# Patient Record
Sex: Female | Born: 1960 | Race: White | Hispanic: No | Smoking: Former smoker
Health system: Southern US, Community
[De-identification: ages and names within clinical notes are randomized; demographics above are authoritative.]

## PROBLEM LIST (undated history)

## (undated) DIAGNOSIS — L309 Dermatitis, unspecified: Secondary | ICD-10-CM

## (undated) DIAGNOSIS — M8580 Other specified disorders of bone density and structure, unspecified site: Secondary | ICD-10-CM

## (undated) DIAGNOSIS — R739 Hyperglycemia, unspecified: Secondary | ICD-10-CM

## (undated) DIAGNOSIS — L4050 Arthropathic psoriasis, unspecified: Secondary | ICD-10-CM

## (undated) DIAGNOSIS — B019 Varicella without complication: Secondary | ICD-10-CM

## (undated) DIAGNOSIS — Z87891 Personal history of nicotine dependence: Secondary | ICD-10-CM

## (undated) DIAGNOSIS — M67472 Ganglion, left ankle and foot: Secondary | ICD-10-CM

## (undated) DIAGNOSIS — M069 Rheumatoid arthritis, unspecified: Secondary | ICD-10-CM

## (undated) DIAGNOSIS — Z8619 Personal history of other infectious and parasitic diseases: Secondary | ICD-10-CM

## (undated) DIAGNOSIS — J209 Acute bronchitis, unspecified: Secondary | ICD-10-CM

## (undated) DIAGNOSIS — M810 Age-related osteoporosis without current pathological fracture: Secondary | ICD-10-CM

## (undated) DIAGNOSIS — B029 Zoster without complications: Secondary | ICD-10-CM

## (undated) DIAGNOSIS — B269 Mumps without complication: Secondary | ICD-10-CM

## (undated) DIAGNOSIS — M1990 Unspecified osteoarthritis, unspecified site: Secondary | ICD-10-CM

## (undated) DIAGNOSIS — I10 Essential (primary) hypertension: Secondary | ICD-10-CM

## (undated) HISTORY — PX: WISDOM TOOTH EXTRACTION: SHX21

## (undated) HISTORY — DX: Age-related osteoporosis without current pathological fracture: M81.0

## (undated) HISTORY — DX: Arthropathic psoriasis, unspecified: L40.50

## (undated) HISTORY — DX: Acute bronchitis, unspecified: J20.9

## (undated) HISTORY — DX: Zoster without complications: B02.9

## (undated) HISTORY — DX: Varicella without complication: B01.9

## (undated) HISTORY — DX: Mumps without complication: B26.9

## (undated) HISTORY — DX: Personal history of other infectious and parasitic diseases: Z86.19

## (undated) HISTORY — DX: Ganglion, left ankle and foot: M67.472

## (undated) HISTORY — PX: OTHER SURGICAL HISTORY: SHX169

## (undated) HISTORY — DX: Rheumatoid arthritis, unspecified: M06.9

## (undated) HISTORY — DX: Personal history of nicotine dependence: Z87.891

## (undated) HISTORY — DX: Dermatitis, unspecified: L30.9

## (undated) HISTORY — DX: Hyperglycemia, unspecified: R73.9

## (undated) HISTORY — DX: Morbid (severe) obesity due to excess calories: E66.01

## (undated) HISTORY — DX: Other specified disorders of bone density and structure, unspecified site: M85.80

---

## 1998-05-22 LAB — HM MAMMOGRAPHY

## 2000-08-27 ENCOUNTER — Ambulatory Visit (HOSPITAL_COMMUNITY): Admission: RE | Admit: 2000-08-27 | Discharge: 2000-08-27 | Payer: Self-pay | Admitting: Internal Medicine

## 2000-08-27 ENCOUNTER — Encounter: Payer: Self-pay | Admitting: Internal Medicine

## 2000-10-24 ENCOUNTER — Other Ambulatory Visit: Admission: RE | Admit: 2000-10-24 | Discharge: 2000-10-24 | Payer: Self-pay | Admitting: Gynecology

## 2000-11-15 ENCOUNTER — Encounter: Admission: RE | Admit: 2000-11-15 | Discharge: 2000-11-15 | Payer: Self-pay | Admitting: Neurosurgery

## 2000-11-15 ENCOUNTER — Encounter: Payer: Self-pay | Admitting: Neurosurgery

## 2000-12-03 ENCOUNTER — Encounter: Payer: Self-pay | Admitting: Neurosurgery

## 2000-12-03 ENCOUNTER — Encounter: Admission: RE | Admit: 2000-12-03 | Discharge: 2000-12-03 | Payer: Self-pay | Admitting: Neurosurgery

## 2000-12-17 ENCOUNTER — Encounter: Admission: RE | Admit: 2000-12-17 | Discharge: 2000-12-17 | Payer: Self-pay | Admitting: Neurosurgery

## 2000-12-17 ENCOUNTER — Encounter: Payer: Self-pay | Admitting: Neurosurgery

## 2001-03-11 ENCOUNTER — Ambulatory Visit (HOSPITAL_COMMUNITY): Admission: RE | Admit: 2001-03-11 | Discharge: 2001-03-11 | Payer: Self-pay | Admitting: Internal Medicine

## 2001-03-11 ENCOUNTER — Encounter: Payer: Self-pay | Admitting: Internal Medicine

## 2001-05-22 HISTORY — PX: BACK SURGERY: SHX140

## 2001-12-18 ENCOUNTER — Other Ambulatory Visit: Admission: RE | Admit: 2001-12-18 | Discharge: 2001-12-18 | Payer: Self-pay | Admitting: Gynecology

## 2002-05-27 ENCOUNTER — Ambulatory Visit (HOSPITAL_COMMUNITY): Admission: RE | Admit: 2002-05-27 | Discharge: 2002-05-27 | Payer: Self-pay | Admitting: *Deleted

## 2002-05-27 ENCOUNTER — Encounter (INDEPENDENT_AMBULATORY_CARE_PROVIDER_SITE_OTHER): Payer: Self-pay | Admitting: *Deleted

## 2003-03-09 ENCOUNTER — Encounter: Admission: RE | Admit: 2003-03-09 | Discharge: 2003-03-09 | Payer: Self-pay | Admitting: Internal Medicine

## 2003-03-09 ENCOUNTER — Encounter: Payer: Self-pay | Admitting: Internal Medicine

## 2003-05-14 ENCOUNTER — Encounter: Admission: RE | Admit: 2003-05-14 | Discharge: 2003-05-14 | Payer: Self-pay | Admitting: Orthopedic Surgery

## 2003-05-27 ENCOUNTER — Encounter: Admission: RE | Admit: 2003-05-27 | Discharge: 2003-05-27 | Payer: Self-pay | Admitting: Orthopedic Surgery

## 2003-06-18 ENCOUNTER — Encounter: Admission: RE | Admit: 2003-06-18 | Discharge: 2003-06-18 | Payer: Self-pay | Admitting: Orthopedic Surgery

## 2005-05-22 LAB — HM PAP SMEAR

## 2006-02-16 ENCOUNTER — Ambulatory Visit: Payer: Self-pay | Admitting: Internal Medicine

## 2006-05-13 ENCOUNTER — Emergency Department (HOSPITAL_COMMUNITY): Admission: EM | Admit: 2006-05-13 | Discharge: 2006-05-13 | Payer: Self-pay | Admitting: Emergency Medicine

## 2006-05-14 ENCOUNTER — Observation Stay (HOSPITAL_COMMUNITY): Admission: RE | Admit: 2006-05-14 | Discharge: 2006-05-16 | Payer: Self-pay | Admitting: Neurosurgery

## 2006-06-12 ENCOUNTER — Encounter: Admission: RE | Admit: 2006-06-12 | Discharge: 2006-06-12 | Payer: Self-pay | Admitting: Neurosurgery

## 2006-10-17 ENCOUNTER — Ambulatory Visit: Payer: Self-pay | Admitting: Internal Medicine

## 2007-04-24 ENCOUNTER — Telehealth (INDEPENDENT_AMBULATORY_CARE_PROVIDER_SITE_OTHER): Payer: Self-pay | Admitting: *Deleted

## 2007-04-25 ENCOUNTER — Ambulatory Visit: Payer: Self-pay | Admitting: Internal Medicine

## 2007-04-25 ENCOUNTER — Encounter: Payer: Self-pay | Admitting: Internal Medicine

## 2007-04-25 DIAGNOSIS — M545 Low back pain, unspecified: Secondary | ICD-10-CM | POA: Insufficient documentation

## 2007-04-25 DIAGNOSIS — M069 Rheumatoid arthritis, unspecified: Secondary | ICD-10-CM

## 2007-04-25 DIAGNOSIS — M8560 Other cyst of bone, unspecified site: Secondary | ICD-10-CM | POA: Insufficient documentation

## 2007-06-23 ENCOUNTER — Encounter: Admission: RE | Admit: 2007-06-23 | Discharge: 2007-06-23 | Payer: Self-pay | Admitting: Neurosurgery

## 2007-07-12 ENCOUNTER — Telehealth: Payer: Self-pay | Admitting: Internal Medicine

## 2007-07-12 ENCOUNTER — Ambulatory Visit: Payer: Self-pay | Admitting: Internal Medicine

## 2007-07-12 DIAGNOSIS — M26609 Unspecified temporomandibular joint disorder, unspecified side: Secondary | ICD-10-CM | POA: Insufficient documentation

## 2007-11-21 ENCOUNTER — Telehealth: Payer: Self-pay | Admitting: Internal Medicine

## 2008-04-20 ENCOUNTER — Telehealth (INDEPENDENT_AMBULATORY_CARE_PROVIDER_SITE_OTHER): Payer: Self-pay | Admitting: *Deleted

## 2008-05-28 ENCOUNTER — Encounter: Payer: Self-pay | Admitting: Internal Medicine

## 2009-01-19 ENCOUNTER — Telehealth: Payer: Self-pay | Admitting: Internal Medicine

## 2009-06-25 ENCOUNTER — Encounter: Payer: Self-pay | Admitting: Internal Medicine

## 2009-07-08 ENCOUNTER — Encounter: Payer: Self-pay | Admitting: Internal Medicine

## 2009-08-25 ENCOUNTER — Encounter: Payer: Self-pay | Admitting: Internal Medicine

## 2009-08-31 ENCOUNTER — Ambulatory Visit: Payer: Self-pay | Admitting: Internal Medicine

## 2009-08-31 DIAGNOSIS — F418 Other specified anxiety disorders: Secondary | ICD-10-CM

## 2009-08-31 DIAGNOSIS — F4310 Post-traumatic stress disorder, unspecified: Secondary | ICD-10-CM | POA: Insufficient documentation

## 2009-08-31 DIAGNOSIS — H8109 Meniere's disease, unspecified ear: Secondary | ICD-10-CM | POA: Insufficient documentation

## 2009-08-31 HISTORY — DX: Morbid (severe) obesity due to excess calories: E66.01

## 2009-09-01 ENCOUNTER — Telehealth: Payer: Self-pay | Admitting: Internal Medicine

## 2009-10-08 ENCOUNTER — Ambulatory Visit: Payer: Self-pay | Admitting: Internal Medicine

## 2009-10-08 DIAGNOSIS — Z87891 Personal history of nicotine dependence: Secondary | ICD-10-CM | POA: Insufficient documentation

## 2009-10-08 HISTORY — DX: Personal history of nicotine dependence: Z87.891

## 2009-10-14 ENCOUNTER — Telehealth: Payer: Self-pay | Admitting: Internal Medicine

## 2009-12-16 ENCOUNTER — Telehealth: Payer: Self-pay | Admitting: Internal Medicine

## 2009-12-16 ENCOUNTER — Ambulatory Visit: Payer: Self-pay | Admitting: Internal Medicine

## 2010-06-19 LAB — CONVERTED CEMR LAB
Cholesterol: 179 mg/dL (ref 0–200)
HDL: 57.7 mg/dL (ref >39.00)
LDL Cholesterol: 90 mg/dL (ref 0–99)
TSH: 0.51 microintl units/mL (ref 0.35–5.50)
Total CHOL/HDL Ratio: 3
Triglycerides: 157 mg/dL — ABNORMAL HIGH (ref 0.0–149.0)
VLDL: 31.4 mg/dL (ref 0.0–40.0)

## 2010-06-21 NOTE — Letter (Signed)
Summary: [facility]  [facility]   Imported By: Sherian Rein 07/23/2009 07:45:57  _____________________________________________________________________  External Attachment:    Type:   Image     Comment:   External Document
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
## 2010-06-21 NOTE — Progress Notes (Signed)
  Phone Note Call from Patient Call back at Work Phone [phone] Call back at [phone]    Summary of Call: Pt c/o exhaustion since increase in BP medication.  Initial call taken by: Lamar Sprinkles, CMA,  Oct 14, 2009 5:38 PM  Follow-up for Phone Call        unusual for diltiazem to cause fatigue but possible. Stop med and see if symptoms improve.  Follow-up by: Jacques Navy MD,  Oct 14, 2009 6:43 PM  Additional Follow-up for Phone Call Additional follow up Details #1::        left vm on cell # Additional Follow-up by: Lamar Sprinkles, CMA,  Oct 15, 2009 8:42 AM

## 2010-06-21 NOTE — Assessment & Plan Note (Signed)
Summary: tick bite/swollen, red, hot/SD   Vital Signs:  Patient profile:   50 year old female Height:      69 inches Weight:      242 pounds BMI:     35.87 O2 Sat:      97 % on Room air Temp:     97.8 degrees F oral Pulse rate:   71 / minute BP sitting:   138 / 90  (left arm) Cuff size:   regular  Vitals Entered By: Bill Salinas CMA (December 16, 2009 4:34 PM)  O2 Flow:  Room air CC: pt here for evaluation of tick bite behind right knee/ ab   Primary Care Provider:  Wasyl Dornfeld  CC:  pt here for evaluation of tick bite behind right knee/ ab.  History of Present Illness: patient presents for evaluation of an area of erythema at the site of a tick bite. she reports finding a tick on her leg. By her report the tick had not fed. She feel she removed the entire tick. Several days latter there is an area of erythema at the bit site that is solid red without a cleared center. The area is not painful. She has had no fever, headache, myalgias.   Current Medications (verified): 1)  Hydrochlorothiazide 25 Mg Tabs (Hydrochlorothiazide) .... Take 1 Tablet By Mouth Once A Day 2)  Plaquenil 200 Mg  Tabs (Hydroxychloroquine Sulfate) .... Take 1 Tablet By Mouth Two Times A Day 3)  Vicodin 5-500 Mg  Tabs (Hydrocodone-Acetaminophen) .... As Needed 4)  Humira Inj .... Weekly 5)  Ventolin Hfa 108 (90 Base) Mcg/act Aers (Albuterol Sulfate) .... 2 Puffs Q 4 Hrs As Needed 6)  Sulfazine 500 Mg Tabs (Sulfasalazine) .... 2 Tab By Mouth Once Daily 7)  Cartia Xt 240 Mg Xr24h-Cap (Diltiazem Hcl Coated Beads) .Marland Kitchen.. 1 By Mouth Once Daily 8)  Citalopram Hydrobromide 20 Mg Tabs (Citalopram Hydrobromide) .Marland Kitchen.. 1 By Mouth Once Daily For Anxiety and Depression  Allergies (verified): No Known Drug Allergies PMH-FH-SH reviewed-no changes except otherwise noted  Review of Systems  The patient denies anorexia, fever, weight loss, vision loss, chest pain, dyspnea on exertion, abdominal pain, severe indigestion/heartburn,  muscle weakness, enlarged lymph nodes, and angioedema.    Physical Exam  General:  Heavyset white female in no distress Skin:  3cm diabeter area of erythema left leg at tick bite site. With hand lens the wound is examined and ther is no apparent retained foreign body.   Impression & Recommendations:  Problem # 1:  CELLULITIS AND ABSCESS OF LEG EXCEPT FOOT (ICD-682.6) local inflammation without other signs or symptoms to suggest tick born disease.  Plan - doxycycline 100mg  two times a days x 7 days.   Her updated medication list for this problem includes:    Doxycycline Hyclate 100 Mg Caps (Doxycycline hyclate) .Marland Kitchen... 1 by mouth two times a day  Complete Medication List: 1)  Hydrochlorothiazide 25 Mg Tabs (Hydrochlorothiazide) .... Take 1 tablet by mouth once a day 2)  Plaquenil 200 Mg Tabs (Hydroxychloroquine sulfate) .... Take 1 tablet by mouth two times a day 3)  Vicodin 5-500 Mg Tabs (Hydrocodone-acetaminophen) .... As needed 4)  Humira Inj  .... Weekly 5)  Ventolin Hfa 108 (90 Base) Mcg/act Aers (Albuterol sulfate) .... 2 puffs q 4 hrs as needed 6)  Sulfazine 500 Mg Tabs (Sulfasalazine) .... 2 tab by mouth once daily 7)  Cartia Xt 240 Mg Xr24h-cap (Diltiazem hcl coated beads) .Marland Kitchen.. 1 by mouth once daily 8)  Citalopram Hydrobromide 20 Mg Tabs (Citalopram hydrobromide) .Marland Kitchen.. 1 by mouth once daily for anxiety and depression 9)  Doxycycline Hyclate 100 Mg Caps (Doxycycline hyclate) .Marland Kitchen.. 1 by mouth two times a day Prescriptions: DOXYCYCLINE HYCLATE 100 MG CAPS (DOXYCYCLINE HYCLATE) 1 by mouth two times a day  #14 x 0   Entered and Authorized by:   Jacques Navy MD   Signed by:   Jacques Navy MD on 12/16/2009   Method used:   Electronically to        [facility]. [phone]* (retail)       [address]       [place], [place]  [address]       Ph: [phone]       Fax: [phone]   RxID:   [id number]

## 2010-06-21 NOTE — Progress Notes (Signed)
Summary: rx  Phone Note Call from Patient   Summary of Call: Patient left message on triage that prescriptions from office visit were not at her pharmacy. Left message on voicemail notifying prescriptions have been taken care of. Initial call taken by: Lucious Groves,  September 01, 2009 9:59 AM

## 2010-06-21 NOTE — Letter (Signed)
Summary: [facility]  [facility]   Imported By: Sherian Rein 07/23/2009 07:47:23  _____________________________________________________________________  External Attachment:    Type:   Image     Comment:   External Document

## 2010-06-21 NOTE — Progress Notes (Signed)
  Phone Note Call from Patient Call back at [phone]   Summary of Call: Pt found embedded tick approx 2 wks ago. Last 3 to 4 days she is c/o swelling & dark pigment to bite area. Pt scheduled Initial call taken by: Lamar Sprinkles, CMA,  December 16, 2009 4:08 PM

## 2010-09-08 ENCOUNTER — Other Ambulatory Visit: Payer: Self-pay | Admitting: Internal Medicine

## 2010-10-07 NOTE — Assessment & Plan Note (Signed)
[facility]                                 ON-CALL NOTE   NAME:[name], Jennifer Kelly                       MRN:          [id number]  DATE:05/12/2006                            DOB:          May 06, 1961    [phone].  Patient of Dr. Debby Bud.  Called at 3:10 p.m.  Jennifer Kelly has a  pinched nerve, saw Dr. Thurston Hole, apparently was prescribed hydrocodone and  oxycodone and also methocarbamol, none of that is working.  She is  supposedly going to see Dr. Newell Coral after the first of the year, but  she is in severe pain and does not know what to do, and asks for advice.   PLAN:  I told her that the emergency room is going to be her only option  at this time, that there was nothing I could do over the phone for sure,  and she understood that.  That had actually asked whether that was her  best option.  I did tell her yes, and she is going to have someone bring  her over there.     Karie Schwalbe, MD  Electronically Signed    RIL/MedQ  DD: 05/12/2006  DT: 05/13/2006  Job #: [id number]   cc:   Rosalyn Gess. Norins, MD

## 2010-10-07 NOTE — Op Note (Signed)
NAMECIGI, BEGA                ACCOUNT NO.:  [id number]   MEDICAL RECORD NO.:  [id number]          PATIENT TYPE:  OIB   LOCATION:  3172                         FACILITY:  [facility]   PHYSICIAN:  Hewitt Shorts, M.D.DATE OF BIRTH:  May 11, 1961   DATE OF PROCEDURE:  05/14/2006  DATE OF DISCHARGE:                               OPERATIVE REPORT   PREOPERATIVE DIAGNOSES:  Left L4-5 lumbar disk herniation, lumbar  stenosis, lumbar spondylosis and lumbar degenerative disk disease.   POSTOPERATIVE DIAGNOSES:  Left L4-5 lumbar disk herniation, lumbar  stenosis, lumbar spondylosis and lumbar degenerative disk disease.   PROCEDURES:  Left L4-5 lumbar laminotomy, microdiskectomy.   SURGEON:  Hewitt Shorts, M.D.   ASSISTANT:  Stefani Dama, M.D.   ANESTHESIA:  General endotracheal.   INDICATIONS:  The patient is a 50 year old woman, who presented with an  acute left lumbar radiculopathy, and was found by MRI scan to have a  large left L4-5 lumbar disk herniation with significant thecal sac and  nerve root compression.  There were underlying spondylosis and  degenerative disk disease resulting in significant lumbar stenosis, and  a decision was made to proceed with lumbar laminotomy and  microdiskectomy.   DESCRIPTION OF PROCEDURES:  The patient was brought to the operating  room and placed under general endotracheal anesthesia.  The patient was  turned to a prone position.  The lumbar region was prepped with Betadine  Soap and Solution and injected in a sterile fashion.  The midline was  infiltrated with local anesthetic with epinephrine and an x-ray taken  and the L4-5 level identified.  A midline incision was made over the L4-  5 level and dissection was carried down through the subcutaneous tissue  with bipolar cautery.  Electrocautery was used to maintain hemostasis.  Dissection was carried down to the lumbar fascia, which was incised on  the left side of the midline, and  the paraspinous muscles were dissected  from the spinous processes and laminae in a subperiosteal fashion.  A  self-retaining retractor was placed, an x-ray taken, and we identified  the L4-5 interlaminar space.  The microscope was draped and brought into  the field to provide additional magnification, illumination and  visualization, and the remainder of the decompression was performed  using microdissection and microsurgical technique.  A laminotomy was  performed using the X-Max drill and Kerrison punches.  We identified the  ligamentum flavum, which was carefully dissected and removed, and we  identified the thecal sac and exiting L5 nerve root.  Initially, we had  difficulty retracting the thecal sac and nerve root medially due to a  large ventral mass.  We did a medial facetectomy and were able to  dissect lateral to the thecal sac, and we were able to gently mobilize  the thecal sac and nerve root and encountered a free fragment of disk  herniation. This fragment was removed.  We then further dissected and  removed several other large fragments of disk herniation free within the  epidural space, compressing the thecal sac and nerve roots.  Once these  free fragments were removed, we were able to more easily retract the  thecal sac and nerve root medially.  We examined the epidural space  thoroughly, and then we coagulated the overlying epidural veins and  incised the annulus and entered into the disk space and proceeded with a  thorough diskectomy using a variety of pituitary rongeurs.  In the end,  all loose fragments of disk material were removed from both the disk  space and the epidural space, and good decompression of the thecal sac  and nerve root was achieved.  The wound was irrigated on numerous  occasions through the procedure with Bacitracin solution, and then, once  hemostasis was confirmed and the diskectomy completed, we instilled 2 cc  of fentanyl and 80 mg of  Depo-Medrol into the epidural space, and then  proceeded with closure.  The deep fascia was closed with interrupted,  undyed 1 Vicryl suture, and the subcutaneous and subcuticular layer  closed with interrupted, inverted 2-0 undyed Vicryl sutures, and the  skin edges were approximated with Dermabond.  The procedure was  tolerated well, and the estimated blood loss was 25 cc.  Sponge and  needle counts were correct.  Following the procedure, the patient was  turned back to the supine position, reversed from the anesthetic and  extubated, to be transferred to the recovery room for further care.      Hewitt Shorts, M.D.  Electronically Signed     RWN/MEDQ  D:  05/14/2006  T:  05/14/2006  Job:  [id number]   cc:   Stefani Dama, M.D.

## 2010-10-07 NOTE — H&P (Signed)
   NAME:  Jennifer Kelly, Jennifer Kelly                          ACCOUNT NO.:  [id number]   MEDICAL RECORD NO.:  [id number]                   PATIENT TYPE:  AMB   LOCATION:  SDC                                  [facility]ACILITY:  [facility]   PHYSICIAN:  Haralson B. Earlene Plater, M.D.               DATE OF BIRTH:  [birth year]   DATE OF ADMISSION:  DATE OF DISCHARGE:                                HISTORY & PHYSICAL   ADMISSION DIAGNOSIS:  Termination and sterilization.   INTENDED PROCEDURE:  Suction curettage and laparoscopic tubal ligation.   HISTORY OF PRESENT ILLNESS:  This is a 50 year old, white female, G2, P0,  A1, LMP April 05, 2002, status post unintentional pregnancy despite use  of barrier methods of contraception who presents for termination and also  desires permanent sterilization.   PAST MEDICAL HISTORY:  Depression.   PAST SURGICAL HISTORY:  1. TAB x1.  2. Shoulder surgery.   MEDICATIONS:  1. Celexa.  2. Occasional hydrochlorothiazide for swelling.   ALLERGIES:  No known drug allergies.   SOCIAL HISTORY:  Greater than one pack per day smoker.  Occasional alcohol.  No other drug use.   FAMILY HISTORY:  Diabetes.  Hypertension.   REVIEW OF SYMPTOMS:  Noncontributory.   PHYSICAL EXAMINATION:  VITAL SIGNS:  Blood pressure 120/90, pulse 88,  respirations 16.  GENERAL:  Alert and oriented in no acute distress.  SKIN:  Warm, dry with no lesions.  HEART:  Regular rate and rhythm.  LUNGS:  Clear to auscultation.  ABDOMEN:  No hernia.  PELVIC:  Normal external genitalia.  Vagina and cervix normal.  Uterus is  retroverted and consistent with age.  No adnexal masses or tenderness noted.    ASSESSMENT:  Desires elective termination of pregnancy with permanent  sterilization.  Irreversibility of the procedure was discussed.  Failure  rate was discussed.  Potential increased risk of ectopic pregnancy if  pregnancy occurs.  Operative risks discussed including infection, bleeding,  uterine perforation,  damage to bowel, bladder and surrounding organs.  All  questions were answered and the patient wished to proceed.                                               Gerri Spore B. Earlene Plater, M.D.    WBD/MEDQ  D:  05/26/2002  T:  05/26/2002  Job:  [id number]

## 2010-10-07 NOTE — Op Note (Signed)
NAME:  Jennifer Kelly, Jennifer Kelly                          ACCOUNT NO.:  [id number]   MEDICAL RECORD NO.:  [id number]                   PATIENT TYPE:  AMB   LOCATION:  SDC                                  [facility]ACILITY:  [facility]   PHYSICIAN:  Conyers B. Earlene Plater, M.D.               DATE OF BIRTH:  November 06, 1960   DATE OF PROCEDURE:  05/27/2002  DATE OF DISCHARGE:                                 OPERATIVE REPORT   PREOPERATIVE DIAGNOSES:  1. Undesired pregnancy.  2. Undesired fertility.   POSTOPERATIVE DIAGNOSES:  1. Undesired pregnancy.  2. Undesired fertility.   PROCEDURE:  Suction curettage and __________ laparoscopy with tubal  ligation, bipolar cautery.   SURGEON:  Chester Holstein. Earlene Plater, M.D.   ANESTHESIA:  General.   FINDINGS:  A 7-8 week sized mid position uterus on EUA.  Normal appearing  uterus, tubes, and ovary on laparoscopy.   ESTIMATED BLOOD LOSS:  100.   COMPLICATIONS:  None.   SPECIMENS:  Uterine contents.   INDICATIONS:  The patient desires termination of pregnancy at 7-8 weeks by  menstrual dates and also desires tubal sterilization.  Permanency, failure  rates, operative risks, ectopic risks all discussed prior to procedure.   PROCEDURE:  The patient taken to the operating room and general anesthesia  obtained.  She was placed in the ski position and prepped and draped in a  standard fashion.  The bladder was emptied with a red rubber catheter.  Examination under anesthesia showed the above findings.  Speculum was  inserted and the anterior lip of the cervix grasped with a tooth tenaculum.  A paracervical block was placed with 10 cubic centimeters 1% lidocaine  standard fashion.  The cervix was gently dilated to a number 23 without  difficulty.  A number 7 suction cannula was then inserted, suction obtained,  and moderate amount of products returned.  Two more passes until no tissue  returned.  The uterus was gently curetted and a gritty texture noted.   Gowns and gloves were  changed.  Attention turned to the abdomen.  A 10 mm  vertical infraumbilical skin fold incision was made with the knife.  The  dissection was carried sharply to the underlying fascia.  The fascia was  elevated with Kocher clamps and divided sharply.  Posterior sheath and  peritoneum were elevated with an Allis clamp and entered sharply with a  knife.  Pursestring suture of 0 Vicryl was placed around the fascial defect.  The Hasson cannula inserted and secured.   Pneumoperitoneum was obtained with CO2 gas.  Trendelenburg position  obtained.  Bowel immobilized superiorly with the bowel probe.  One adhesion  from the descending colon to the left pelvic side wall was noted.  The  uterus was elevated with a Hulka tenaculum.  The right tube identified,  followed to its fimbriated end.  It was subsequently cauterized with bipolar  cautery in triple burn  fashion for 3-4 cm segment starting approximately 2  cm distal to the uterine cornu.  Repeated on the opposite side in a similar  fashion.   Scope was removed.  Gas released.  Hasson cannula removed.  I inserted my  index finger through the fascial defect and snugged the previously placed  pursestring suture.  This obliterated the fascial defect and no intra-  abdominal contents herniated through prior to closure.  Skin was closed with  running 4-0 Vicryl.   Attention was turned to the vagina momentarily and the uterus gently  curetted one additional time and again a gritty texture noted.  Therefore,  the procedure was terminated.  The cervix was hemostatic.   The patient tolerated procedure well.  No complications.  First taken to  recovery awake, alert, in stable condition.                                               Gerri Spore B. Earlene Plater, M.D.    WBD/MEDQ  D:  05/27/2002  T:  05/27/2002  Job:  [id number]

## 2010-10-14 ENCOUNTER — Other Ambulatory Visit: Payer: Self-pay | Admitting: Internal Medicine

## 2010-10-28 ENCOUNTER — Other Ambulatory Visit: Payer: Self-pay | Admitting: Internal Medicine

## 2011-03-28 ENCOUNTER — Other Ambulatory Visit: Payer: Self-pay | Admitting: Internal Medicine

## 2011-08-08 ENCOUNTER — Other Ambulatory Visit: Payer: Self-pay | Admitting: *Deleted

## 2011-08-08 MED ORDER — ALBUTEROL SULFATE HFA 108 (90 BASE) MCG/ACT IN AERS: 2.0000 | INHALATION_SPRAY | RESPIRATORY_TRACT | Status: DC | PRN

## 2011-08-21 ENCOUNTER — Other Ambulatory Visit: Payer: Self-pay | Admitting: Sports Medicine

## 2011-08-21 DIAGNOSIS — M542 Cervicalgia: Secondary | ICD-10-CM

## 2011-08-28 ENCOUNTER — Ambulatory Visit
Admission: RE | Admit: 2011-08-28 | Discharge: 2011-08-28 | Disposition: A | Discharge status: Home / Self Care | Payer: 59 | Source: Ambulatory Visit | Attending: Sports Medicine | Admitting: Sports Medicine

## 2011-08-28 DIAGNOSIS — M542 Cervicalgia: Secondary | ICD-10-CM

## 2011-09-10 ENCOUNTER — Other Ambulatory Visit: Payer: Self-pay | Admitting: Internal Medicine

## 2011-10-13 ENCOUNTER — Other Ambulatory Visit: Payer: Self-pay | Admitting: Internal Medicine

## 2011-10-17 ENCOUNTER — Other Ambulatory Visit: Payer: Self-pay | Admitting: *Deleted

## 2011-10-17 MED ORDER — CITALOPRAM HYDROBROMIDE 20 MG PO TABS: 20.0000 mg | ORAL_TABLET | Freq: Every day | ORAL | Status: DC

## 2011-10-17 NOTE — Telephone Encounter (Signed)
Rx sent to [facility] . Patient needs office visit for further refills.

## 2011-10-18 ENCOUNTER — Other Ambulatory Visit: Payer: Self-pay | Admitting: Internal Medicine

## 2011-10-20 ENCOUNTER — Other Ambulatory Visit: Payer: Self-pay | Admitting: *Deleted

## 2011-10-20 MED ORDER — CITALOPRAM HYDROBROMIDE 20 MG PO TABS: 20.0000 mg | ORAL_TABLET | Freq: Every day | ORAL | Status: DC

## 2011-11-13 ENCOUNTER — Other Ambulatory Visit: Payer: Self-pay | Admitting: Internal Medicine

## 2011-12-12 ENCOUNTER — Other Ambulatory Visit: Payer: Self-pay | Admitting: Internal Medicine

## 2011-12-13 ENCOUNTER — Other Ambulatory Visit: Payer: Self-pay | Admitting: *Deleted

## 2011-12-13 MED ORDER — CITALOPRAM HYDROBROMIDE 20 MG PO TABS: 20.0000 mg | ORAL_TABLET | Freq: Every day | ORAL | Status: DC

## 2011-12-13 MED ORDER — DILTIAZEM HCL ER COATED BEADS 240 MG PO CP24: 240.0000 mg | ORAL_CAPSULE | Freq: Every day | ORAL | Status: DC

## 2011-12-13 NOTE — Telephone Encounter (Signed)
REFILLS CARDIAZAM AND CELEXA SENT TO [facility]. PATIENT NEED APPT. FOR FURTHER REFILLLS

## 2012-01-12 ENCOUNTER — Other Ambulatory Visit: Payer: Self-pay | Admitting: Internal Medicine

## 2012-01-13 ENCOUNTER — Other Ambulatory Visit: Payer: Self-pay | Admitting: Internal Medicine

## 2012-01-15 NOTE — Telephone Encounter (Signed)
Patient aware of medication sent to [facility] and her appt. On Sept 4th 3 pm

## 2012-01-15 NOTE — Telephone Encounter (Signed)
PT HAS AN APPT ON SEPT 4.  CAN SHE HAVE A REFILL TO LAST UNTIL THEM.  IT IS FOR BP MEDS AND CITALOPRAM.

## 2012-01-24 ENCOUNTER — Ambulatory Visit: Payer: 59 | Admitting: Internal Medicine

## 2012-01-31 ENCOUNTER — Ambulatory Visit (INDEPENDENT_AMBULATORY_CARE_PROVIDER_SITE_OTHER): Payer: 59 | Admitting: Internal Medicine

## 2012-01-31 ENCOUNTER — Encounter: Payer: Self-pay | Admitting: Internal Medicine

## 2012-01-31 VITALS — BP 132/92 | HR 76 | Temp 98.2°F | Resp 16 | Wt 233.0 lb

## 2012-01-31 DIAGNOSIS — F331 Major depressive disorder, recurrent, moderate: Secondary | ICD-10-CM

## 2012-01-31 DIAGNOSIS — E669 Obesity, unspecified: Secondary | ICD-10-CM

## 2012-01-31 DIAGNOSIS — M069 Rheumatoid arthritis, unspecified: Secondary | ICD-10-CM

## 2012-01-31 DIAGNOSIS — Z0000 Encounter for general adult medical examination without abnormal findings: Secondary | ICD-10-CM

## 2012-01-31 DIAGNOSIS — M545 Low back pain: Secondary | ICD-10-CM

## 2012-01-31 MED ORDER — TEMAZEPAM 15 MG PO CAPS: 15.0000 mg | ORAL_CAPSULE | Freq: Every evening | ORAL | Status: DC | PRN

## 2012-01-31 MED ORDER — DILTIAZEM HCL ER COATED BEADS 240 MG PO CP24: 240.0000 mg | ORAL_CAPSULE | Freq: Every day | ORAL | Status: DC

## 2012-01-31 MED ORDER — HYDROCHLOROTHIAZIDE 25 MG PO TABS: 25.0000 mg | ORAL_TABLET | Freq: Every day | ORAL | Status: DC

## 2012-01-31 MED ORDER — CITALOPRAM HYDROBROMIDE 20 MG PO TABS: 20.0000 mg | ORAL_TABLET | Freq: Every day | ORAL | Status: DC

## 2012-01-31 MED ORDER — ALBUTEROL SULFATE HFA 108 (90 BASE) MCG/ACT IN AERS: 2.0000 | INHALATION_SPRAY | Freq: Four times a day (QID) | RESPIRATORY_TRACT | Status: DC | PRN
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
## 2012-02-01 DIAGNOSIS — Z0000 Encounter for general adult medical examination without abnormal findings: Secondary | ICD-10-CM | POA: Insufficient documentation

## 2012-02-01 NOTE — Assessment & Plan Note (Addendum)
Followed by Dr. Dareen Piano. She seems to be doing well - good mobility and able to manage all ADLs. She is tolerating her medical regimen. Routine labs are done by Dr. Dareen Piano. She reports marked degenerative disease right shoulder - down the road she may need total arthroplasty but at this time she is getting along with intermittent steroid injections.

## 2012-02-01 NOTE — Assessment & Plan Note (Signed)
Chronic low back pain but she is independent in all ADLs. On Butran patch and breakthrough percocet from other doctors. Very reliable patient. She is followed by Dr. Newell Coral - she reports he feels lumbar spine is stable but she may have cervical spine problems. She has had cervical ESI

## 2012-02-01 NOTE — Assessment & Plan Note (Signed)
Interval medical history is stable. Limited physical exam is normal. Labs are followed by Dr. Dareen Piano. Last lipid panel May '11 with HDL 57.7, LDL 90 - no repeat lab needed at this time. She is overdue for gyn exam and mammogram. She is due for colonoscopy. No immunizations in EPIC and she is a candidate for Tdap and pneumonia vaccine - she may return for these at her convenience.  In summary - a charming woman who has multiple medical problems but does appear stable at this time. She needs to obtain immunizations and to schedule gyn exam. Otherwise she will return as needed or in one year.

## 2012-02-01 NOTE — Assessment & Plan Note (Signed)
BMI 34.4 but better than previous. She has had some weight flucuations but overall has been managing pretty well. Her goal is to loose an additional 30 lbs.  Plan - weight management: smart food choices, PORTION SIZE CONTROL, regular low impact aerobic exercise - 30 minutes 3 times a week minimum

## 2012-02-01 NOTE — Assessment & Plan Note (Signed)
She has good days and bad days but is never suicidal. She gets good results with celexa. She is in a stable marriage which is helpful.  Plan Rx renewed for celexa.

## 2012-05-13 ENCOUNTER — Telehealth: Payer: Self-pay | Admitting: Internal Medicine

## 2012-05-13 NOTE — Telephone Encounter (Signed)
Called pt and she stated that she is going to go to an Urgent Care this evening. I apologized that we didn't have any availability, pt understood.

## 2012-05-13 NOTE — Telephone Encounter (Signed)
Patient Information:  Caller Name: Hazley  Phone: [phone]  Patient: Jennifer Kelly, Jennifer Kelly  Gender: Female  DOB: 1960/08/29  Age: 51 Years  PCP: Illene Regulus (Adults only)  Pregnant: No  Office Follow Up:  Does the office need to follow up with this patient?: Yes  Instructions For The Office: No appointments remain; please call back to advise if can be worked in for 05/13/12 or will go to Urgent Care.  RN Note:  Advised to start using Albuterol MDI every 4 hours, hydrate and humidify.  Stop using AlkaSeltzer Cold and Cough.  Plans to go to Urgent Care after work 05/13/12 if cannot be worked in for appoinment 05/13/12.  Needs 30 minutes notice to get to office.  Symptoms  Reason For Call & Symptoms: Antibiotic for "bronchitis."  Feels poorly with fatigue, productive cough and mild wheezing.  Reviewed Health History In EMR: Yes  Reviewed Medications In EMR: Yes  Reviewed Allergies In EMR: Yes  Reviewed Surgeries / Procedures: Yes  Date of Onset of Symptoms: 05/11/2012  Treatments Tried: Albuterol QID, cough drops, AlkaSeltzer Cold and Flu  Treatments Tried Worked: Yes OB / GYN:  LMP: 01/21/2011  Guideline(s) Used:  Asthma Attack  Disposition Per Guideline:   See Today in Office  Reason For Disposition Reached:   Patient wants to be seen  Advice Given:  Quick-Relief Asthma Medicine:   Start your quick-relief medicine (e.g., albuterol, salbutamol) at the first sign of any coughing or shortness of breath (don't wait for wheezing). Use your inhaler (2 puffs each time) or nebulizer every 4 hours. Continue the quick-relief medicine until you have not wheezed or coughed for 48 hours.  The best "cough medicine" for an adult with asthma is always the asthma medicine (Note: Don't use cough suppressants, but cough drops may help a tickly cough).  Drinking Liquids:  Try to drink normal amount of liquids (e.g., water). Being adequately hydrated makes it easier to cough up the sticky lung  mucus.  Humidifier:   If the air is dry, use a cool mist humidifier to prevent drying of the upper airway.

## 2012-05-22 DIAGNOSIS — B029 Zoster without complications: Secondary | ICD-10-CM

## 2012-05-22 HISTORY — PX: TOOTH EXTRACTION: SUR596

## 2012-05-22 HISTORY — DX: Zoster without complications: B02.9

## 2012-06-03 ENCOUNTER — Ambulatory Visit (INDEPENDENT_AMBULATORY_CARE_PROVIDER_SITE_OTHER): Payer: Commercial Indemnity | Admitting: Internal Medicine

## 2012-06-03 ENCOUNTER — Encounter: Payer: Self-pay | Admitting: Internal Medicine

## 2012-06-03 VITALS — BP 162/88 | HR 81 | Temp 97.8°F | Resp 12 | Wt 225.1 lb

## 2012-06-03 DIAGNOSIS — I10 Essential (primary) hypertension: Secondary | ICD-10-CM

## 2012-06-03 DIAGNOSIS — F331 Major depressive disorder, recurrent, moderate: Secondary | ICD-10-CM

## 2012-06-03 MED ORDER — LOSARTAN POTASSIUM-HCTZ 50-12.5 MG PO TABS
1.0000 | ORAL_TABLET | Freq: Every day | ORAL | Status: DC
Start: 2012-06-03 — End: 2012-10-05

## 2012-06-03 MED ORDER — BUSPIRONE HCL 15 MG PO TABS: 15.0000 mg | ORAL_TABLET | Freq: Three times a day (TID) | ORAL | Status: DC

## 2012-06-03 NOTE — Patient Instructions (Addendum)
Blood pressure - history of elevated BP now worse, multifactorial.  Plan Continue cardiazem 240 mg  Start losartan/hct 50/12.5 and stop the plain Hctz  F/u office visit and lab in 1 month  Anxiety and depression - many stressors Plan Recommend counseling, short term to deal with some of the stress: Judithe Modest, MSW. Call [phone] to schedule an appointment  Continue the Celxa  Will add BuSpar -comes as a 15 mg tab that is scored. Start with 5 mg three times a day x 1 week, then 7.5 mg three times a day for 2 week and if         Anxiety is not controlled you can advance to 15 mg three times a day.

## 2012-06-03 NOTE — Progress Notes (Signed)
  Subjective:    Patient ID: Jennifer Kelly, female    DOB: 03-Jun-1960, 52 y.o.   MRN: [id number]  HPI Jennifer Kelly presents for evaluation of blood pressure elevations. Reviewed chart back to '09 and she has a h/o intermittent elevations. She is under more stress than usual at home and work (going through a divorce). She also continues to deal with the pain of RA.  She does report that she has increased anxiety with literal shaking spells. No chest  Pain or discomfort, no breathing trouble.  History reviewed. No pertinent past medical history. Past Surgical History  Procedure Date  . Tooth extraction 05/2012   History reviewed. No pertinent family history. History   Social History  . Marital Status: Married    Spouse Name: N/A    Number of Children: N/A  . Years of Education: N/A   Occupational History  . Not on file.   Social History Main Topics  . Smoking status: Current Every Day Smoker  . Smokeless tobacco: Not on file  . Alcohol Use: Not on file  . Drug Use: Not on file  . Sexually Active: Not on file   Other Topics Concern  . Not on file   Social History Narrative  . No narrative on file    Current Outpatient Prescriptions on File Prior to Visit  Medication Sig Dispense Refill  . albuterol (VENTOLIN HFA) 108 (90 BASE) MCG/ACT inhaler Inhale 2 puffs into the lungs every 6 (six) hours as needed for wheezing.  18 g  3  . buprenorphine (BUTRANS) 20 MCG/HR PTWK Place 20 mcg onto the skin once a week.      . citalopram (CELEXA) 20 MG tablet Take 1 tablet (20 mg total) by mouth daily.  30 tablet  11  . diltiazem (CARDIZEM CD) 240 MG 24 hr capsule Take 1 capsule (240 mg total) by mouth daily.  30 capsule  11  . hydrochlorothiazide (HYDRODIURIL) 25 MG tablet Take 1 tablet (25 mg total) by mouth daily.  30 tablet  11  . oxyCODONE-acetaminophen (PERCOCET) 10-325 MG per tablet Take  1 to 2 tablets q 4-6 hours daily      . temazepam (RESTORIL) 15 MG capsule Take 15 mg by mouth at  bedtime as needed.      . Tofacitinib Citrate (XELJANZ) 5 MG TABS Take 2 tablets by mouth daily.          Review of Systems System review is negative for any constitutional, cardiac, pulmonary, GI or neuro symptoms or complaints other than as described in the HPI.     Objective:   Physical Exam Filed Vitals:   06/03/12 1506  BP: 162/88  Pulse: 81  Temp: 97.8 F (36.6 C)  Resp: 12   Gen'l  Heavyset white woman in no distress HEENT- pinpoint pupils- cannot visualize fundi Neck - supple Cor- 2+ radial RRR Pulm - clear to A&P       Assessment & Plan:

## 2012-06-05 DIAGNOSIS — I10 Essential (primary) hypertension: Secondary | ICD-10-CM | POA: Insufficient documentation

## 2012-06-05 NOTE — Assessment & Plan Note (Signed)
Anxiety and depression - many stressors Plan Recommend counseling, short term to deal with some of the stress: Judithe Modest, MSW. Call [phone] to schedule an appointment  Continue the Celxa  Will add BuSpar -comes as a 15 mg tab that is scored. Start with 5 mg three times a day x 1 week, then 7.5 mg three times a day for 2 week and if         Anxiety is not controlled you can advance to 15 mg three times a day.

## 2012-06-05 NOTE — Assessment & Plan Note (Signed)
Blood pressure - history of elevated BP now worse, multifactorial.  Plan Continue cardiazem 240 mg  Start losartan/hct 50/12.5 and stop the plain Hctz  F/u office visit and lab in 1 month

## 2012-07-03 ENCOUNTER — Ambulatory Visit: Payer: Commercial Indemnity | Admitting: Internal Medicine

## 2012-07-10 ENCOUNTER — Ambulatory Visit (INDEPENDENT_AMBULATORY_CARE_PROVIDER_SITE_OTHER): Payer: Commercial Indemnity | Admitting: Internal Medicine

## 2012-07-10 ENCOUNTER — Encounter: Payer: Self-pay | Admitting: Internal Medicine

## 2012-07-10 VITALS — BP 126/90 | HR 68 | Temp 97.9°F | Resp 12 | Wt 232.8 lb

## 2012-07-10 DIAGNOSIS — M069 Rheumatoid arthritis, unspecified: Secondary | ICD-10-CM

## 2012-07-10 MED ORDER — TRAMADOL HCL 50 MG PO TABS: 100.0000 mg | ORAL_TABLET | Freq: Three times a day (TID) | ORAL | Status: DC | PRN

## 2012-07-10 NOTE — Progress Notes (Signed)
  Subjective:    Patient ID: Jennifer Kelly, female    DOB: [birth year], 52 y.o.   MRN: [id number]  HPI Presents for follow up after starting BuSpar - she has seen a real improvement but has not been taking it on a regular basis.  BP is great  She has pulled  Her back pushing heavy weight across carpet.   She inquires about the use of tramadol   Review of Systems     Objective:   Physical Exam        Assessment & Plan:

## 2012-07-10 NOTE — Patient Instructions (Addendum)
1. Anxiety - I am glad you are doing better. Please take the BuSpar three times a day as instructed  2. Blood pressure is looking great.  3. Back pain - I believe you have strained your back with heavy Pulling. Plan Heat, stretching  4. Pain management - ok to try tramadol: Plan tramadol 100 mg (2 x 50mg ) three times a day.  Percocet every 6 hours as needed - the main pain controller should be the tramadol.  Keep me posted as to how this is working.

## 2012-07-11 NOTE — Assessment & Plan Note (Signed)
Pain management: Trial of using tramadol as primary pain medication at 100 mg tid, using percocet for breakthrough

## 2012-07-11 NOTE — Assessment & Plan Note (Signed)
BP Readings from Last 3 Encounters:  07/10/12 126/90  06/03/12 162/88  01/31/12 132/92   Good control at today's visit.  Plan  Continue present medication.

## 2012-07-11 NOTE — Assessment & Plan Note (Signed)
Improved mood and decreased anxiety. Stressors are reduced  Plan Continue celexa  Take BuSpar on schedule TID - starting at 5 mg.

## 2012-07-31 ENCOUNTER — Other Ambulatory Visit: Payer: Self-pay | Admitting: Internal Medicine

## 2012-08-02 NOTE — Telephone Encounter (Signed)
Temazepam called in to [facility] [id number]

## 2012-08-16 ENCOUNTER — Other Ambulatory Visit: Payer: Self-pay | Admitting: Internal Medicine

## 2012-09-30 ENCOUNTER — Other Ambulatory Visit: Payer: Self-pay | Admitting: Internal Medicine

## 2012-10-01 NOTE — Telephone Encounter (Signed)
Temazepam called to pharmacy #30 plus 1 refill

## 2012-10-05 ENCOUNTER — Encounter (HOSPITAL_COMMUNITY): Payer: Self-pay | Admitting: *Deleted

## 2012-10-05 ENCOUNTER — Emergency Department (HOSPITAL_COMMUNITY)
Admission: EM | Admit: 2012-10-05 | Discharge: 2012-10-05 | Disposition: A | Discharge status: Home / Self Care | Payer: Commercial Indemnity | Attending: Emergency Medicine | Admitting: Emergency Medicine

## 2012-10-05 DIAGNOSIS — B029 Zoster without complications: Secondary | ICD-10-CM | POA: Insufficient documentation

## 2012-10-05 DIAGNOSIS — R21 Rash and other nonspecific skin eruption: Secondary | ICD-10-CM | POA: Insufficient documentation

## 2012-10-05 DIAGNOSIS — I10 Essential (primary) hypertension: Secondary | ICD-10-CM | POA: Insufficient documentation

## 2012-10-05 DIAGNOSIS — F17200 Nicotine dependence, unspecified, uncomplicated: Secondary | ICD-10-CM | POA: Insufficient documentation

## 2012-10-05 DIAGNOSIS — Z79899 Other long term (current) drug therapy: Secondary | ICD-10-CM | POA: Insufficient documentation

## 2012-10-05 DIAGNOSIS — Z8739 Personal history of other diseases of the musculoskeletal system and connective tissue: Secondary | ICD-10-CM | POA: Insufficient documentation

## 2012-10-05 HISTORY — DX: Unspecified osteoarthritis, unspecified site: M19.90

## 2012-10-05 HISTORY — DX: Essential (primary) hypertension: I10

## 2012-10-05 MED ORDER — HYDROMORPHONE HCL 2 MG PO TABS: 2.0000 mg | ORAL_TABLET | ORAL | Status: DC | PRN

## 2012-10-05 MED ORDER — HYDROMORPHONE HCL PF 2 MG/ML IJ SOLN
2.0000 mg | Freq: Once | INTRAMUSCULAR | Status: AC
  Administered 2012-10-05: 2 mg via INTRAMUSCULAR
  Filled 2012-10-05: qty 1

## 2012-10-05 NOTE — ED Notes (Signed)
Reports having pain to right side back/side and breast x 1 week, went to [facility] today and dx with shingles. Was started on valtrex today. Sent her due to pain control. No relief with percocet.

## 2012-10-05 NOTE — ED Provider Notes (Signed)
History    This chart was scribed for non-physician practitioner Junious Silk PA-C working with Glynn Octave, MD by Smitty Pluck, ED scribe. This patient was seen in room TR05C/TR05C and the patient's care was started at 8:10 PM.   CSN: [id number]  Arrival date & time 10/05/12  1830       Chief Complaint  Patient presents with  . Herpes Zoster    The history is provided by the patient. No language interpreter was used.   HPI Comments: Jennifer Kelly is a 52 y.o. female with hx of RA and HTN who presents to the Emergency Department complaining of constant, severe back and right flank pain at site of rash onset 1 week ago. It has been worsening in the past week which is what prompted her to seek care. Pt was seen at [facility] and was diagnosed with shingles today. She states she has taken percocet without relief. She reports that she has taken 1 dose of valtrex today. Pt denies fever, chills, nausea, vomiting, diarrhea, weakness, cough, SOB and any other pain.   Past Medical History  Diagnosis Date  . Arthritis   . Hypertension     Past Surgical History  Procedure Laterality Date  . Tooth extraction  05/2012    History reviewed. No pertinent family history.  History  Substance Use Topics  . Smoking status: Current Every Day Smoker  . Smokeless tobacco: Not on file  . Alcohol Use: Yes     Comment: occ wine    OB History   Grav Para Term Preterm Abortions TAB SAB Ect Mult Living                  Review of Systems  Constitutional: Negative for fever and chills.  Respiratory: Negative for shortness of breath.   Gastrointestinal: Negative for nausea and vomiting.  Skin: Positive for rash.  Neurological: Negative for weakness.  All other systems reviewed and are negative.    Allergies  Review of patient's allergies indicates no known allergies.  Home Medications   Current Outpatient Rx  Name  Route  Sig  Dispense  Refill  . buprenorphine (BUTRANS) 20 MCG/HR  PTWK   Transdermal   Place 20 mcg onto the skin once a week.         . busPIRone (BUSPAR) 15 MG tablet   Oral   Take 1 tablet (15 mg total) by mouth 3 (three) times daily.   90 tablet   5   . citalopram (CELEXA) 20 MG tablet   Oral   Take 1 tablet (20 mg total) by mouth daily.   30 tablet   11     PLEASE KEEP office visit for further refills   . diltiazem (CARDIZEM CD) 240 MG 24 hr capsule   Oral   Take 1 capsule (240 mg total) by mouth daily.   30 capsule   11     PATIENT NEEDS TO KEEP OV  VISIT WITH DR. Debby Bud FO ...   . losartan-hydrochlorothiazide (HYZAAR) 50-12.5 MG per tablet   Oral   Take 1 tablet by mouth daily.   30 tablet   11   . oxyCODONE-acetaminophen (PERCOCET) 10-325 MG per tablet      Take  1 to 2 tablets q 4-6 hours daily         . temazepam (RESTORIL) 15 MG capsule      take 1 capsule by mouth at bedtime if needed for sleep   30 capsule  1   . Tofacitinib Citrate (XELJANZ) 5 MG TABS   Oral   Take 2 tablets by mouth daily.         . traMADol (ULTRAM) 50 MG tablet   Oral   Take 2 tablets (100 mg total) by mouth every 8 (eight) hours as needed for pain.   180 tablet   3   . VENTOLIN HFA 108 (90 BASE) MCG/ACT inhaler      inhale 2 puffs every 6 hours if needed   18 g   1     Dispense as written.     BP 153/106  Pulse 70  Temp(Src) 97.6 F (36.4 C) (Oral)  Resp 20  SpO2 98%  Physical Exam  Nursing note and vitals reviewed. Constitutional: She is oriented to person, place, and time. She appears well-developed and well-nourished. No distress.  HENT:  Head: Normocephalic and atraumatic.  Right Ear: External ear normal.  Left Ear: External ear normal.  Nose: Nose normal.  Mouth/Throat: Oropharynx is clear and moist.  Eyes: Conjunctivae are normal.  Neck: Normal range of motion.  Cardiovascular: Normal rate, regular rhythm and normal heart sounds.   Pulmonary/Chest: Effort normal and breath sounds normal. No stridor. No  respiratory distress. She has no wheezes. She has no rales.  Abdominal: Soft. She exhibits no distension.  Musculoskeletal: Normal range of motion.  Neurological: She is alert and oriented to person, place, and time. She has normal strength.  Skin: Skin is warm and dry. Rash noted. She is not diaphoretic.  Vesicular rash with surrounding erythema on right upper back  1 cm area of blanching erythema under right breast   Psychiatric: She has a normal mood and affect. Her behavior is normal.    ED Course  Procedures (including critical care time) DIAGNOSTIC STUDIES: Oxygen Saturation is 98% on room air, normal by my interpretation.    COORDINATION OF CARE: 8:13 PM Discussed ED treatment with pt and pt agrees.  Medications  HYDROmorphone (DILAUDID) injection 2 mg (not administered)     Labs Reviewed - No data to display No results found.   1. Shingles       MDM  Patient presents with diagnosed shingles. She states she states she has chronic pain with hx of RA and the percocet is not helping the pain caused by her shingles. She was given IM dilaudid in the ED and sent home with a short course of oral dilaudid. She will see her PCP on Monday for another pain management method. She has stopped taking her immunosuppressant for her RA and is taking Valtrex. No ocular involvement of the shingles. Return instructions given. Vital signs stable for discharge. Patient / Family / Caregiver informed of clinical course, understand medical decision-making process, and agree with plan.     I personally performed the services described in this documentation, which was scribed in my presence. The recorded information has been reviewed and is accurate.     Mora Bellman, PA-C 10/05/12 2200

## 2012-10-06 ENCOUNTER — Telehealth (HOSPITAL_COMMUNITY): Payer: Self-pay | Admitting: Emergency Medicine

## 2012-10-06 NOTE — ED Provider Notes (Signed)
Medical screening examination/treatment/procedure(s) were performed by non-physician practitioner and as supervising physician I was immediately available for consultation/collaboration.   Glynn Octave, MD 10/06/12 [phone]

## 2012-10-06 NOTE — ED Notes (Signed)
Pharmacy calling to make sure provider Randa Spike PA who wrote for diaudid Rx was aware pt had Percocet Rx currently.  Reviewed Randa Spike PA's note and she was aware pt had Percocet Rx. Pharmacy informed.

## 2012-10-07 ENCOUNTER — Encounter: Payer: Self-pay | Admitting: Family Medicine

## 2012-10-07 ENCOUNTER — Ambulatory Visit (INDEPENDENT_AMBULATORY_CARE_PROVIDER_SITE_OTHER): Payer: Commercial Indemnity | Admitting: Family Medicine

## 2012-10-07 VITALS — BP 110/84 | Temp 97.9°F | Wt 234.0 lb

## 2012-10-07 DIAGNOSIS — B029 Zoster without complications: Secondary | ICD-10-CM

## 2012-10-07 MED ORDER — HYDROCODONE-ACETAMINOPHEN 7.5-325 MG PO TABS: 1.0000 | ORAL_TABLET | Freq: Four times a day (QID) | ORAL | Status: DC | PRN

## 2012-10-07 NOTE — Progress Notes (Signed)
Chief Complaint  Patient presents with  . Herpes Zoster    pain     HPI:  Acute visit for ? Shingles: Rash started about 3 days ago Just seen in ED a few days ago On Valtrex 1000mg  tid started and ED gave her dilaudid PO for pain - these help but only has 7 left and is going to run out of them Also has RA and on immunosuppressive therapy- but she has contacted rheum office and stopped this temporarily Pain is her issue today and is running out of dilaudid and wants refill or another strong pain med as he percocet which she takes chronically is not working for back pain Denies: fevers, spreading of rash to face, HA, malaise  On percocet in the past and then added ultram per PCP notes, also using butrans patch and Restoril a few times per week  ROS: See pertinent positives and negatives per HPI.  Past Medical History  Diagnosis Date  . Arthritis   . Hypertension     No family history on file.  History   Social History  . Marital Status: Married    Spouse Name: N/A    Number of Children: N/A  . Years of Education: N/A   Social History Main Topics  . Smoking status: Current Every Day Smoker  . Smokeless tobacco: None  . Alcohol Use: Yes     Comment: occ wine  . Drug Use: No  . Sexually Active: None   Other Topics Concern  . None   Social History Narrative  . None    Current outpatient prescriptions:albuterol (PROVENTIL HFA;VENTOLIN HFA) 108 (90 BASE) MCG/ACT inhaler, Inhale 2 puffs into the lungs every 6 (six) hours as needed for wheezing., Disp: , Rfl: ;  buprenorphine (BUTRANS) 20 MCG/HR PTWK, Place 20 mcg onto the skin once a week., Disp: , Rfl: ;  citalopram (CELEXA) 20 MG tablet, Take 20 mg by mouth daily., Disp: , Rfl:  diltiazem (CARDIZEM CD) 240 MG 24 hr capsule, Take 240 mg by mouth daily., Disp: , Rfl: ;  HYDROmorphone (DILAUDID) 2 MG tablet, Take 1 tablet (2 mg total) by mouth every 4 (four) hours as needed for pain., Disp: 15 tablet, Rfl: 0;  losartan  (COZAAR) 50 MG tablet, Take 50 mg by mouth daily., Disp: , Rfl: ;  oxyCODONE-acetaminophen (PERCOCET) 10-325 MG per tablet, Take 1 tablet by mouth every 4 (four) hours as needed for pain. , Disp: , Rfl:  temazepam (RESTORIL) 15 MG capsule, Take 15 mg by mouth at bedtime as needed for sleep., Disp: , Rfl: ;  Tofacitinib Citrate (XELJANZ) 5 MG TABS, Take 5 mg by mouth 2 (two) times daily. , Disp: , Rfl: ;  traMADol (ULTRAM) 50 MG tablet, Take 50 mg by mouth every 6 (six) hours as needed for pain., Disp: , Rfl: ;  valACYclovir (VALTREX) 1000 MG tablet, Take 1,000 mg by mouth 3 (three) times daily., Disp: , Rfl:  HYDROcodone-acetaminophen (NORCO) 7.5-325 MG per tablet, Take 1 tablet by mouth every 6 (six) hours as needed for pain (for severe pain, do not take on the same day as dilaudid, percocet or restoril or alcohol)., Disp: 15 tablet, Rfl: 0  EXAM:  Filed Vitals:   10/07/12 1541  BP: 110/84  Temp: 97.9 F (36.6 C)    Body mass index is 34.54 kg/(m^2).  GENERAL: vitals reviewed and listed above, alert, oriented, appears well hydrated and in no acute distress  HEENT: atraumatic, conjunttiva clear, no obvious abnormalities on  inspection of external nose and ears  NECK: no obvious masses on inspection  LUNGS: clear to auscultation bilaterally, no wheezes, rales or rhonchi, good air movement  CV: HRRR, no peripheral edema  SKIN: few papular vesicular lesions back and side of trunk  MS: moves all extremities without noticeable abnormality  PSYCH: pleasant and cooperative, no obvious depression or anxiety  ASSESSMENT AND PLAN:  Discussed the following assessment and plan:  Shingles - Plan: HYDROcodone-acetaminophen (NORCO) 7.5-325 MG per tablet  -refill pain medication short term for when she runs out of dilaudid until sees PCP offic/warned of risks with interactions and advised no to use any of her medications together - she will follow up with PCP office later this week -defer to PCP  and rheum for management meds otherwise - she has been on butrans and opiods pain meds for some time. Advised her to let doctor prescibing butrans know she is going to use norco short term in place of the percocet -Patient advised to return or notify a doctor immediately if symptoms worsen or persist or new concerns arise.  Patient Instructions  -Call your doctor whom is prescribing the butrans patach and make sure ok to takethe norco also for the shingles pain  -DO NOT stack or take different pain medications together - do not take the norco, dilaudid, percocet and or ultram in the same day (choose one and stick to recommended dosing and don't take more then prescribed)  -follow up with your clinic in the next 3-7 days and for all further pain issues     KIM, HANNAH R.

## 2012-10-07 NOTE — Patient Instructions (Addendum)
-  Call your doctor whom is prescribing the butrans patch and make sure ok to take the norco also for the shingles pain  -DO NOT stack or take different pain medications together - do not take the norco, dilaudid, percocet and or ultram in the same day (choose one and stick to recommended dosing and don't take more then prescribed)  -follow up with your clinic in the next 3-7 days and for all further pain issues

## 2012-10-29 ENCOUNTER — Encounter: Payer: Self-pay | Admitting: Internal Medicine

## 2012-10-29 ENCOUNTER — Ambulatory Visit (INDEPENDENT_AMBULATORY_CARE_PROVIDER_SITE_OTHER): Payer: Commercial Indemnity | Admitting: Internal Medicine

## 2012-10-29 VITALS — BP 150/90 | HR 88 | Temp 97.9°F | Resp 16 | Ht 72.0 in | Wt 237.0 lb

## 2012-10-29 DIAGNOSIS — B0229 Other postherpetic nervous system involvement: Secondary | ICD-10-CM

## 2012-10-29 MED ORDER — GABAPENTIN 600 MG PO TABS: 600.0000 mg | ORAL_TABLET | Freq: Three times a day (TID) | ORAL | Status: DC

## 2012-10-29 NOTE — Progress Notes (Signed)
  Subjective:    Patient ID: Jennifer Kelly, female    DOB: 1961/02/06, 52 y.o.   MRN: [id number]  HPI Approximately 2 weeks ago Jennifer Kelly developed marked burning pain at the right upper scapula. This was progressive until she had pain moving from the scapula around the side to the anterior chest. She went to the Urgent care and was diagnosed with Shingle - which by this time had declared itself as a typical rash. She was started on Valtrex 1g tid x 7. Due to severe pain she went to the ED and was given limited amount of dilaudid. She also chronically uses oxycodone/APAP 10/325 for back pain but this did not relieve her pain. She saw Dr. Selena Kelly at [facility] 5/19 at which time hydrocodone was added. She continues to have severe pain. Of note she stopped her rheumatologic biologic meds at the onset of this problem.  PMH, FamHx and SocHx reviewed for any changes and relevance. Current Outpatient Prescriptions on File Prior to Visit  Medication Sig Dispense Refill  . albuterol (PROVENTIL HFA;VENTOLIN HFA) 108 (90 BASE) MCG/ACT inhaler Inhale 2 puffs into the lungs every 6 (six) hours as needed for wheezing.      . citalopram (CELEXA) 20 MG tablet Take 20 mg by mouth daily.      Marland Kitchen diltiazem (CARDIZEM CD) 240 MG 24 hr capsule Take 240 mg by mouth daily.      Marland Kitchen losartan (COZAAR) 50 MG tablet Take 50 mg by mouth daily.      Marland Kitchen oxyCODONE-acetaminophen (PERCOCET) 10-325 MG per tablet Take 1 tablet by mouth every 4 (four) hours as needed for pain.       Marland Kitchen temazepam (RESTORIL) 15 MG capsule Take 15 mg by mouth at bedtime as needed for sleep.      . Tofacitinib Citrate (XELJANZ) 5 MG TABS Take 5 mg by mouth 2 (two) times daily.        No current facility-administered medications on file prior to visit.      Review of Systems System review is negative for any constitutional, cardiac, pulmonary, GI or neuro symptoms or complaints other than as described in the HPI.     Objective:   Physical Exam Filed  Vitals:   10/29/12 1626  BP: 150/90  Pulse: 88  Temp: 97.9 F (36.6 C)  Resp: 16   Wt Readings from Last 3 Encounters:  10/29/12 237 lb (107.502 kg)  10/07/12 234 lb (106.142 kg)  07/10/12 232 lb 12.8 oz (105.597 kg)   gen'l - overweight white woman in pain but no acute distress HEENT- PERRLA Cor- RRR Pulm - normal respirations Derm - minimal residual erythema and healed vesicles right back and under right breast       Assessment & Plan:

## 2012-10-29 NOTE — Assessment & Plan Note (Signed)
Zoster May '14 with resolution of rash by June 10th but persistent severe pain.   Plan Gabapentin - titrating up to 600 mg tid and if needed to 600 mg qid  May continue oxycodone for back pain.  For unrelieved PHN pain will consider, in addition to gabapentin, long acting narcotic. She tried butran patch for pain - no success.

## 2012-10-29 NOTE — Patient Instructions (Addendum)
     Postherpetic Neuralgia Shingles is a painful disease. It is caused by the herpes zoster virus. This is the same virus which also causes chickenpox. It can affect the torso, limbs, or the face. For most people, shingles is a condition of rather sudden onset. Pain usually lasts about 1 month. In older patients, or patients with poor immune systems, a painful, long-standing (chronic) condition called postherpetic neuralgia can develop. This condition rarely happens before age 26. But at least 50% of people over 50 become affected following an attack of shingles. There is a natural tendency for this condition to improve over time with no treatment. Less than 5% of patients have pain that lasts for more than 1 year. DIAGNOSIS  Herpes is usually easily diagnosed on physical exam. Pain sometimes follows when the skin sores (lesions) have disappeared. It is called postherpetic neuralgia. That name simply means the pain that follows herpes. TREATMENT   Treating this condition may be difficult. Usually one of the tricyclic antidepressants, often amitriptyline, is the first line of treatment. There is evidence that the sooner these medications are given, the more likely they are to reduce pain.  Conventional analgesics, regional nerve blocks, and anticonvulsants have little benefit in most cases when used alone. Other tricyclic anti-depressants are used as a second option if the first antidepressant is unsuccessful.  Anticonvulsants, including carbamazepine, have been found to provide some added benefit when used with a tricyclic anti-depressant. This is especially for the stabbing type of pain similar to that of trigeminal neuralgia.  Chronic opioid therapy. This is a strong narcotic pain medication. It is used to treat pain that is resistant to other measures. The issues of dependency and tolerance can be reduced with closely managed care.  Some cream treatments are applied locally to the affected  area. They can help when used with other treatments. Their use may be difficult in the case of postherpetic trigeminal neuralgia. This is involved with the face. So the substances can irritate the eye and the skin around the eye. Examples of creams used include Capsaicin and lidocaine creams.  For shingles, antiviral therapies along with analgesics are recommended. Studies of the effect of anti-viral agents such as acyclovir on shingles have been done. They show improved rates of healing and decreased severity of sudden (acute) pain. Some observations suggest that nerve blocks during shingles infection will:  Reduce pain.  Shorten the acute episode.  Prevent the emergence of postherpetic neuralgia. Viral medications used include Acyclovir (Zovirax), Valacyclovir, Famciclovir and a lysine diet. Document Released: 07/29/2002 Document Revised: 07/31/2011 Document Reviewed: 05/08/2005 [facility] Patient Information 2014 [place], [place].  Treatment: Gabapentin 600 mg three times a day. If this not enough for relief increase to 4 times a day.   Keep me posted.

## 2012-10-30 ENCOUNTER — Encounter: Payer: Self-pay | Admitting: Internal Medicine

## 2012-10-31 ENCOUNTER — Encounter: Payer: Self-pay | Admitting: Internal Medicine

## 2012-10-31 MED ORDER — FENTANYL 50 MCG/HR TD PT72: 1.0000 | MEDICATED_PATCH | TRANSDERMAL | Status: DC

## 2012-11-04 ENCOUNTER — Encounter: Payer: Self-pay | Admitting: Internal Medicine

## 2012-11-18 ENCOUNTER — Other Ambulatory Visit: Payer: Self-pay

## 2012-11-18 ENCOUNTER — Encounter: Payer: Self-pay | Admitting: Internal Medicine

## 2012-11-18 MED ORDER — ALBUTEROL SULFATE HFA 108 (90 BASE) MCG/ACT IN AERS: 2.0000 | INHALATION_SPRAY | Freq: Four times a day (QID) | RESPIRATORY_TRACT | Status: DC | PRN

## 2012-11-21 ENCOUNTER — Telehealth: Payer: Self-pay | Admitting: *Deleted

## 2012-11-21 MED ORDER — FENTANYL 50 MCG/HR TD PT72: 1.0000 | MEDICATED_PATCH | TRANSDERMAL | Status: DC

## 2012-11-21 NOTE — Telephone Encounter (Signed)
Scripts printed and waiting to be signed. 

## 2012-11-21 NOTE — Telephone Encounter (Signed)
Patient notified via voicemail that she can pick up her scripts.

## 2012-11-21 NOTE — Telephone Encounter (Signed)
Ok to prepare refill script for #10, 2 prescriptions

## 2012-11-21 NOTE — Telephone Encounter (Signed)
Pt called requesting a refill on her Fentanyl Patch.  She states she will be out as of Sunday 7.6.2014.

## 2013-01-02 ENCOUNTER — Ambulatory Visit (INDEPENDENT_AMBULATORY_CARE_PROVIDER_SITE_OTHER): Payer: 59 | Admitting: Internal Medicine

## 2013-01-02 ENCOUNTER — Encounter: Payer: Self-pay | Admitting: Internal Medicine

## 2013-01-02 VITALS — BP 130/90 | HR 64 | Temp 97.5°F | Wt 244.4 lb

## 2013-01-02 DIAGNOSIS — E669 Obesity, unspecified: Secondary | ICD-10-CM

## 2013-01-02 DIAGNOSIS — B0229 Other postherpetic nervous system involvement: Secondary | ICD-10-CM

## 2013-01-02 DIAGNOSIS — M545 Low back pain, unspecified: Secondary | ICD-10-CM

## 2013-01-02 DIAGNOSIS — F418 Other specified anxiety disorders: Secondary | ICD-10-CM

## 2013-01-02 DIAGNOSIS — F341 Dysthymic disorder: Secondary | ICD-10-CM

## 2013-01-02 MED ORDER — GABAPENTIN 600 MG PO TABS: 600.0000 mg | ORAL_TABLET | Freq: Three times a day (TID) | ORAL | Status: DC

## 2013-01-02 MED ORDER — CITALOPRAM HYDROBROMIDE 20 MG PO TABS: 20.0000 mg | ORAL_TABLET | Freq: Every day | ORAL | Status: DC

## 2013-01-02 MED ORDER — FENTANYL 50 MCG/HR TD PT72: 1.0000 | MEDICATED_PATCH | TRANSDERMAL | Status: DC

## 2013-01-02 MED ORDER — OXYCODONE-ACETAMINOPHEN 10-325 MG PO TABS: 1.0000 | ORAL_TABLET | Freq: Every day | ORAL | Status: DC | PRN

## 2013-01-02 MED ORDER — OXYCODONE-ACETAMINOPHEN 10-325 MG PO TABS: 1.0000 | ORAL_TABLET | ORAL | Status: DC | PRN

## 2013-01-02 NOTE — Patient Instructions (Addendum)
So glad that you are doing well.   Plan is to provide the percocet Rx for Aug 24 and Sept 24 and will try to keep all the Rx's in sync.

## 2013-01-02 NOTE — Progress Notes (Signed)
  Subjective:    Patient ID: Jennifer Kelly, female    DOB: [birth year], 51 y.o.   MRN: [id number]  HPI Jennifer Kelly for follow-up - she has had good pain control on her current regimen. She will be changing her percocet prescriptions here.  No constipation problems. No somnolence, no mental changes.  PMH, FamHx and SocHx reviewed for any changes and relevance. Current Outpatient Prescriptions on File Prior to Visit  Medication Sig Dispense Refill  . albuterol (PROVENTIL HFA;VENTOLIN HFA) 108 (90 BASE) MCG/ACT inhaler Inhale 2 puffs into the lungs every 6 (six) hours as needed for wheezing.  18 g  11  . diltiazem (CARDIZEM CD) 240 MG 24 hr capsule Take 240 mg by mouth daily.      Marland Kitchen losartan (COZAAR) 50 MG tablet Take 50 mg by mouth daily.      . temazepam (RESTORIL) 15 MG capsule Take 15 mg by mouth at bedtime as needed for sleep.      . Tofacitinib Citrate (XELJANZ) 5 MG TABS Take 5 mg by mouth 2 (two) times daily.        No current facility-administered medications on file prior to visit.        Review of Systems System review is negative for any constitutional, cardiac, pulmonary, GI or neuro symptoms or complaints other than as described in the HPI.     Objective:   Physical Exam Filed Vitals:   01/02/13 1401  BP: 130/90  Pulse: 64  Temp: 97.5 F (36.4 C)   Wt Readings from Last 3 Encounters:  01/02/13 244 lb 6.4 oz (110.859 kg)  10/29/12 237 lb (107.502 kg)  10/07/12 234 lb (106.142 kg)   BP Readings from Last 3 Encounters:  01/02/13 130/90  10/29/12 150/90  10/07/12 110/84   Gen'l large framed white woman in no distress HEENT - C&SW clear, PERRLA Cor- RRR Pulm - normal respirations Neuro - A&O x 3.        Assessment & Plan:

## 2013-01-06 NOTE — Assessment & Plan Note (Signed)
Pain is doing much better - continues to use duragesic patch and norco.

## 2013-01-06 NOTE — Assessment & Plan Note (Signed)
Stable on her present medications

## 2013-01-06 NOTE — Assessment & Plan Note (Signed)
10 lb weight gain since last visit.  Plan - Diet management: smart food choices, PORTION SIZE CONTROL, regular exercise. Goal - to loose 1-2 lbs.month. Target weight - 190 lbs

## 2013-01-06 NOTE — Assessment & Plan Note (Signed)
Chronic low back pain - was seen at pain clinic but no longer. Is asking the [facility] be primary prescriber.  Plan - Rx for Norco provided.

## 2013-01-24 ENCOUNTER — Encounter: Payer: Self-pay | Admitting: Internal Medicine

## 2013-02-04 ENCOUNTER — Other Ambulatory Visit: Payer: Self-pay | Admitting: Internal Medicine

## 2013-02-17 ENCOUNTER — Encounter: Payer: Self-pay | Admitting: Internal Medicine

## 2013-02-19 ENCOUNTER — Other Ambulatory Visit: Payer: Self-pay | Admitting: Internal Medicine

## 2013-02-25 ENCOUNTER — Ambulatory Visit: Payer: 59 | Admitting: Internal Medicine

## 2013-02-27 ENCOUNTER — Ambulatory Visit (INDEPENDENT_AMBULATORY_CARE_PROVIDER_SITE_OTHER): Payer: 59 | Admitting: Internal Medicine

## 2013-02-27 ENCOUNTER — Encounter: Payer: Self-pay | Admitting: Internal Medicine

## 2013-02-27 VITALS — BP 160/100 | HR 70 | Temp 96.8°F | Wt 242.8 lb

## 2013-02-27 DIAGNOSIS — I10 Essential (primary) hypertension: Secondary | ICD-10-CM

## 2013-02-27 DIAGNOSIS — M069 Rheumatoid arthritis, unspecified: Secondary | ICD-10-CM

## 2013-02-27 DIAGNOSIS — B0229 Other postherpetic nervous system involvement: Secondary | ICD-10-CM

## 2013-02-27 MED ORDER — FENTANYL 50 MCG/HR TD PT72: 1.0000 | MEDICATED_PATCH | TRANSDERMAL | Status: DC

## 2013-02-27 MED ORDER — OXYCODONE-ACETAMINOPHEN 10-325 MG PO TABS: 1.0000 | ORAL_TABLET | Freq: Every day | ORAL | Status: DC | PRN

## 2013-02-27 MED ORDER — OXYCODONE-ACETAMINOPHEN 10-325 MG PO TABS
1.0000 | ORAL_TABLET | Freq: Every day | ORAL | Status: DC | PRN
Start: 2013-02-27 — End: 2013-02-27

## 2013-02-27 NOTE — Progress Notes (Deleted)
Subjective:     Patient ID: Jennifer Kelly, female   DOB: [birth year], 52 y.o.   MRN: [id number]  HPI Ms. Loeza is a 52 year old woman with a history of depression, PTSD, post-herpetic neuralgia, HTN, Meniere's dz, obesity, tobacco abuse, RA, and chronic lower back pain who presents today for ***  Review of Systems     Objective:   Physical Exam     Assessment:     ***    Plan:     ***

## 2013-02-27 NOTE — Progress Notes (Signed)
  Subjective:    Patient ID: Jennifer Kelly, female    DOB: Oct 16, 1960, 52 y.o.   MRN: [id number]  HPI Had a flare in Sept of RA/OA required steroids. She did ok. We discussed the management of periodic flares: better to continue base line fentanyl and rely on the percocet for breakthrough.  No GI or cognitive problems on meds.  PMH, FamHx and SocHx reviewed for any changes and relevance.   Review of Systems PMH, FamHx and SocHx reviewed for any changes and relevance.     Objective:   Physical Exam Filed Vitals:   02/27/13 1535  BP: 160/100  Pulse: 70  Temp: 96.8 F (36 C)   Gen'l- WNWD woman, overweight Cor RRR Pulm - normal Neuro - non-focal         Assessment & Plan:

## 2013-03-02 NOTE — Assessment & Plan Note (Signed)
BP Readings from Last 3 Encounters:  02/27/13 160/100  01/02/13 130/90  10/29/12 150/90   Pretty good control but elevated at today's visit.  No change in medication.

## 2013-03-02 NOTE — Assessment & Plan Note (Signed)
Continues to have pain but gets control with fentanyl patch.  Rx- refilled.

## 2013-03-02 NOTE — Assessment & Plan Note (Signed)
Had recent flare of OA on RA which has subsided. She did increase her use of oxycodone during this flare. Her med count had been reduced but she requests resuming prior count due to possibility of recurrent OA/RA flare.  OK to resume 180 pill count. New Rx's provided.

## 2013-03-26 ENCOUNTER — Encounter: Payer: Self-pay | Admitting: Internal Medicine

## 2013-03-27 ENCOUNTER — Other Ambulatory Visit: Payer: Self-pay

## 2013-04-14 ENCOUNTER — Telehealth: Payer: Self-pay

## 2013-04-14 MED ORDER — OXYCODONE-ACETAMINOPHEN 10-325 MG PO TABS: ORAL_TABLET | ORAL | Status: DC

## 2013-04-14 NOTE — Telephone Encounter (Signed)
Yep - i did ok her increase to 6 tabs a day. May have # 30 as a separate script and change her usual refills to 180 per month

## 2013-04-14 NOTE — Telephone Encounter (Signed)
Phone call from patient on Friday at 2:49 pm says her prescriptions for Oxycodone are wrong. She says you increased her from taking 1 tablet by mouth five times daily as needed for pain to 1 tablet by mouth six times as needed for pain. She states the pharmacy called and got it straightened out for Oct. This month she was shorted 30 pills since the pharmacy went by the directions of taking it five times a day. She would like a script for 30 pills and then to have a new script for Dec stating she takes it six times a day. Please advise.

## 2013-04-14 NOTE — Telephone Encounter (Signed)
I spoke to patient and let her know we will redo the script for Dec but she needs to bring in the Dec script she has and switch for the new one.   A script for thirty days is to be printed as well  Patient is aware

## 2013-05-06 ENCOUNTER — Telehealth: Payer: Self-pay

## 2013-05-06 MED ORDER — SULFAMETHOXAZOLE-TMP DS 800-160 MG PO TABS: 1.0000 | ORAL_TABLET | Freq: Two times a day (BID) | ORAL | Status: DC

## 2013-05-06 MED ORDER — PREDNISONE 10 MG PO TABS: 10.0000 mg | ORAL_TABLET | Freq: Every day | ORAL | Status: DC

## 2013-05-06 NOTE — Telephone Encounter (Signed)
Phone call from patient [phone] stating she knows she has bronchitis. She would like an antibiotic called in to [facility] in [place] on [address]. She's having trouble breathing, wheezing, coughing. She has been using her inhaler. Please advise.

## 2013-05-06 NOTE — Telephone Encounter (Signed)
Called patient: purulent sputum, wheezing but not short of breath. She has been using bronchodilator BID  Plan Septra DS bid x 7  Prednisone burst and taper  Supportive care - robutissin DM etc.  Carefully instructed that if she doesn't improve, if she needs to use inhaler more than 3 times a day she needs OV>

## 2013-05-26 ENCOUNTER — Ambulatory Visit (INDEPENDENT_AMBULATORY_CARE_PROVIDER_SITE_OTHER): Payer: 59 | Admitting: Internal Medicine

## 2013-05-26 ENCOUNTER — Encounter: Payer: Self-pay | Admitting: Internal Medicine

## 2013-05-26 VITALS — BP 142/96 | HR 69 | Temp 97.0°F | Wt 248.0 lb

## 2013-05-26 DIAGNOSIS — I10 Essential (primary) hypertension: Secondary | ICD-10-CM

## 2013-05-26 DIAGNOSIS — F17200 Nicotine dependence, unspecified, uncomplicated: Secondary | ICD-10-CM

## 2013-05-26 DIAGNOSIS — M069 Rheumatoid arthritis, unspecified: Secondary | ICD-10-CM

## 2013-05-26 DIAGNOSIS — F418 Other specified anxiety disorders: Secondary | ICD-10-CM

## 2013-05-26 DIAGNOSIS — G894 Chronic pain syndrome: Secondary | ICD-10-CM

## 2013-05-26 DIAGNOSIS — F341 Dysthymic disorder: Secondary | ICD-10-CM

## 2013-05-26 MED ORDER — OXYCODONE-ACETAMINOPHEN 10-325 MG PO TABS: ORAL_TABLET | ORAL | Status: DC

## 2013-05-26 MED ORDER — FENTANYL 50 MCG/HR TD PT72: 50.0000 ug | MEDICATED_PATCH | TRANSDERMAL | Status: DC

## 2013-05-26 NOTE — Patient Instructions (Signed)
Good to see you.  The hand rash is not psoriatic in appearance. Primary concern is from dryness and over use of hand sanitizer. Try to find a moisturizer.   Dr. Ouida Sills can be trusted re: any changes in your medications.   Please try to stop smoking: it is not how long you live but the quality of life and COPD has no quality.

## 2013-05-26 NOTE — Progress Notes (Signed)
Pre visit review using our clinic review tool, if applicable. No additional management support is needed unless otherwise documented below in the visit note. 

## 2013-05-26 NOTE — Progress Notes (Signed)
   Subjective:    Patient ID: Jennifer Kelly, female    DOB: [birth year], 53 y.o.   MRN: [id number]  HPI [name] presents for follow up. She is due for renewal of oxycodone and fentanyl prescriptions. She has had no adverse medication events related to narcotics. She has been reliable.  She has had a flare of wrist pain right and stiffness. She has also had a flare of skin outbreak - looks like eczema, but may psoriatic.    Review of Systems System review is negative for any constitutional, cardiac, pulmonary, GI or neuro symptoms or complaints other than as described in the HPI.     Objective:   Physical Exam Filed Vitals:   05/26/13 1315  BP: 142/96  Pulse: 69  Temp: 97 F (36.1 C)   Wt Readings from Last 3 Encounters:  05/26/13 248 lb (112.492 kg)  02/27/13 242 lb 12.8 oz (110.133 kg)  01/02/13 244 lb 6.4 oz (110.859 kg)   BP Readings from Last 3 Encounters:  05/26/13 142/96  02/27/13 160/100  01/02/13 130/90   Gen'l - Overweihgt woman in no distress HEENT- Clear Cor - 2+ RRR Pulm - clear MSK - right wrist without synovial thickening, erythema, effusion or heat. Full passive ROM. Hands - no abnormality MCP,PIP or DIP joints. Derm - left palm with desquamation lesions, right palm with single lesion. Neuro - A&O x 3, speech clear, non-focal exam.        Assessment & Plan:

## 2013-05-27 DIAGNOSIS — G894 Chronic pain syndrome: Secondary | ICD-10-CM | POA: Insufficient documentation

## 2013-05-27 NOTE — Assessment & Plan Note (Signed)
Stable on current regimen and has not called for early refills. Checked controlled substance data base - several episodes in the spring of '14 with Rx overlapping from Dr. Ouida Sills.  Plan Will remind patient of single prescriber rule.

## 2013-05-27 NOTE — Assessment & Plan Note (Signed)
A bit anxious in regard to recent flare of wrist pain and concern about palmar rash.  Plan Continue present medications

## 2013-05-27 NOTE — Assessment & Plan Note (Signed)
Still smoking. She is at contemplative stage.  Plan Encouraged smoking cessation.

## 2013-05-27 NOTE — Assessment & Plan Note (Signed)
BP Readings from Last 3 Encounters:  05/26/13 142/96  02/27/13 160/100  01/02/13 130/90   OK control on ARB and CCB

## 2013-05-27 NOTE — Assessment & Plan Note (Signed)
Concerned about recent flare involving right wrist. Also - concerned that palmar rash may be psoriatic. No nail pitting exam. No hand x-rays for evaluation. On exam rash does not look psoriatic.  Plan Follow up with Dr. Ouida Sills

## 2013-06-09 ENCOUNTER — Telehealth: Payer: Self-pay

## 2013-06-09 MED ORDER — LOSARTAN POTASSIUM-HCTZ 50-12.5 MG PO TABS: 1.0000 | ORAL_TABLET | Freq: Every day | ORAL | Status: DC

## 2013-06-09 NOTE — Telephone Encounter (Signed)
It appears that Benicar/hct was changed to Benicar by hospital pharmacist due to formulary. Reviewed BP readings - just a little high. OK to to resume losartan/hct 50/12.5, #30 refill 11. Please adjust med list

## 2013-06-09 NOTE — Telephone Encounter (Signed)
Received a fax from [facility] in [place] requesting a refill for Losartan-HCTZ 50/12.5 mg. I do not see this on her current med list. Please advise.

## 2013-06-09 NOTE — Telephone Encounter (Signed)
Prescription has been sent.

## 2013-06-20 ENCOUNTER — Telehealth: Payer: Self-pay | Admitting: Internal Medicine

## 2013-06-20 NOTE — Telephone Encounter (Signed)
Patient left a VM on the triage line wanting to know if she could have "several refills" on her diltiazem (CARDIZEM CD) 240 MG 24 hr capsule before Dr. Linda Hedges leaves. Please advise.

## 2013-06-23 MED ORDER — DILTIAZEM HCL ER COATED BEADS 240 MG PO CP24: 240.0000 mg | ORAL_CAPSULE | Freq: Every day | ORAL | Status: DC

## 2013-06-23 NOTE — Telephone Encounter (Signed)
Prescription has been sent.

## 2013-07-24 ENCOUNTER — Encounter: Payer: Self-pay | Admitting: Internal Medicine

## 2013-07-24 ENCOUNTER — Ambulatory Visit (INDEPENDENT_AMBULATORY_CARE_PROVIDER_SITE_OTHER): Payer: 59 | Admitting: Internal Medicine

## 2013-07-24 VITALS — BP 130/96 | HR 68 | Temp 97.6°F | Wt 256.8 lb

## 2013-07-24 DIAGNOSIS — F341 Dysthymic disorder: Secondary | ICD-10-CM

## 2013-07-24 DIAGNOSIS — G894 Chronic pain syndrome: Secondary | ICD-10-CM

## 2013-07-24 DIAGNOSIS — B0229 Other postherpetic nervous system involvement: Secondary | ICD-10-CM

## 2013-07-24 DIAGNOSIS — I10 Essential (primary) hypertension: Secondary | ICD-10-CM

## 2013-07-24 DIAGNOSIS — M069 Rheumatoid arthritis, unspecified: Secondary | ICD-10-CM

## 2013-07-24 DIAGNOSIS — F418 Other specified anxiety disorders: Secondary | ICD-10-CM

## 2013-07-24 DIAGNOSIS — E669 Obesity, unspecified: Secondary | ICD-10-CM

## 2013-07-24 MED ORDER — OXYCODONE-ACETAMINOPHEN 10-325 MG PO TABS: ORAL_TABLET | ORAL | Status: DC

## 2013-07-24 MED ORDER — FENTANYL 50 MCG/HR TD PT72: 50.0000 ug | MEDICATED_PATCH | TRANSDERMAL | Status: DC

## 2013-07-24 MED ORDER — GABAPENTIN 600 MG PO TABS: 600.0000 mg | ORAL_TABLET | Freq: Three times a day (TID) | ORAL | Status: DC

## 2013-07-24 NOTE — Progress Notes (Signed)
Pre visit review using our clinic review tool, if applicable. No additional management support is needed unless otherwise documented below in the visit note. 

## 2013-07-28 NOTE — Progress Notes (Signed)
   Subjective:    Patient ID: Jennifer Kelly, female    DOB: April 08, 1961, 53 y.o.   MRN: [id number]  HPI Jennifer Kelly presents for follow up of chronic pain. She has been adhering to her pain med regimen, has not had any constipation or somnolence and has been reliable and on schedule.  She has had an exacerbation of her RA and is currently having a flare not well controlled by prednisone. She will be seeing her rheumatologist Dr. Ouida Sills for change in treatment. She has been resistant to infusions of Remicade but is willing to explore this and all possibilities.   Her depression has been reasonably well controlled on Citalopram. Her mood is affected by her RA - frustrated and unhappy about the increase in pain.  Discussed transfer of care - she does prefer Dr. Cira Rue.  Past Medical History  Diagnosis Date  . Arthritis   . Hypertension    Past Surgical History  Procedure Laterality Date  . Tooth extraction  05/2012   History reviewed. No pertinent family history. History   Social History  . Marital Status: Married    Spouse Name: N/A    Number of Children: N/A  . Years of Education: N/A   Occupational History  . Not on file.   Social History Main Topics  . Smoking status: Current Every Day Smoker  . Smokeless tobacco: Not on file  . Alcohol Use: Yes     Comment: occ wine  . Drug Use: No  . Sexual Activity: Not on file   Other Topics Concern  . Not on file   Social History Narrative  . No narrative on file     Current Outpatient Prescriptions on File Prior to Visit  Medication Sig Dispense Refill  . albuterol (PROVENTIL HFA;VENTOLIN HFA) 108 (90 BASE) MCG/ACT inhaler Inhale 2 puffs into the lungs every 6 (six) hours as needed for wheezing.  18 g  11  . citalopram (CELEXA) 20 MG tablet take 1 tablet by mouth once daily  30 tablet  10  . diltiazem (CARDIZEM CD) 240 MG 24 hr capsule Take 1 capsule (240 mg total) by mouth daily.  30 capsule  11  .  losartan-hydrochlorothiazide (HYZAAR) 50-12.5 MG per tablet Take 1 tablet by mouth daily.  30 tablet  11  . Tofacitinib Citrate (XELJANZ) 5 MG TABS Take 5 mg by mouth 2 (two) times daily.        No current facility-administered medications on file prior to visit.      Review of Systems System review is negative for any constitutional, cardiac, pulmonary, GI or neuro symptoms or complaints other than as described in the HPI.     Objective:   Physical Exam Filed Vitals:   07/24/13 1451  BP: 130/96  Pulse: 68  Temp: 97.6 F (36.4 C)   Wt Readings from Last 3 Encounters:  07/24/13 256 lb 12.8 oz (116.484 kg)  05/26/13 248 lb (112.492 kg)  02/27/13 242 lb 12.8 oz (110.133 kg)   Body mass index is 34.82 kg/(m^2).  Gen'l - overweight woman in no acute distress HEENT_ C&S clear Cor- 2+ radial , RRR Pulm - clear to A&P Neuro - A&O x 3, CN II-XII grossly intact, normal Get up and go with a normal gait. Ext - swelling of the fingers but w/o erythema. Decreased grip.       Assessment & Plan:

## 2013-07-28 NOTE — Assessment & Plan Note (Signed)
Stable and adequately controlled.

## 2013-07-28 NOTE — Assessment & Plan Note (Signed)
BP Readings from Last 3 Encounters:  07/24/13 130/96  05/26/13 142/96  02/27/13 160/100   Adequate control at this time on her present regimen.

## 2013-07-28 NOTE — Assessment & Plan Note (Signed)
Stable on present medication.  Plan Refill meds  Sign up with [facility].

## 2013-07-28 NOTE — Assessment & Plan Note (Signed)
Patient is aware of the need to loose weight but has had weight gain attributed to steroids and inability to exercise due to RA  Plan She will work on calories control and exercise as her RA flare resolves

## 2013-07-28 NOTE — Assessment & Plan Note (Signed)
Flare of symptoms, not responding to steroids.  Plan She will be seeing Dr. Ouida Sills for follow up.

## 2013-07-28 NOTE — Assessment & Plan Note (Signed)
Continues to have PHN, but less severe.  Plan Continue pain med regimen

## 2013-08-15 ENCOUNTER — Encounter: Payer: Self-pay | Admitting: Internal Medicine

## 2013-09-08 ENCOUNTER — Telehealth: Payer: Self-pay | Admitting: Internal Medicine

## 2013-09-08 NOTE — Telephone Encounter (Signed)
Pt was a patient of Dr. Linda Hedges.  She has set up an appt with Dr. Charlett Blake to est care on Aug. 20 and is on a cancellation list for something sooner.  She was told to call us when she needs her next refill on pain medicine which would be before August.  She just want to be sure we will refill for her at that time.

## 2013-09-17 ENCOUNTER — Telehealth: Payer: Self-pay | Admitting: Family Medicine

## 2013-09-17 NOTE — Telephone Encounter (Signed)
Pt has an appt to est with Dr. Charlett Blake on Aug 20. She is a transfer from Dr. Linda Hedges.  She needs a refill on Gabapentin sent to [facility] in [place] on [address]  Per York Cerise, [facility] should be the one to refill. Please call when sent.

## 2013-09-18 MED ORDER — GABAPENTIN 600 MG PO TABS: 600.0000 mg | ORAL_TABLET | Freq: Three times a day (TID) | ORAL | Status: DC

## 2013-09-18 NOTE — Telephone Encounter (Signed)
I am OK to refill the Gabapentin for now but only enough til appt in August 30 day supply with 4 rf or 90 d with 1.

## 2013-09-18 NOTE — Telephone Encounter (Signed)
Please advise? I thought we don't refill until we see the patient?

## 2013-09-18 NOTE — Telephone Encounter (Signed)
Please inform pt that we sent in 30 days with 4 refills until she is seen in August  thanks

## 2013-09-18 NOTE — Telephone Encounter (Signed)
Informed patient of medication refill and she wanted dr. Charlett Blake to refill her narcotic prescriptions that would run out in July. I informed her that I didn't think that Dr. Charlett Blake would refill those w/o being seen so I scheduled patient to come in on 11/05/13. I was able to get her in earlier.

## 2013-09-22 ENCOUNTER — Telehealth: Payer: Self-pay

## 2013-09-22 NOTE — Telephone Encounter (Signed)
Patient left a message stating that she has a very important question and to return her call.  Pt states she had a controlled substance form with Dr Linda Hedges and she has to get her medication filled tomorrow and the [facility] told her that they have to order the medication. Pt just wanted to confirm that it was ok to go to another pharmacy? I informed pt that it was ok since she called and informed us and to continue with the regular pharmacy after this month.  Pt voiced understanding and stated she just wanted to make sure she is doing this correctly.

## 2013-11-05 ENCOUNTER — Encounter: Payer: Self-pay | Admitting: Family Medicine

## 2013-11-05 ENCOUNTER — Ambulatory Visit (INDEPENDENT_AMBULATORY_CARE_PROVIDER_SITE_OTHER): Payer: 59 | Admitting: Family Medicine

## 2013-11-05 VITALS — BP 132/92 | HR 86 | Temp 98.7°F | Ht 69.0 in | Wt 242.1 lb

## 2013-11-05 DIAGNOSIS — F418 Other specified anxiety disorders: Secondary | ICD-10-CM

## 2013-11-05 DIAGNOSIS — M26609 Unspecified temporomandibular joint disorder, unspecified side: Secondary | ICD-10-CM

## 2013-11-05 DIAGNOSIS — E669 Obesity, unspecified: Secondary | ICD-10-CM

## 2013-11-05 DIAGNOSIS — B0229 Other postherpetic nervous system involvement: Secondary | ICD-10-CM

## 2013-11-05 DIAGNOSIS — M069 Rheumatoid arthritis, unspecified: Secondary | ICD-10-CM

## 2013-11-05 DIAGNOSIS — F17200 Nicotine dependence, unspecified, uncomplicated: Secondary | ICD-10-CM

## 2013-11-05 DIAGNOSIS — M8560 Other cyst of bone, unspecified site: Secondary | ICD-10-CM

## 2013-11-05 DIAGNOSIS — F4310 Post-traumatic stress disorder, unspecified: Secondary | ICD-10-CM

## 2013-11-05 DIAGNOSIS — F341 Dysthymic disorder: Secondary | ICD-10-CM

## 2013-11-05 DIAGNOSIS — I10 Essential (primary) hypertension: Secondary | ICD-10-CM

## 2013-11-05 DIAGNOSIS — B029 Zoster without complications: Secondary | ICD-10-CM | POA: Insufficient documentation

## 2013-11-05 DIAGNOSIS — H8109 Meniere's disease, unspecified ear: Secondary | ICD-10-CM

## 2013-11-05 DIAGNOSIS — M545 Low back pain, unspecified: Secondary | ICD-10-CM

## 2013-11-05 DIAGNOSIS — Z8619 Personal history of other infectious and parasitic diseases: Secondary | ICD-10-CM | POA: Insufficient documentation

## 2013-11-05 MED ORDER — OXYCODONE-ACETAMINOPHEN 10-325 MG PO TABS: ORAL_TABLET | ORAL | Status: DC

## 2013-11-05 MED ORDER — FENTANYL 50 MCG/HR TD PT72: 50.0000 ug | MEDICATED_PATCH | TRANSDERMAL | Status: DC

## 2013-11-05 NOTE — Patient Instructions (Signed)
[facility] in [place] vs [place] for colonoscopy   Preventive Care for Adults A healthy lifestyle and preventive care can promote health and wellness. Preventive health guidelines for women include the following key practices.  A routine yearly physical is a good way to check with your health care provider about your health and preventive screening. It is a chance to share any concerns and updates on your health and to receive a thorough exam.  Visit your dentist for a routine exam and preventive care every 6 months. Brush your teeth twice a day and floss once a day. Good oral hygiene prevents tooth decay and gum disease.  The frequency of eye exams is based on your age, health, family medical history, use of contact lenses, and other factors. Follow your health care provider's recommendations for frequency of eye exams.  Eat a healthy diet. Foods like vegetables, fruits, whole grains, low-fat dairy products, and lean protein foods contain the nutrients you need without too many calories. Decrease your intake of foods high in solid fats, added sugars, and salt. Eat the right amount of calories for you.Get information about a proper diet from your health care provider, if necessary.  Regular physical exercise is one of the most important things you can do for your health. Most adults should get at least 150 minutes of moderate-intensity exercise (any activity that increases your heart rate and causes you to sweat) each week. In addition, most adults need muscle-strengthening exercises on 2 or more days a week.  Maintain a healthy weight. The body mass index (BMI) is a screening tool to identify possible weight problems. It provides an estimate of body fat based on height and weight. Your health care provider can find your BMI, and can help you achieve or maintain a healthy weight.For adults 20 years and older:  A BMI below 18.5 is considered underweight.  A BMI of 18.5 to 24.9 is  normal.  A BMI of 25 to 29.9 is considered overweight.  A BMI of 30 and above is considered obese.  Maintain normal blood lipids and cholesterol levels by exercising and minimizing your intake of saturated fat. Eat a balanced diet with plenty of fruit and vegetables. Blood tests for lipids and cholesterol should begin at age 57 and be repeated every 5 years. If your lipid or cholesterol levels are high, you are over 50, or you are at high risk for heart disease, you may need your cholesterol levels checked more frequently.Ongoing high lipid and cholesterol levels should be treated with medicines if diet and exercise are not working.  If you smoke, find out from your health care provider how to quit. If you do not use tobacco, do not start.  Lung cancer screening is recommended for adults aged 35-80 years who are at high risk for developing lung cancer because of a history of smoking. A yearly low-dose CT scan of the lungs is recommended for people who have at least a 30-pack-year history of smoking and are a current smoker or have quit within the past 15 years. A pack year of smoking is smoking an average of 1 pack of cigarettes a day for 1 year (for example: 1 pack a day for 30 years or 2 packs a day for 15 years). Yearly screening should continue until the smoker has stopped smoking for at least 15 years. Yearly screening should be stopped for people who develop a health problem that would prevent them from having lung cancer treatment.  If  you are pregnant, do not drink alcohol. If you are breastfeeding, be very cautious about drinking alcohol. If you are not pregnant and choose to drink alcohol, do not have more than 1 drink per day. One drink is considered to be 12 ounces (355 mL) of beer, 5 ounces (148 mL) of wine, or 1.5 ounces (44 mL) of liquor.  Avoid use of street drugs. Do not share needles with anyone. Ask for help if you need support or instructions about stopping the use of  drugs.  High blood pressure causes heart disease and increases the risk of stroke. Your blood pressure should be checked at least every 1 to 2 years. Ongoing high blood pressure should be treated with medicines if weight loss and exercise do not work.  If you are 22-13 years old, ask your health care provider if you should take aspirin to prevent strokes.  Diabetes screening involves taking a blood sample to check your fasting blood sugar level. This should be done once every 3 years, after age 45, if you are within normal weight and without risk factors for diabetes. Testing should be considered at a younger age or be carried out more frequently if you are overweight and have at least 1 risk factor for diabetes.  Breast cancer screening is essential preventive care for women. You should practice "breast self-awareness." This means understanding the normal appearance and feel of your breasts and may include breast self-examination. Any changes detected, no matter how small, should be reported to a health care provider. Women in their 64s and 30s should have a clinical breast exam (CBE) by a health care provider as part of a regular health exam every 1 to 3 years. After age 32, women should have a CBE every year. Starting at age 58, women should consider having a mammogram (breast X-ray test) every year. Women who have a family history of breast cancer should talk to their health care provider about genetic screening. Women at a high risk of breast cancer should talk to their health care providers about having an MRI and a mammogram every year.  Breast cancer gene (BRCA)-related cancer risk assessment is recommended for women who have family members with BRCA-related cancers. BRCA-related cancers include breast, ovarian, tubal, and peritoneal cancers. Having family members with these cancers may be associated with an increased risk for harmful changes (mutations) in the breast cancer genes BRCA1 and BRCA2.  Results of the assessment will determine the need for genetic counseling and BRCA1 and BRCA2 testing.  Routine pelvic exams to screen for cancer are no longer recommended for nonpregnant women who are considered low risk for cancer of the pelvic organs (ovaries, uterus, and vagina) and who do not have symptoms. Ask your health care provider if a screening pelvic exam is right for you.  If you have had past treatment for cervical cancer or a condition that could lead to cancer, you need Pap tests and screening for cancer for at least 20 years after your treatment. If Pap tests have been discontinued, your risk factors (such as having a new sexual partner) need to be reassessed to determine if screening should be resumed. Some women have medical problems that increase the chance of getting cervical cancer. In these cases, your health care provider may recommend more frequent screening and Pap tests.  The HPV test is an additional test that may be used for cervical cancer screening. The HPV test looks for the virus that can cause the cell changes on  the cervix. The cells collected during the Pap test can be tested for HPV. The HPV test could be used to screen women aged 59 years and older, and should be used in women of any age who have unclear Pap test results. After the age of 50, women should have HPV testing at the same frequency as a Pap test.  Colorectal cancer can be detected and often prevented. Most routine colorectal cancer screening begins at the age of 54 years and continues through age 86 years. However, your health care provider may recommend screening at an earlier age if you have risk factors for colon cancer. On a yearly basis, your health care provider may provide home test kits to check for hidden blood in the stool. Use of a small camera at the end of a tube, to directly examine the colon (sigmoidoscopy or colonoscopy), can detect the earliest forms of colorectal cancer. Talk to your health  care provider about this at age 38, when routine screening begins. Direct exam of the colon should be repeated every 5-10 years through age 43 years, unless early forms of pre-cancerous polyps or small growths are found.  People who are at an increased risk for hepatitis B should be screened for this virus. You are considered at high risk for hepatitis B if:  You were born in a country where hepatitis B occurs often. Talk with your health care provider about which countries are considered high risk.  Your parents were born in a high-risk country and you have not received a shot to protect against hepatitis B (hepatitis B vaccine).  You have HIV or AIDS.  You use needles to inject street drugs.  You live with, or have sex with, someone who has Hepatitis B.  You get hemodialysis treatment.  You take certain medicines for conditions like cancer, organ transplantation, and autoimmune conditions.  Hepatitis C blood testing is recommended for all people born from [birth year] through 1965 and any individual with known risks for hepatitis C.  Practice safe sex. Use condoms and avoid high-risk sexual practices to reduce the spread of sexually transmitted infections (STIs). STIs include gonorrhea, chlamydia, syphilis, trichomonas, herpes, HPV, and human immunodeficiency virus (HIV). Herpes, HIV, and HPV are viral illnesses that have no cure. They can result in disability, cancer, and death.  You should be screened for sexually transmitted illnesses (STIs) including gonorrhea and chlamydia if:  You are sexually active and are younger than 24 years.  You are older than 24 years and your health care provider tells you that you are at risk for this type of infection.  Your sexual activity has changed since you were last screened and you are at an increased risk for chlamydia or gonorrhea. Ask your health care provider if you are at risk.  If you are at risk of being infected with HIV, it is recommended  that you take a prescription medicine daily to prevent HIV infection. This is called preexposure prophylaxis (PrEP). You are considered at risk if:  You are a heterosexual woman, are sexually active, and are at increased risk for HIV infection.  You take drugs by injection.  You are sexually active with a partner who has HIV.  Talk with your health care provider about whether you are at high risk of being infected with HIV. If you choose to begin PrEP, you should first be tested for HIV. You should then be tested every 3 months for as long as you are taking PrEP.  Osteoporosis  is a disease in which the bones lose minerals and strength with aging. This can result in serious bone fractures or breaks. The risk of osteoporosis can be identified using a bone density scan. Women ages 33 years and over and women at risk for fractures or osteoporosis should discuss screening with their health care providers. Ask your health care provider whether you should take a calcium supplement or vitamin D to reduce the rate of osteoporosis.  Menopause can be associated with physical symptoms and risks. Hormone replacement therapy is available to decrease symptoms and risks. You should talk to your health care provider about whether hormone replacement therapy is right for you.  Use sunscreen. Apply sunscreen liberally and repeatedly throughout the day. You should seek shade when your shadow is shorter than you. Protect yourself by wearing long sleeves, pants, a wide-brimmed hat, and sunglasses year round, whenever you are outdoors.  Once a month, do a whole body skin exam, using a mirror to look at the skin on your back. Tell your health care provider of new moles, moles that have irregular borders, moles that are larger than a pencil eraser, or moles that have changed in shape or color.  Stay current with required vaccines (immunizations).  Influenza vaccine. All adults should be immunized every year.  Tetanus,  diphtheria, and acellular pertussis (Td, Tdap) vaccine. Pregnant women should receive 1 dose of Tdap vaccine during each pregnancy. The dose should be obtained regardless of the length of time since the last dose. Immunization is preferred during the 27th-36th week of gestation. An adult who has not previously received Tdap or who does not know her vaccine status should receive 1 dose of Tdap. This initial dose should be followed by tetanus and diphtheria toxoids (Td) booster doses every 10 years. Adults with an unknown or incomplete history of completing a 3-dose immunization series with Td-containing vaccines should begin or complete a primary immunization series including a Tdap dose. Adults should receive a Td booster every 10 years.  Varicella vaccine. An adult without evidence of immunity to varicella should receive 2 doses or a second dose if she has previously received 1 dose. Pregnant females who do not have evidence of immunity should receive the first dose after pregnancy. This first dose should be obtained before leaving the health care facility. The second dose should be obtained 4-8 weeks after the first dose.  Human papillomavirus (HPV) vaccine. Females aged 13-26 years who have not received the vaccine previously should obtain the 3-dose series. The vaccine is not recommended for use in pregnant females. However, pregnancy testing is not needed before receiving a dose. If a female is found to be pregnant after receiving a dose, no treatment is needed. In that case, the remaining doses should be delayed until after the pregnancy. Immunization is recommended for any person with an immunocompromised condition through the age of 77 years if she did not get any or all doses earlier. During the 3-dose series, the second dose should be obtained 4-8 weeks after the first dose. The third dose should be obtained 24 weeks after the first dose and 16 weeks after the second dose.  Zoster vaccine. One dose  is recommended for adults aged 26 years or older unless certain conditions are present.  Measles, mumps, and rubella (MMR) vaccine. Adults born before [birth year] generally are considered immune to measles and mumps. Adults born in [birth year] or later should have 1 or more doses of MMR vaccine unless there is a contraindication  to the vaccine or there is laboratory evidence of immunity to each of the three diseases. A routine second dose of MMR vaccine should be obtained at least 28 days after the first dose for students attending postsecondary schools, health care workers, or international travelers. People who received inactivated measles vaccine or an unknown type of measles vaccine during 1963-1967 should receive 2 doses of MMR vaccine. People who received inactivated mumps vaccine or an unknown type of mumps vaccine before 1979 and are at high risk for mumps infection should consider immunization with 2 doses of MMR vaccine. For females of childbearing age, rubella immunity should be determined. If there is no evidence of immunity, females who are not pregnant should be vaccinated. If there is no evidence of immunity, females who are pregnant should delay immunization until after pregnancy. Unvaccinated health care workers born before [birth year] who lack laboratory evidence of measles, mumps, or rubella immunity or laboratory confirmation of disease should consider measles and mumps immunization with 2 doses of MMR vaccine or rubella immunization with 1 dose of MMR vaccine.  Pneumococcal 13-valent conjugate (PCV13) vaccine. When indicated, a person who is uncertain of her immunization history and has no record of immunization should receive the PCV13 vaccine. An adult aged 73 years or older who has certain medical conditions and has not been previously immunized should receive 1 dose of PCV13 vaccine. This PCV13 should be followed with a dose of pneumococcal polysaccharide (PPSV23) vaccine. The PPSV23 vaccine dose should be  obtained at least 8 weeks after the dose of PCV13 vaccine. An adult aged 79 years or older who has certain medical conditions and previously received 1 or more doses of PPSV23 vaccine should receive 1 dose of PCV13. The PCV13 vaccine dose should be obtained 1 or more years after the last PPSV23 vaccine dose.  Pneumococcal polysaccharide (PPSV23) vaccine. When PCV13 is also indicated, PCV13 should be obtained first. All adults aged 50 years and older should be immunized. An adult younger than age 58 years who has certain medical conditions should be immunized. Any person who resides in a nursing home or long-term care facility should be immunized. An adult smoker should be immunized. People with an immunocompromised condition and certain other conditions should receive both PCV13 and PPSV23 vaccines. People with human immunodeficiency virus (HIV) infection should be immunized as soon as possible after diagnosis. Immunization during chemotherapy or radiation therapy should be avoided. Routine use of PPSV23 vaccine is not recommended for American Indians, [place] Natives, or people younger than 65 years unless there are medical conditions that require PPSV23 vaccine. When indicated, people who have unknown immunization and have no record of immunization should receive PPSV23 vaccine. One-time revaccination 5 years after the first dose of PPSV23 is recommended for people aged 19-64 years who have chronic kidney failure, nephrotic syndrome, asplenia, or immunocompromised conditions. People who received 1-2 doses of PPSV23 before age 73 years should receive another dose of PPSV23 vaccine at age 9 years or later if at least 5 years have passed since the previous dose. Doses of PPSV23 are not needed for people immunized with PPSV23 at or after age 48 years.  Meningococcal vaccine. Adults with asplenia or persistent complement component deficiencies should receive 2 doses of quadrivalent meningococcal conjugate  (MenACWY-D) vaccine. The doses should be obtained at least 2 months apart. Microbiologists working with certain meningococcal bacteria, [employer] recruits, people at risk during an outbreak, and people who travel to or live in countries with a high rate of  meningitis should be immunized. A first-year college student up through age 105 years who is living in a residence hall should receive a dose if she did not receive a dose on or after her 16th birthday. Adults who have certain high-risk conditions should receive one or more doses of vaccine.  Hepatitis A vaccine. Adults who wish to be protected from this disease, have certain high-risk conditions, work with hepatitis A-infected animals, work in hepatitis A research labs, or travel to or work in countries with a high rate of hepatitis A should be immunized. Adults who were previously unvaccinated and who anticipate close contact with an international adoptee during the first 60 days after arrival in the [place] from a country with a high rate of hepatitis A should be immunized.  Hepatitis B vaccine. Adults who wish to be protected from this disease, have certain high-risk conditions, may be exposed to blood or other infectious body fluids, are household contacts or sex partners of hepatitis B positive people, are clients or workers in certain care facilities, or travel to or work in countries with a high rate of hepatitis B should be immunized.  Haemophilus influenzae type b (Hib) vaccine. A previously unvaccinated person with asplenia or sickle cell disease or having a scheduled splenectomy should receive 1 dose of Hib vaccine. Regardless of previous immunization, a recipient of a hematopoietic stem cell transplant should receive a 3-dose series 6-12 months after her successful transplant. Hib vaccine is not recommended for adults with HIV infection. Preventive Services / Frequency Ages 25 to 39years  Blood pressure check.** / Every 1 to 2  years.  Lipid and cholesterol check.** / Every 5 years beginning at age 18.  Clinical breast exam.** / Every 3 years for women in their 37s and 70s.  BRCA-related cancer risk assessment.** / For women who have family members with a BRCA-related cancer (breast, ovarian, tubal, or peritoneal cancers).  Pap test.** / Every 2 years from ages 3 through 32. Every 3 years starting at age 22 through age 66 or 12 with a history of 3 consecutive normal Pap tests.  HPV screening.** / Every 3 years from ages 65 through ages 20 to 57 with a history of 3 consecutive normal Pap tests.  Hepatitis C blood test.** / For any individual with known risks for hepatitis C.  Skin self-exam. / Monthly.  Influenza vaccine. / Every year.  Tetanus, diphtheria, and acellular pertussis (Tdap, Td) vaccine.** / Consult your health care provider. Pregnant women should receive 1 dose of Tdap vaccine during each pregnancy. 1 dose of Td every 10 years.  Varicella vaccine.** / Consult your health care provider. Pregnant females who do not have evidence of immunity should receive the first dose after pregnancy.  HPV vaccine. / 3 doses over 6 months, if 2 and younger. The vaccine is not recommended for use in pregnant females. However, pregnancy testing is not needed before receiving a dose.  Measles, mumps, rubella (MMR) vaccine.** / You need at least 1 dose of MMR if you were born in 1957 or later. You may also need a 2nd dose. For females of childbearing age, rubella immunity should be determined. If there is no evidence of immunity, females who are not pregnant should be vaccinated. If there is no evidence of immunity, females who are pregnant should delay immunization until after pregnancy.  Pneumococcal 13-valent conjugate (PCV13) vaccine.** / Consult your health care provider.  Pneumococcal polysaccharide (PPSV23) vaccine.** / 1 to 2 doses if you smoke  cigarettes or if you have certain conditions.  Meningococcal  vaccine.** / 1 dose if you are age 42 to 69 years and a Market researcher living in a residence hall, or have one of several medical conditions, you need to get vaccinated against meningococcal disease. You may also need additional booster doses.  Hepatitis A vaccine.** / Consult your health care provider.  Hepatitis B vaccine.** / Consult your health care provider.  Haemophilus influenzae type b (Hib) vaccine.** / Consult your health care provider. Ages 35 to 64years  Blood pressure check.** / Every 1 to 2 years.  Lipid and cholesterol check.** / Every 5 years beginning at age 38 years.  Lung cancer screening. / Every year if you are aged 6-80 years and have a 30-pack-year history of smoking and currently smoke or have quit within the past 15 years. Yearly screening is stopped once you have quit smoking for at least 15 years or develop a health problem that would prevent you from having lung cancer treatment.  Clinical breast exam.** / Every year after age 59 years.  BRCA-related cancer risk assessment.** / For women who have family members with a BRCA-related cancer (breast, ovarian, tubal, or peritoneal cancers).  Mammogram.** / Every year beginning at age 33 years and continuing for as long as you are in good health. Consult with your health care provider.  Pap test.** / Every 3 years starting at age 71 years through age 57 or 55 years with a history of 3 consecutive normal Pap tests.  HPV screening.** / Every 3 years from ages 54 years through ages 59 to 72 years with a history of 3 consecutive normal Pap tests.  Fecal occult blood test (FOBT) of stool. / Every year beginning at age 48 years and continuing until age 70 years. You may not need to do this test if you get a colonoscopy every 10 years.  Flexible sigmoidoscopy or colonoscopy.** / Every 5 years for a flexible sigmoidoscopy or every 10 years for a colonoscopy beginning at age 68 years and continuing until age 29  years.  Hepatitis C blood test.** / For all people born from [birth year] through 1966 and any individual with known risks for hepatitis C.  Skin self-exam. / Monthly.  Influenza vaccine. / Every year.  Tetanus, diphtheria, and acellular pertussis (Tdap/Td) vaccine.** / Consult your health care provider. Pregnant women should receive 1 dose of Tdap vaccine during each pregnancy. 1 dose of Td every 10 years.  Varicella vaccine.** / Consult your health care provider. Pregnant females who do not have evidence of immunity should receive the first dose after pregnancy.  Zoster vaccine.** / 1 dose for adults aged 19 years or older.  Measles, mumps, rubella (MMR) vaccine.** / You need at least 1 dose of MMR if you were born in 1957 or later. You may also need a 2nd dose. For females of childbearing age, rubella immunity should be determined. If there is no evidence of immunity, females who are not pregnant should be vaccinated. If there is no evidence of immunity, females who are pregnant should delay immunization until after pregnancy.  Pneumococcal 13-valent conjugate (PCV13) vaccine.** / Consult your health care provider.  Pneumococcal polysaccharide (PPSV23) vaccine.** / 1 to 2 doses if you smoke cigarettes or if you have certain conditions.  Meningococcal vaccine.** / Consult your health care provider.  Hepatitis A vaccine.** / Consult your health care provider.  Hepatitis B vaccine.** / Consult your health care provider.  Haemophilus influenzae type b (  Hib) vaccine.** / Consult your health care provider. Ages 11 years and over  Blood pressure check.** / Every 1 to 2 years.  Lipid and cholesterol check.** / Every 5 years beginning at age 25 years.  Lung cancer screening. / Every year if you are aged 73-80 years and have a 30-pack-year history of smoking and currently smoke or have quit within the past 15 years. Yearly screening is stopped once you have quit smoking for at least 15 years or  develop a health problem that would prevent you from having lung cancer treatment.  Clinical breast exam.** / Every year after age 59 years.  BRCA-related cancer risk assessment.** / For women who have family members with a BRCA-related cancer (breast, ovarian, tubal, or peritoneal cancers).  Mammogram.** / Every year beginning at age 67 years and continuing for as long as you are in good health. Consult with your health care provider.  Pap test.** / Every 3 years starting at age 82 years through age 62 or 54 years with 3 consecutive normal Pap tests. Testing can be stopped between 65 and 70 years with 3 consecutive normal Pap tests and no abnormal Pap or HPV tests in the past 10 years.  HPV screening.** / Every 3 years from ages 76 years through ages 36 or 19 years with a history of 3 consecutive normal Pap tests. Testing can be stopped between 65 and 70 years with 3 consecutive normal Pap tests and no abnormal Pap or HPV tests in the past 10 years.  Fecal occult blood test (FOBT) of stool. / Every year beginning at age 67 years and continuing until age 20 years. You may not need to do this test if you get a colonoscopy every 10 years.  Flexible sigmoidoscopy or colonoscopy.** / Every 5 years for a flexible sigmoidoscopy or every 10 years for a colonoscopy beginning at age 12 years and continuing until age 36 years.  Hepatitis C blood test.** / For all people born from [birth year] through 1966 and any individual with known risks for hepatitis C.  Osteoporosis screening.** / A one-time screening for women ages 54 years and over and women at risk for fractures or osteoporosis.  Skin self-exam. / Monthly.  Influenza vaccine. / Every year.  Tetanus, diphtheria, and acellular pertussis (Tdap/Td) vaccine.** / 1 dose of Td every 10 years.  Varicella vaccine.** / Consult your health care provider.  Zoster vaccine.** / 1 dose for adults aged 7 years or older.  Pneumococcal 13-valent conjugate  (PCV13) vaccine.** / Consult your health care provider.  Pneumococcal polysaccharide (PPSV23) vaccine.** / 1 dose for all adults aged 2 years and older.  Meningococcal vaccine.** / Consult your health care provider.  Hepatitis A vaccine.** / Consult your health care provider.  Hepatitis B vaccine.** / Consult your health care provider.  Haemophilus influenzae type b (Hib) vaccine.** / Consult your health care provider. ** Family history and personal history of risk and conditions may change your health care provider's recommendations. Document Released: 07/04/2001 Document Revised: 05/13/2013 Document Reviewed: 10/03/2010 [facility] Patient Information 2015 [place], [place]. This information is not intended to replace advice given to you by your health care provider. Make sure you discuss any questions you have with your health care provider.

## 2013-11-05 NOTE — Assessment & Plan Note (Signed)
Unfortunately continues to smoke, Encouraged complete cessation. Discussed need to quit as relates to risk of numerous cancers, cardiac and pulmonary disease as well as neurologic complications. Counseled for greater than 3 minutes. Smoking less than a pack per day

## 2013-11-05 NOTE — Assessment & Plan Note (Signed)
Stable on Citalopram no changes today

## 2013-11-05 NOTE — Assessment & Plan Note (Signed)
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

## 2013-11-05 NOTE — Progress Notes (Signed)
Pre visit review using our clinic review tool, if applicable. No additional management support is needed unless otherwise documented below in the visit note. 

## 2013-11-05 NOTE — Assessment & Plan Note (Signed)
Chronic pain in neck and low back is a canditate for surgical intervention but unable to proceed at this time

## 2013-11-05 NOTE — Assessment & Plan Note (Deleted)
Zoster May '14 with resolution of rash by June 10th but persistent severe pain.  Along lateral right chest wall, pain improved some but numbness and tingling persist

## 2013-11-05 NOTE — Assessment & Plan Note (Signed)
Zoster May '14 with resolution of rash by June 10th but persistent severe pain.  Along lateral right chest wall, pain improved some but numbness and tingling persist

## 2013-11-05 NOTE — Assessment & Plan Note (Addendum)
Has been flared over past 6-8 mnths, sees Dr Tobie Lords. Hands have been the worst, has had numerous injections. Pain and swelling have improved some in hands over past 2 weeks. Right shoulder is the persistent pain now. Has minimally responded to steroid shots. Diagnosed at roughly 35. Receiving Orencia injections weekly. Is getting ready to start MTX oral pills (4) weekly and folic acid.

## 2013-11-05 NOTE — Assessment & Plan Note (Signed)
Manageable with Citalopram

## 2013-11-11 ENCOUNTER — Telehealth: Payer: Self-pay | Admitting: Family Medicine

## 2013-11-11 NOTE — Telephone Encounter (Signed)
Patient is requesting call back to discuss her shoulder, she is going to see a Psychologist, sport and exercise tomorrow

## 2013-11-12 ENCOUNTER — Telehealth: Payer: Self-pay | Admitting: Family Medicine

## 2013-11-12 DIAGNOSIS — M069 Rheumatoid arthritis, unspecified: Secondary | ICD-10-CM

## 2013-11-12 NOTE — Telephone Encounter (Signed)
Patient called back regarding this. She states that she has been taking 8-9 pills a day. She states at this rate she will run out before her surgery or before next refill is due.

## 2013-11-12 NOTE — Telephone Encounter (Signed)
Patient called back stating that Dr. Susie Kelly office was supposed to call today to see if Dr. Charlett Kelly could increase this medication until her surgery.

## 2013-11-12 NOTE — Telephone Encounter (Signed)
Dr Onnie Graham will operate on her shoulder.  He would like Dr Charlett Blake to increase the dosage on her pain meds or give her something stronger until after surgery.

## 2013-11-12 NOTE — Telephone Encounter (Signed)
OK to allow 8 percocet 10/325 in a day until her surgery and then can drop baack to 6 tabs. Change sig to 1 to 2 tabs po q 6 hours prn pain, OK to write rx if she is due for 240 #.

## 2013-11-13 ENCOUNTER — Encounter: Payer: Self-pay | Admitting: Family Medicine

## 2013-11-13 NOTE — Telephone Encounter (Signed)
See note from 11/12/2013.

## 2013-11-13 NOTE — Telephone Encounter (Signed)
Patient informed and states that there was an error in communication between Dr. Susie Cassette office and ours; the request she wants is for a new Stronger medication [than the Percocet 10-325 mg; informed pt that is the max on Percocet], explained that if this was to be granted that she would be responsible for bringing the last Percocet Rx given to her before she could receive another prescription for a controlled substance; pt understood. Pt's surgery is scheduled for December 02, 2013 and pt also would like to know if you could call her personally, just to "talk for a couple of minutes". Informed pt that I would forward her request to Ezzie Senat/SLS Please Advise.

## 2013-11-13 NOTE — Telephone Encounter (Signed)
Per EMR medication list, patient was given prescription for #180 on November 05, 2013 that was not to be filled before November 23, 2013. Please advise on this, as she already has been given this Rx for #180 that is not due to be filled for [10] more days/SLS

## 2013-11-13 NOTE — Telephone Encounter (Signed)
So she cannnot have refill til 12/23/2013.

## 2013-11-13 NOTE — Progress Notes (Signed)
Patient ID: Jennifer Kelly, female   DOB: 01-02-1961, 53 y.o.   MRN: [id number] Jennifer Kelly [id number] December 25, 1960 11/13/2013      Progress Note-Follow Up  Subjective  Chief Complaint  Chief Complaint  Patient presents with  . Establish Care    new patient- [place] from [facility]    HPI  Patient is a 53 year old female in today for routine medical care.  Is. Denies CP/palp/SOB/HA/congestion/fevers/GI or GU c/o. Taking meds as prescribed in today for new patient appt. No recent illness but her PMD is retired. No cute concerns   Past Medical History  Diagnosis Date  . Arthritis   . Hypertension   . Shingles 2014  . Chicken pox as a child  . Mumps as a child  . History of chicken pox   . H/O mumps     Past Surgical History  Procedure Laterality Date  . Tooth extraction  05/2012  . Wisdom tooth extraction  52 yrs old  . Back surgery  2003  . Rotater cuff  20 yrs ago    left shoulder    Family History  Problem Relation Age of Onset  . Parkinson's disease Mother   . Heart disease Mother   . Hypertension Mother   . Diabetes Father     type 2  . Hypertension Father   . Vascular Disease Brother   . Hypertension Brother   . Diabetes Brother     History   Social History  . Marital Status: Married    Spouse Name: N/A    Number of Children: N/A  . Years of Education: N/A   Occupational History  . Not on file.   Social History Main Topics  . Smoking status: Current Every Day Smoker  . Smokeless tobacco: Not on file  . Alcohol Use: Yes     Comment: occ wine  . Drug Use: No  . Sexual Activity: Not on file   Other Topics Concern  . Not on file   Social History Narrative  . No narrative on file    Current Outpatient Prescriptions on File Prior to Visit  Medication Sig Dispense Refill  . albuterol (PROVENTIL HFA;VENTOLIN HFA) 108 (90 BASE) MCG/ACT inhaler Inhale 2 puffs into the lungs every 6 (six) hours as needed for wheezing.  18 g  11  . citalopram  (CELEXA) 20 MG tablet take 1 tablet by mouth once daily  30 tablet  10  . diltiazem (CARDIZEM CD) 240 MG 24 hr capsule Take 1 capsule (240 mg total) by mouth daily.  30 capsule  11  . gabapentin (NEURONTIN) 600 MG tablet Take 1 tablet (600 mg total) by mouth 3 (three) times daily.  90 tablet  4  . losartan-hydrochlorothiazide (HYZAAR) 50-12.5 MG per tablet Take 1 tablet by mouth daily.  30 tablet  11   No current facility-administered medications on file prior to visit.    No Known Allergies  Review of Systems  Review of Systems  Constitutional: Negative for fever and malaise/fatigue.  HENT: Negative for congestion.   Eyes: Negative for discharge.  Respiratory: Negative for shortness of breath.   Cardiovascular: Negative for chest pain, palpitations and leg swelling.  Gastrointestinal: Negative for nausea, abdominal pain and diarrhea.  Genitourinary: Negative for dysuria.  Musculoskeletal: Negative for falls.  Skin: Negative for rash.  Neurological: Negative for loss of consciousness and headaches.  Endo/Heme/Allergies: Negative for polydipsia.  Psychiatric/Behavioral: Negative for depression and suicidal ideas. The patient is not nervous/anxious  and does not have insomnia.     Objective  BP 132/92  Pulse 86  Temp(Src) 98.7 F (37.1 C) (Oral)  Ht 5\' 9"  (1.753 m)  Wt 242 lb 1.3 oz (109.807 kg)  BMI 35.73 kg/m2  SpO2 96%  Physical Exam  Physical Exam  Constitutional: She is oriented to person, place, and time and well-developed, well-nourished, and in no distress. No distress.  HENT:  Head: Normocephalic and atraumatic.  Right Ear: External ear normal.  Left Ear: External ear normal.  Nose: Nose normal.  Mouth/Throat: Oropharynx is clear and moist. No oropharyngeal exudate.  Eyes: Conjunctivae are normal. Pupils are equal, round, and reactive to light. Right eye exhibits no discharge. Left eye exhibits no discharge. No scleral icterus.  Neck: Normal range of motion.  Neck supple. No thyromegaly present.  Cardiovascular: Normal rate, regular rhythm, normal heart sounds and intact distal pulses.   No murmur heard. Pulmonary/Chest: Effort normal and breath sounds normal. No respiratory distress. She has no wheezes. She has no rales.  Abdominal: Soft. Bowel sounds are normal. She exhibits no distension and no mass. There is no tenderness.  Musculoskeletal: Normal range of motion. She exhibits no edema and no tenderness.  Lymphadenopathy:    She has no cervical adenopathy.  Neurological: She is alert and oriented to person, place, and time. She has normal reflexes. No cranial nerve deficit. Coordination normal.  Skin: Skin is warm and dry. No rash noted. She is not diaphoretic.  Psychiatric: Mood, memory and affect normal.    Lab Results  Component Value Date   TSH 0.51 10/08/2009   No results found for this basename: WBC, HGB, HCT, MCV, PLT   No results found for this basename: CREATININE, BUN, NA, K, CL, CO2   No results found for this basename: ALT, AST, GGT, ALKPHOS, BILITOT   Lab Results  Component Value Date   CHOL 179 10/08/2009   Lab Results  Component Value Date   HDL 57.70 10/08/2009   Lab Results  Component Value Date   LDLCALC 90 10/08/2009   Lab Results  Component Value Date   TRIG 157.0* 10/08/2009   Lab Results  Component Value Date   CHOLHDL 3 10/08/2009     Assessment & Plan  MENIERE'S DISEASE No recent trouble/vertigo but 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  Along lateral right chest wall, pain improved some but numbness and tingling persist  Rheumatoid arthritis(714.0) Has been flared over past 6-8 mnths, sees Dr Tobie Lords. Hands have been the worst, has had numerous injections. Pain and swelling have improved some in hands over past 2 weeks. Right shoulder is the  persistent pain now. Has minimally responded to steroid shots. Diagnosed at roughly 39. Receiving Orencia injections weekly. Is getting ready to start MTX oral pills (4) weekly and folic acid.  LOW BACK PAIN Chronic pain in neck and low back is a canditate for surgical intervention but unable to proceed at this time  OBESITY, CLASS II Zoster May '14 with resolution of rash by June 10th but persistent severe pain.  Along lateral right chest wall, pain improved some but numbness and tingling persist  Depression with anxiety Stable on Citalopram no changes today  TOBACCO ABUSE Unfortunately continues to smoke, Encouraged complete cessation. Discussed need to quit as relates to risk of numerous cancers, cardiac and pulmonary disease  as well as neurologic complications. Counseled for greater than 3 minutes. Smoking less than a pack per day  POSTTRAUMATIC STRESS DISORDER Manageable with Citalopram  Hypertension Well controlled, no changes to meds. Encouraged heart healthy diet such as the DASH diet and exercise as tolerated.

## 2013-11-13 NOTE — Telephone Encounter (Signed)
Patient requesting call back to discuss surgery and medication

## 2013-11-13 NOTE — Assessment & Plan Note (Signed)
Well controlled, no changes to meds. Encouraged heart healthy diet such as the DASH diet and exercise as tolerated.  °

## 2013-11-14 ENCOUNTER — Other Ambulatory Visit: Payer: Self-pay | Admitting: Family Medicine

## 2013-11-14 DIAGNOSIS — M549 Dorsalgia, unspecified: Secondary | ICD-10-CM

## 2013-11-14 NOTE — Telephone Encounter (Signed)
Patient informed, understood & agreed; please move forward with pain mgt referral/SLS

## 2013-11-14 NOTE — Telephone Encounter (Signed)
Cannot change pain meds, can refer to pain management if she would like

## 2013-11-17 MED ORDER — OXYCODONE-ACETAMINOPHEN 10-325 MG PO TABS: 2.0000 | ORAL_TABLET | Freq: Four times a day (QID) | ORAL | Status: DC | PRN

## 2013-11-17 NOTE — Telephone Encounter (Signed)
She would like the original rx Dr Charlett Blake agreed to for 240 of the percocet to keep her till surgery.  She has the hard copy of the rx for 180 to return.  She is aware she cannot fill this till 11-23-2013  Her surgery is scheduled for 12-02-2013 and there is no way she will get into pain management prior to that.

## 2013-11-17 NOTE — Telephone Encounter (Signed)
Ok to replace the Percocet rx, same strength, change to 2 tabs po q 6 hours prn pain, disp #240, fill on or after 7/5

## 2013-11-17 NOTE — Telephone Encounter (Signed)
Pt informed and rx at front desk

## 2013-11-21 ENCOUNTER — Other Ambulatory Visit: Payer: Self-pay | Admitting: Family Medicine

## 2013-12-10 ENCOUNTER — Encounter: Payer: Self-pay | Admitting: Physical Medicine & Rehabilitation

## 2013-12-30 ENCOUNTER — Other Ambulatory Visit: Payer: Self-pay | Admitting: Family Medicine

## 2013-12-30 DIAGNOSIS — M069 Rheumatoid arthritis, unspecified: Secondary | ICD-10-CM

## 2013-12-30 NOTE — Telephone Encounter (Signed)
I am willing to write just a one month supply of her Percocet. What pain patch is she on?

## 2013-12-30 NOTE — Telephone Encounter (Signed)
Please advise 

## 2013-12-30 NOTE — Telephone Encounter (Signed)
Patient states that she does not see Dr. Jamey Reas mgmt) until 01/27/14. She would like to know if Dr. Charlett Blake would refill her pain patches and percocet until then. Patient states that she is due for refill now.

## 2013-12-31 NOTE — Telephone Encounter (Signed)
Patient called back regarding this. She states that she is on phentenol(sp?) patches.

## 2014-01-01 NOTE — Telephone Encounter (Signed)
Please advise 

## 2014-01-01 NOTE — Telephone Encounter (Signed)
Patient called back regarding this. I told her that we would call her when both rx's are ready

## 2014-01-01 NOTE — Telephone Encounter (Signed)
I am willing to fill her Fentanyl also for only 1 month but I need to know the strength and how often she changes it.

## 2014-01-01 NOTE — Telephone Encounter (Signed)
Spoke with pt she takes [place] and changes every 72hrs

## 2014-01-02 ENCOUNTER — Telehealth: Payer: Self-pay | Admitting: Family

## 2014-01-02 DIAGNOSIS — M069 Rheumatoid arthritis, unspecified: Secondary | ICD-10-CM

## 2014-01-02 MED ORDER — OXYCODONE-ACETAMINOPHEN 10-325 MG PO TABS: 2.0000 | ORAL_TABLET | Freq: Four times a day (QID) | ORAL | Status: DC | PRN

## 2014-01-02 NOTE — Telephone Encounter (Signed)
NP, Jennifer Kelly gave pt Oxycodone supply to get through the weekend. Pt states she has appt with Dr Charlett Blake on Monday and will discuss further refills with her at that time. NP forwarded Controlled Substance database report to MD for review.

## 2014-01-02 NOTE — Telephone Encounter (Signed)
Pt picked up 240 tabs oxycodone from Dr. Charlett Blake on 7/5. She also pickup up additional 80 tabs oxycodone on 8/2 prescribed by Dr. Onnie Graham and a 30 day supply of fentanyl on 7/25 prescribed by Dr. Charlett Blake.  I will defer refills to Dr. Charlett Blake upon her return on Monday.

## 2014-01-02 NOTE — Telephone Encounter (Signed)
Opened in error

## 2014-01-02 NOTE — Telephone Encounter (Signed)
Jennifer Kelly,  Can you sign scripts as pended per Dr Frederik Pear ok below?

## 2014-01-02 NOTE — Telephone Encounter (Signed)
OK to write Fentanyl 50 mcg ptach apply topically change every 72 hours, disp # 10, also can give one more month of Percocet 10/325, same sig and number in [facility]

## 2014-01-05 ENCOUNTER — Ambulatory Visit (INDEPENDENT_AMBULATORY_CARE_PROVIDER_SITE_OTHER): Payer: 59 | Admitting: Family Medicine

## 2014-01-05 ENCOUNTER — Telehealth: Payer: Self-pay

## 2014-01-05 ENCOUNTER — Encounter: Payer: Self-pay | Admitting: Family Medicine

## 2014-01-05 VITALS — BP 108/82 | HR 89 | Temp 97.9°F | Ht 69.0 in | Wt 247.0 lb

## 2014-01-05 DIAGNOSIS — J209 Acute bronchitis, unspecified: Secondary | ICD-10-CM

## 2014-01-05 DIAGNOSIS — I10 Essential (primary) hypertension: Secondary | ICD-10-CM

## 2014-01-05 DIAGNOSIS — F17200 Nicotine dependence, unspecified, uncomplicated: Secondary | ICD-10-CM

## 2014-01-05 DIAGNOSIS — J208 Acute bronchitis due to other specified organisms: Secondary | ICD-10-CM

## 2014-01-05 DIAGNOSIS — E669 Obesity, unspecified: Secondary | ICD-10-CM

## 2014-01-05 DIAGNOSIS — J40 Bronchitis, not specified as acute or chronic: Secondary | ICD-10-CM | POA: Insufficient documentation

## 2014-01-05 DIAGNOSIS — M069 Rheumatoid arthritis, unspecified: Secondary | ICD-10-CM

## 2014-01-05 DIAGNOSIS — R52 Pain, unspecified: Secondary | ICD-10-CM

## 2014-01-05 HISTORY — DX: Acute bronchitis, unspecified: J20.9

## 2014-01-05 MED ORDER — FENTANYL 50 MCG/HR TD PT72: 50.0000 ug | MEDICATED_PATCH | TRANSDERMAL | Status: DC

## 2014-01-05 MED ORDER — OXYCODONE-ACETAMINOPHEN 10-325 MG PO TABS: 2.0000 | ORAL_TABLET | Freq: Four times a day (QID) | ORAL | Status: DC | PRN

## 2014-01-05 MED ORDER — OXYCODONE-ACETAMINOPHEN 10-325 MG PO TABS
2.0000 | ORAL_TABLET | Freq: Four times a day (QID) | ORAL | Status: DC | PRN
Start: 2014-01-05 — End: 2014-01-30

## 2014-01-05 NOTE — Progress Notes (Signed)
Patient ID: Jennifer Kelly, female   DOB: Dec 18, 1960, 53 y.o.   MRN: [id number] ROBECCA FULGHAM [id number] [birth year] 01/05/2014      Progress Note-Follow Up  Subjective  Chief Complaint  Chief Complaint  Patient presents with  . pulled muscles    in back and chest    HPI  Patient is a 53 year old female in today for routine medical care. Patient is in today with increased pain in her back and she describes her left chest wall and. It is similar to a pain flare prior to surgery in the past. No falls or injury. She notes she is struggling with some congestion and cough but thinks that has worsened her pain. No fevers or chills.  Past Medical History  Diagnosis Date  . Arthritis   . Hypertension   . Shingles 2014  . Chicken pox as a child  . Mumps as a child  . History of chicken pox   . H/O mumps     Past Surgical History  Procedure Laterality Date  . Tooth extraction  05/2012  . Wisdom tooth extraction  53 yrs old  . Back surgery  2003  . Rotater cuff  20 yrs ago    left shoulder    Family History  Problem Relation Age of Onset  . Parkinson's disease Mother   . Heart disease Mother   . Hypertension Mother   . Diabetes Father     type 2  . Hypertension Father   . Vascular Disease Brother   . Hypertension Brother   . Diabetes Brother     History   Social History  . Marital Status: Married    Spouse Name: N/A    Number of Children: N/A  . Years of Education: N/A   Occupational History  . Not on file.   Social History Main Topics  . Smoking status: Current Every Day Smoker  . Smokeless tobacco: Not on file  . Alcohol Use: Yes     Comment: occ wine  . Drug Use: No  . Sexual Activity: Not on file   Other Topics Concern  . Not on file   Social History Narrative  . No narrative on file    Current Outpatient Prescriptions on File Prior to Visit  Medication Sig Dispense Refill  . abatacept (ORENCIA) 250 MG injection Inject into the vein once a week.       . citalopram (CELEXA) 20 MG tablet take 1 tablet by mouth once daily  30 tablet  10  . diltiazem (CARDIZEM CD) 240 MG 24 hr capsule Take 1 capsule (240 mg total) by mouth daily.  30 capsule  11  . gabapentin (NEURONTIN) 600 MG tablet Take 1 tablet (600 mg total) by mouth 3 (three) times daily.  90 tablet  4  . losartan-hydrochlorothiazide (HYZAAR) 50-12.5 MG per tablet Take 1 tablet by mouth daily.  30 tablet  11  . oxyCODONE-acetaminophen (PERCOCET) 10-325 MG per tablet Take 2 tablets by mouth every 6 (six) hours as needed for pain. Take 1 tablet by mouth 6 times a day as needed for pain. MAY FILL ON OR AFTER 11/23/13  20 tablet  0  . VENTOLIN HFA 108 (90 BASE) MCG/ACT inhaler inhale 2 puffs by mouth every 6 hours if needed  18 g  4   No current facility-administered medications on file prior to visit.    No Known Allergies  Review of Systems  Review of Systems  Constitutional: Negative for  fever and malaise/fatigue.  HENT: Negative for congestion.   Eyes: Negative for discharge.  Respiratory: Negative for shortness of breath.   Cardiovascular: Negative for chest pain, palpitations and leg swelling.  Gastrointestinal: Negative for nausea, abdominal pain and diarrhea.  Genitourinary: Negative for dysuria.  Musculoskeletal: Negative for falls.  Skin: Negative for rash.  Neurological: Negative for loss of consciousness and headaches.  Endo/Heme/Allergies: Negative for polydipsia.  Psychiatric/Behavioral: Negative for depression and suicidal ideas. The patient is not nervous/anxious and does not have insomnia.     Objective  BP 108/82  Pulse 89  Temp(Src) 97.9 F (36.6 C) (Oral)  Ht 5\' 9"  (1.753 m)  Wt 247 lb 0.6 oz (112.057 kg)  BMI 36.46 kg/m2  SpO2 94%  Physical Exam  Physical Exam  Constitutional: She is oriented to person, place, and time and well-developed, well-nourished, and in no distress. No distress.  HENT:  Head: Normocephalic and atraumatic.  Eyes:  Conjunctivae are normal.  Neck: Neck supple. No thyromegaly present.  Cardiovascular: Normal rate, regular rhythm and normal heart sounds.   No murmur heard. Pulmonary/Chest: Effort normal and breath sounds normal. She has no wheezes.  Abdominal: She exhibits no distension and no mass.  Musculoskeletal: She exhibits no edema.  Lymphadenopathy:    She has no cervical adenopathy.  Neurological: She is alert and oriented to person, place, and time.  Skin: Skin is warm and dry. No rash noted. She is not diaphoretic.  Psychiatric: Memory, affect and judgment normal.    Lab Results  Component Value Date   TSH 0.51 10/08/2009    Lab Results  Component Value Date   CHOL 179 10/08/2009   Lab Results  Component Value Date   HDL 57.70 10/08/2009   Lab Results  Component Value Date   LDLCALC 90 10/08/2009   Lab Results  Component Value Date   TRIG 157.0* 10/08/2009   Lab Results  Component Value Date   CHOLHDL 3 10/08/2009     Assessment & Plan  Hypertension Well controlled, no changes to meds. Encouraged heart healthy diet such as the DASH diet and exercise as tolerated.   OBESITY, CLASS II Encouraged DASH diet, decrease po intake and increase exercise as tolerated. Needs 7-8 hours of sleep nightly. Avoid trans fats, eat small, frequent meals every 4-5 hours with lean proteins, complex carbs and healthy fats. Minimize simple carbs  Acute bronchitis Started on Cefdinir, Encouraged increased rest and hydration, add probiotics, zinc such as Coldeze or Xicam. Treat fevers as needed  TOBACCO ABUSE Encouraged complete cessation. Discussed need to quit as relates to risk of numerous cancers, cardiac and pulmonary disease as well as neurologic complications. Counseled for greater than 3 minutes  Pain Atypical chest pain and left sided back pain. Allowed refills on her pain meds, has consultation with pain management in a couple weeks

## 2014-01-05 NOTE — Assessment & Plan Note (Addendum)
Started on Ciprofloxacin, Encouraged increased rest and hydration, add probiotics, zinc such as Coldeze or Xicam. Treat fevers as needed

## 2014-01-05 NOTE — Assessment & Plan Note (Signed)
Well controlled, no changes to meds. Encouraged heart healthy diet such as the DASH diet and exercise as tolerated.  °

## 2014-01-05 NOTE — Telephone Encounter (Signed)
Dr Charlett Blake printed new RX and gave to Martyn Ehrich to notify patient

## 2014-01-05 NOTE — Progress Notes (Signed)
Pre visit review using our clinic review tool, if applicable. No additional management support is needed unless otherwise documented below in the visit note. 

## 2014-01-05 NOTE — Telephone Encounter (Signed)
Pharmacist left a message stating that patient dropped off a percocet RX 10-325 mg.  (Has 2 different directions) Take 2 tabs every 6 hours prn and 1 tab 6 times a day  The pharmacy needs clarification for the new RX.   Please advise?  Callback number is [phone]  Are you aware that patient is receiving Percocet from other providers?

## 2014-01-05 NOTE — Assessment & Plan Note (Signed)
Encouraged DASH diet, decrease po intake and increase exercise as tolerated. Needs 7-8 hours of sleep nightly. Avoid trans fats, eat small, frequent meals every 4-5 hours with lean proteins, complex carbs and healthy fats. Minimize simple carbs 

## 2014-01-05 NOTE — Patient Instructions (Addendum)
Add Steroids tomorrow if no better Acute Bronchitis Bronchitis is inflammation of the airways that extend from the windpipe into the lungs (bronchi). The inflammation often causes mucus to develop. This leads to a cough, which is the most common symptom of bronchitis.  In acute bronchitis, the condition usually develops suddenly and goes away over time, usually in a couple weeks. Smoking, allergies, and asthma can make bronchitis worse. Repeated episodes of bronchitis may cause further lung problems.  CAUSES Acute bronchitis is most often caused by the same virus that causes a cold. The virus can spread from person to person (contagious) through coughing, sneezing, and touching contaminated objects. SIGNS AND SYMPTOMS   Cough.   Fever.   Coughing up mucus.   Body aches.   Chest congestion.   Chills.   Shortness of breath.   Sore throat.  DIAGNOSIS  Acute bronchitis is usually diagnosed through a physical exam. Your health care provider will also ask you questions about your medical history. Tests, such as chest X-rays, are sometimes done to rule out other conditions.  TREATMENT  Acute bronchitis usually goes away in a couple weeks. Oftentimes, no medical treatment is necessary. Medicines are sometimes given for relief of fever or cough. Antibiotic medicines are usually not needed but may be prescribed in certain situations. In some cases, an inhaler may be recommended to help reduce shortness of breath and control the cough. A cool mist vaporizer may also be used to help thin bronchial secretions and make it easier to clear the chest.  HOME CARE INSTRUCTIONS  Get plenty of rest.   Drink enough fluids to keep your urine clear or pale yellow (unless you have a medical condition that requires fluid restriction). Increasing fluids may help thin your respiratory secretions (sputum) and reduce chest congestion, and it will prevent dehydration.   Take medicines only as directed by  your health care provider.  If you were prescribed an antibiotic medicine, finish it all even if you start to feel better.  Avoid smoking and secondhand smoke. Exposure to cigarette smoke or irritating chemicals will make bronchitis worse. If you are a smoker, consider using nicotine gum or skin patches to help control withdrawal symptoms. Quitting smoking will help your lungs heal faster.   Reduce the chances of another bout of acute bronchitis by washing your hands frequently, avoiding people with cold symptoms, and trying not to touch your hands to your mouth, nose, or eyes.   Keep all follow-up visits as directed by your health care provider.  SEEK MEDICAL CARE IF: Your symptoms do not improve after 1 week of treatment.  SEEK IMMEDIATE MEDICAL CARE IF:  You develop an increased fever or chills.   You have chest pain.   You have severe shortness of breath.  You have bloody sputum.   You develop dehydration.  You faint or repeatedly feel like you are going to pass out.  You develop repeated vomiting.  You develop a severe headache. MAKE SURE YOU:   Understand these instructions.  Will watch your condition.  Will get help right away if you are not doing well or get worse. Document Released: 06/15/2004 Document Revised: 09/22/2013 Document Reviewed: 10/29/2012 [facility] Patient Information 2015 [place], [place]. This information is not intended to replace advice given to you by your health care provider. Make sure you discuss any questions you have with your health care provider.

## 2014-01-05 NOTE — Telephone Encounter (Signed)
Seen 8/17

## 2014-01-05 NOTE — Telephone Encounter (Signed)
So her rx should read 2 tabs every 6 hours as needed for severe pain,

## 2014-01-06 ENCOUNTER — Telehealth: Payer: Self-pay | Admitting: Family Medicine

## 2014-01-06 ENCOUNTER — Other Ambulatory Visit: Payer: Self-pay

## 2014-01-06 ENCOUNTER — Other Ambulatory Visit: Payer: Self-pay | Admitting: Family Medicine

## 2014-01-06 MED ORDER — CIPROFLOXACIN HCL 500 MG PO TABS: 500.0000 mg | ORAL_TABLET | Freq: Two times a day (BID) | ORAL | Status: DC

## 2014-01-06 NOTE — Addendum Note (Signed)
Addended by: Varney Daily on: 01/06/2014 07:17 PM   Modules accepted: Orders

## 2014-01-06 NOTE — Telephone Encounter (Signed)
Also requesting update on antibiotics, still has not got it. Was told at appointment yesterday it would be sent.

## 2014-01-06 NOTE — Telephone Encounter (Signed)
Please advise pt that we sent in some Cipro

## 2014-01-06 NOTE — Telephone Encounter (Signed)
Relevant patient education assigned to patient using Emmi. ° °

## 2014-01-07 NOTE — Telephone Encounter (Signed)
Informed patient of this.  °

## 2014-01-08 ENCOUNTER — Ambulatory Visit: Payer: 59 | Admitting: Family Medicine

## 2014-01-10 DIAGNOSIS — R52 Pain, unspecified: Secondary | ICD-10-CM | POA: Insufficient documentation

## 2014-01-10 NOTE — Assessment & Plan Note (Signed)
Atypical chest pain and left sided back pain. Allowed refills on her pain meds, has consultation with pain management in a couple weeks

## 2014-01-10 NOTE — Assessment & Plan Note (Signed)
Encouraged complete cessation. Discussed need to quit as relates to risk of numerous cancers, cardiac and pulmonary disease as well as neurologic complications. Counseled for greater than 3 minutes 

## 2014-01-27 ENCOUNTER — Ambulatory Visit (HOSPITAL_BASED_OUTPATIENT_CLINIC_OR_DEPARTMENT_OTHER): Payer: 59 | Admitting: Physical Medicine & Rehabilitation

## 2014-01-27 ENCOUNTER — Encounter: Payer: Self-pay | Admitting: Physical Medicine & Rehabilitation

## 2014-01-27 ENCOUNTER — Encounter: Payer: 59 | Attending: Physical Medicine & Rehabilitation

## 2014-01-27 DIAGNOSIS — M961 Postlaminectomy syndrome, not elsewhere classified: Secondary | ICD-10-CM | POA: Insufficient documentation

## 2014-01-27 DIAGNOSIS — G894 Chronic pain syndrome: Secondary | ICD-10-CM | POA: Diagnosis present

## 2014-01-27 DIAGNOSIS — Z5181 Encounter for therapeutic drug level monitoring: Secondary | ICD-10-CM | POA: Insufficient documentation

## 2014-01-27 DIAGNOSIS — M4802 Spinal stenosis, cervical region: Secondary | ICD-10-CM | POA: Insufficient documentation

## 2014-01-27 DIAGNOSIS — Z79899 Other long term (current) drug therapy: Secondary | ICD-10-CM | POA: Insufficient documentation

## 2014-01-27 DIAGNOSIS — M069 Rheumatoid arthritis, unspecified: Secondary | ICD-10-CM

## 2014-01-27 NOTE — Progress Notes (Signed)
Subjective:    Patient ID: Jennifer Kelly, female    DOB: August 25, 1960, 53 y.o.   MRN: [id number]  HPI  Chief complaint chest pain as well as posterior rib pain left side as well as neck pain  No cardiac hx, evaluated by PCP  Secondary to complaints include right shoulder pain as well as intermittent joint pain and swelling from rheumatoid arthritis  53 year old female with history of rheumatoid arthritis referred by primary care physician to evaluate pain.  Patient has been on chronic oxycodone 8-9 tablets per day. She was started on a fentanyl patch about a year ago following a bout of post herpetic neuralgia.  Patient also on and off prednisone Started Orencia 4-5 months ago, (previously on [place])  Was off this around time of surgery   Past surgical history significant for labor old debridement biceps Tenotomy, subacromial decompression and per second he, distal clavicle resection performed 12/02/2013 by Dr. Onnie Graham  Lumbar injections prior to hemilaminotomy L4-5  Neck injection per Dr Ron Agee  Multiple shoulder injections  No suprascapular nerve blocks Pain Inventory Average Pain 7 Pain Right Now 8 My pain is constant, sharp, burning, stabbing, tingling and aching  In the last 24 hours, has pain interfered with the following? General activity 7 Relation with others 7 Enjoyment of life 8 What TIME of day is your pain at its worst? morning and night Sleep (in general) Poor  Pain is worse with: walking, bending, standing and some activites Pain improves with: heat/ice, medication and injections Relief from Meds: 4  Mobility walk without assistance how many minutes can you walk? 20 ability to climb steps?  yes do you drive?  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  Superimposed degenerative changes are noted as follows: T11-12:  Negative.   T12-L1:  Negative.   L1-2:  Negative.   L2-3:  Stable mild circumferential disc bulge. Moderate facet and mild ligament flavum hypertrophy. Overall mild central spinal stenosis and no neural foraminal stenosis, stable.    L3-4:  Stable circumferential disc bulge, moderate facet and mild ligament flavum hypertrophy.  Stable mild central spinal stenosis (AP thecal sac 9.50mm) previously 17mm at a comparable level.  Stable mild right neural foraminal stenosis.    L4-5:  Sequela of left hemilaminectomy.  Stable enhancing granulation tissue in the left anterolateral epidural space.  Stable slight narrowing of the left lateral recess.  Otherwise the spinal canal remains patent.  Stable moderate bilateral neural foraminal stenosis related to disc, moderate facet, and ligament flavum hypertrophy.  No progression since the prior study evident.  Enhancement in the left L4 neural foramina has increased.    L5-S1:  Stable left lateral disc protrusion which appears to abut the exiting left L4 nerve root, unchanged since the prior exam.  Spinal canal remains patent.  Moderate bilateral neural foraminal stenosis is stable and related to disc osteophyte, facet, and ligament flavum changes.    IMPRESSION:   1.  Increased marrow edema and enhancement involving the left L4 and L5 vertebral bodies centered around the endplates and also involving the left L4 nerve foramen.  The appearance may reflect progression of discogenic degenerative changes.  The findings may be a source for left L4 radiculitis despite stable appearance  of moderate bilateral neural foraminal stenosis since the prior exam.   2.  Stable multilevel, multifactorial neural foraminal stenosis at other levels as detailed above.   3.  Stable mild central spinal stenosis at L3-4 and L2-3 due to  congenital stenosis with superimposed disc, facet, and ligament flavum degeneration.   4.  Stable post operative changes at L4-5.   MRI CERVICAL SPINE WITHOUT CONTRAST 08/28/2011   Technique:  Multiplanar and multiecho pulse sequences of the cervical spine, to include the craniocervical junction and cervicothoracic junction, were obtained according to standard protocol without intravenous contrast.   Comparison: None.   Findings: Scan extends from the upper clivus through T1-2. Visualized intracranial contents are normal.  Spinal cord is normal.   C1-2 through C4-5:  Normal.   C5-6:  Moderate uncinate spurring to the left without foraminal stenosis or focal nerve root impingement.   C6-7:  Tiny broad-based disc bulge asymmetric to the left with prominent left uncinate spurring causing moderate left foraminal stenosis.   C7-T1:  Normal.   T1-2:  Tiny central disc bulge without impingement.   IMPRESSION: 1.  No right-sided neural impingement to explain the patient's symptoms. 2.  Moderate left foraminal narrowing at C6-7 due to uncinate spurs.  Physicians involved in your care Primary care [facility]   Family History  Problem Relation Age of Onset  . Parkinson's disease Mother   . Heart disease Mother   . Hypertension Mother   . Diabetes Father     type 2  . Hypertension Father   . Vascular Disease Brother   . Hypertension Brother   . Diabetes Brother    History   Social History  . Marital Status: Married    Spouse Name: N/A    Number of Children: N/A  . Years of Education: N/A   Social History Main Topics  . Smoking status: Current Every Day Smoker  . Smokeless tobacco: None  . Alcohol Use: Yes     Comment: occ wine  . Drug Use: No  . Sexual Activity: None   Other Topics Concern  . None   Social History Narrative  . None   Past Surgical History  Procedure Laterality Date  .  Tooth extraction  05/2012  . Wisdom tooth extraction  53 yrs old  . Back surgery  2003  . Rotater cuff  20 yrs ago    left shoulder   Past Medical History  Diagnosis Date  . Arthritis   . Hypertension   . Shingles 2014  . Chicken pox as a child  . Mumps as a child  . History of chicken pox   . H/O mumps   . Acute bronchitis 01/05/2014   There were no vitals taken for this visit.  Opioid Risk Score: 1 Fall Risk Score: Low Fall Risk (0-5 points) (educated and given handout on fall prevention in the home) Review of Systems  Neurological: Positive for weakness and numbness.  Psychiatric/Behavioral: Positive for dysphoric mood. The patient is nervous/anxious.   All other systems reviewed and are negative.      Objective:   Physical Exam  Nursing note and vitals reviewed. Constitutional: She is oriented to person, place, and time. She appears well-developed and well-nourished.  HENT:  Head: Normocephalic and atraumatic.  Eyes: Conjunctivae and EOM are normal. Pupils are equal, round, and reactive to light.  Neurological: She is alert and oriented to person, place, and time.  Psychiatric: She has a  normal mood and affect.    Lumbar range of motion 25% lateral bending right and left 50% for flexion 25% extension  Tenderness to palpation in the lumbar paraspinal starting at L4-S1. Tenderness to palpation right upper trapezius right infraspinatus area.  Tenderness in the para sternal area to palpation  Limited R shoulder ROM, reduced external rotation reduced abduction Able to touch chin and nose cannot abduct arm to touch top of head Internal rotation right shoulder Ltd. able to touch right hip but not the low back area Left Dupryten's contracture flexor tendon #3 Tenderness first and second M CP and PIP of the right hand No wrist infusion. No evidence of knee effusion 1+ right patellar, 2+ left patellar reflex 1+ bilateral Achilles reflex 3+ bilateral biceps triceps and  brachioradialis reflexes     Assessment & Plan:  1. Chronic pain syndrome multifactorial Lumbar postlaminectomy syndrome Cervical stenosis Rheumatoid arthritis with multiple joint involvement Right shoulder pain postoperative status post SLAP tear repair  Discussed the role of narcotic analgesics. The fact that when use chronically their efficacy is only about 10-30% pain relief We discussed that there is a potential of switching to another agent although this does not guarantee that it's any better and can even be less effective. Would replace Duragesic 50 mcg with MS Contin 60 mg twice a day Would replace Percocet 10 mg with MS IR 15 mg 4 times per day  Discussed other interventional pain procedures such as suprascapular nerve block for the right shoulder pain as well as lumbar epidural or medial branch injections for the back pain.  We discussed that we need to get a urine drug screen before we prescribed any pain medications.

## 2014-01-27 NOTE — Patient Instructions (Signed)
Need to get another prescription pain medications from primary care physician  If urine drug screen checks out consistent, then we'll try morphine long acting plus will morphine short acting in place of Duragesic and oxycodone

## 2014-01-29 ENCOUNTER — Telehealth: Payer: Self-pay | Admitting: Family Medicine

## 2014-01-29 DIAGNOSIS — M069 Rheumatoid arthritis, unspecified: Secondary | ICD-10-CM

## 2014-01-29 NOTE — Telephone Encounter (Signed)
Spoke with Larkin Ina form Dr. Letta Pate office. He confirmed with MD that he will not provide any pain medications until the patients OV on 10/18. Dr. Read Drivers stated that we could provide one more refill on until that date.

## 2014-01-29 NOTE — Telephone Encounter (Signed)
Pt was seen  on 9/8 by pain management dr. Letta Pate, states he informed her that she will need to get her pain meds from dr. Charlett Blake until she goes back to him on 02/26/14. States Dr. Letta Pate does not provide pain meds for a new pt visit. Pt states her pain meds will run out on 02/01/14. Pt states she only needs oxyCODONE-acetaminophen (PERCOCET) 10-325 MG per tablet. Please call when available for pick up. Pt states on 8/17 she was only given a 27 day supply.

## 2014-01-29 NOTE — Telephone Encounter (Signed)
She can have a refill on her Percocet, same strength, same sig, same number

## 2014-01-29 NOTE — Telephone Encounter (Signed)
Verbal per md: we need to get records from Dr Letta Pate office to confirm this information.  I tried to call Corene Cornea answered) but I was on hold for 5 minutes.  Number is [phone]   Please try to call and get records ASAP

## 2014-01-30 MED ORDER — OXYCODONE-ACETAMINOPHEN 10-325 MG PO TABS: 2.0000 | ORAL_TABLET | Freq: Four times a day (QID) | ORAL | Status: DC | PRN

## 2014-01-30 NOTE — Telephone Encounter (Signed)
Anderson Malta informed patient she could pick up after 2 today

## 2014-02-16 ENCOUNTER — Ambulatory Visit: Payer: 59 | Admitting: Family Medicine

## 2014-02-17 ENCOUNTER — Ambulatory Visit (INDEPENDENT_AMBULATORY_CARE_PROVIDER_SITE_OTHER): Payer: 59 | Admitting: Family Medicine

## 2014-02-17 ENCOUNTER — Encounter: Payer: Self-pay | Admitting: Family Medicine

## 2014-02-17 VITALS — BP 135/66 | HR 87 | Temp 98.8°F | Ht 69.0 in | Wt 244.2 lb

## 2014-02-17 DIAGNOSIS — F418 Other specified anxiety disorders: Secondary | ICD-10-CM

## 2014-02-17 DIAGNOSIS — M069 Rheumatoid arthritis, unspecified: Secondary | ICD-10-CM

## 2014-02-17 DIAGNOSIS — R52 Pain, unspecified: Secondary | ICD-10-CM

## 2014-02-17 DIAGNOSIS — E669 Obesity, unspecified: Secondary | ICD-10-CM

## 2014-02-17 DIAGNOSIS — E785 Hyperlipidemia, unspecified: Secondary | ICD-10-CM

## 2014-02-17 DIAGNOSIS — F341 Dysthymic disorder: Secondary | ICD-10-CM

## 2014-02-17 DIAGNOSIS — I10 Essential (primary) hypertension: Secondary | ICD-10-CM

## 2014-02-17 MED ORDER — FENTANYL 75 MCG/HR TD PT72: 75.0000 ug | MEDICATED_PATCH | TRANSDERMAL | Status: DC

## 2014-02-17 MED ORDER — OXYCODONE-ACETAMINOPHEN 10-325 MG PO TABS: 2.0000 | ORAL_TABLET | Freq: Four times a day (QID) | ORAL | Status: DC | PRN

## 2014-02-17 NOTE — Progress Notes (Signed)
Pre visit review using our clinic review tool, if applicable. No additional management support is needed unless otherwise documented below in the visit note. 

## 2014-02-17 NOTE — Progress Notes (Signed)
Patient ID: DENESHIA ZUCKER, female   DOB: [birth year], 53 y.o.   MRN: [id number] BRYONY KAMAN [id number] [birth year] 02/17/2014      Progress Note-Follow Up  Subjective  Chief Complaint  Chief Complaint  Patient presents with  . Follow-up    6 follow up    HPI  Patient is a 53 year old female in today for routine medical care. Has long term history of pain and narcotic use. Has had a consultation with pain management as we asked and did not have a good experience. She was started on Fentanyl 50 mcg by her previous PMD and just after starting she reports her pain was better controlled than it has been in years. She is hoping we will help with her pain meds at this time. She has been struggling with an RA flare lately and has had increased shoulder and sternal pain. Has been seen by her rheumatologist and has improved slightly with steroids, did have one old Hydrocodone and used that to see if it would help but it did not. She has also just lost her 39 year old Paternal uncle who helped raise her so she is sad and stressed. No other recent illness. Denies CP/palp/SOB/HA/congestion/fevers/GI or GU c/o. Taking meds as prescribed Past Medical History  Diagnosis Date  . Arthritis   . Hypertension   . Shingles 2014  . Chicken pox as a child  . Mumps as a child  . History of chicken pox   . H/O mumps   . Acute bronchitis 01/05/2014    Past Surgical History  Procedure Laterality Date  . Tooth extraction  05/2012  . Wisdom tooth extraction  53 yrs old  . Back surgery  2003  . Rotater cuff  20 yrs ago    left shoulder    Family History  Problem Relation Age of Onset  . Parkinson's disease Mother   . Heart disease Mother   . Hypertension Mother   . Diabetes Father     type 2  . Hypertension Father   . Vascular Disease Brother   . Hypertension Brother   . Diabetes Brother   . Heart attack Paternal Uncle     History   Social History  . Marital Status: Married    Spouse Name: N/A     Number of Children: N/A  . Years of Education: N/A   Occupational History  . Not on file.   Social History Main Topics  . Smoking status: Current Every Day Smoker  . Smokeless tobacco: Not on file  . Alcohol Use: Yes     Comment: occ wine  . Drug Use: No  . Sexual Activity: Not on file   Other Topics Concern  . Not on file   Social History Narrative  . No narrative on file    Current Outpatient Prescriptions on File Prior to Visit  Medication Sig Dispense Refill  . abatacept (ORENCIA) 250 MG injection Inject into the vein once a week.      . ciprofloxacin (CIPRO) 500 MG tablet Take 1 tablet (500 mg total) by mouth 2 (two) times daily. X 5 days  10 tablet  0  . citalopram (CELEXA) 20 MG tablet take 1 tablet by mouth once daily  30 tablet  10  . diltiazem (CARDIZEM CD) 240 MG 24 hr capsule Take 1 capsule (240 mg total) by mouth daily.  30 capsule  11  . fentaNYL (DURAGESIC) 50 MCG/HR Place 1 patch (50 mcg total) onto  the skin every 3 (three) days. May fill on 01/13/2014  10 patch  0  . gabapentin (NEURONTIN) 600 MG tablet Take 1 tablet (600 mg total) by mouth 3 (three) times daily.  90 tablet  4  . ibuprofen (ADVIL,MOTRIN) 800 MG tablet       . losartan-hydrochlorothiazide (HYZAAR) 50-12.5 MG per tablet Take 1 tablet by mouth daily.  30 tablet  11  . oxyCODONE-acetaminophen (PERCOCET) 10-325 MG per tablet Take 2 tablets by mouth every 6 (six) hours as needed for pain.  220 tablet  0  . predniSONE (DELTASONE) 5 MG tablet       . VENTOLIN HFA 108 (90 BASE) MCG/ACT inhaler inhale 2 puffs by mouth every 6 hours if needed  18 g  4   No current facility-administered medications on file prior to visit.    No Known Allergies  Review of Systems  Review of Systems  Constitutional: Negative for fever and malaise/fatigue.  HENT: Negative for congestion.   Eyes: Negative for discharge.  Respiratory: Negative for shortness of breath.   Cardiovascular: Negative for chest pain,  palpitations and leg swelling.  Gastrointestinal: Negative for nausea, abdominal pain and diarrhea.  Genitourinary: Negative for dysuria.  Musculoskeletal: Positive for back pain, joint pain and myalgias. Negative for falls.  Skin: Negative for rash.  Neurological: Negative for loss of consciousness and headaches.  Endo/Heme/Allergies: Negative for polydipsia.  Psychiatric/Behavioral: Positive for depression. Negative for suicidal ideas. The patient is not nervous/anxious and does not have insomnia.     Objective  BP 135/66  Pulse 87  Temp(Src) 98.8 F (37.1 C) (Oral)  Ht 5\' 9"  (1.753 m)  Wt 244 lb 3.2 oz (110.768 kg)  BMI 36.05 kg/m2  SpO2 97%  Physical Exam  Physical Exam  Constitutional: She is oriented to person, place, and time and well-developed, well-nourished, and in no distress. No distress.  HENT:  Head: Normocephalic and atraumatic.  Eyes: Conjunctivae are normal.  Neck: Neck supple. No thyromegaly present.  Cardiovascular: Normal rate, regular rhythm and normal heart sounds.   No murmur heard. Pulmonary/Chest: Effort normal and breath sounds normal. She has no wheezes.  Abdominal: She exhibits no distension and no mass.  Musculoskeletal: She exhibits no edema.  Lymphadenopathy:    She has no cervical adenopathy.  Neurological: She is alert and oriented to person, place, and time.  Skin: Skin is warm and dry. No rash noted. She is not diaphoretic.  Psychiatric: Memory, affect and judgment normal.    Lab Results  Component Value Date   TSH 0.51 10/08/2009   No results found for this basename: ALT, AST, GGT, ALKPHOS, BILITOT   Lab Results  Component Value Date   CHOL 179 10/08/2009   Lab Results  Component Value Date   HDL 57.70 10/08/2009   Lab Results  Component Value Date   LDLCALC 90 10/08/2009   Lab Results  Component Value Date   TRIG 157.0* 10/08/2009   Lab Results  Component Value Date   CHOLHDL 3 10/08/2009     Assessment &  Plan  Hypertension Well controlled, no changes to meds. Encouraged heart healthy diet such as the DASH diet and exercise as tolerated.   OBESITY, CLASS II Encouraged DASH diet, decrease po intake and increase exercise as tolerated. Needs 7-8 hours of sleep nightly. Avoid trans fats, eat small, frequent meals every 4-5 hours with lean proteins, complex carbs and healthy fats. Minimize simple carbs, GMO foods.  Pain Has long term history of pain  and narcotic use. Has had a consultation with pain management as we asked and did not have a good experience. I am willing to try and titrate her meds, she understands she will have to undergo routine drug testing and stay within the parameters of her prescriptions, has signed a drug contract. She was started on Fentanyl at 50 mcg by her previous PMD and has never had it titrated, we will try increasing to 75 mcg every 3 days and will drop her Percocet down to 180 tabs monthly. She agrees and understands that our ultimate goal is to decrease the Percocet use and control her pain adequately not completely.  Depression with anxiety Has just suffered through the loss of her paternal uncle who raised her and is on the way to the funeral. Feels she is doing Ok despite the stressors.

## 2014-02-18 ENCOUNTER — Other Ambulatory Visit (INDEPENDENT_AMBULATORY_CARE_PROVIDER_SITE_OTHER): Payer: 59

## 2014-02-18 ENCOUNTER — Telehealth: Payer: Self-pay | Admitting: *Deleted

## 2014-02-18 ENCOUNTER — Other Ambulatory Visit: Payer: Self-pay | Admitting: Family Medicine

## 2014-02-18 ENCOUNTER — Telehealth: Payer: Self-pay | Admitting: Family Medicine

## 2014-02-18 DIAGNOSIS — D72829 Elevated white blood cell count, unspecified: Secondary | ICD-10-CM

## 2014-02-18 LAB — CBC WITH DIFFERENTIAL/PLATELET
BASOS ABS: 0 10*3/uL (ref 0.0–0.1)
Basophils Relative: 0.2 % (ref 0.0–3.0)
Eosinophils Absolute: 0 10*3/uL (ref 0.0–0.7)
Eosinophils Relative: 0.2 % (ref 0.0–5.0)
HCT: 46 % (ref 36.0–46.0)
Hemoglobin: 15.3 g/dL — ABNORMAL HIGH (ref 12.0–15.0)
Lymphocytes Relative: 9 % — ABNORMAL LOW (ref 12.0–46.0)
Lymphs Abs: 1.7 10*3/uL (ref 0.7–4.0)
MCHC: 33.2 g/dL (ref 30.0–36.0)
MCV: 88.9 fl (ref 78.0–100.0)
MONO ABS: 1.1 10*3/uL — AB (ref 0.1–1.0)
MONOS PCT: 5.8 % (ref 3.0–12.0)
NEUTROS PCT: 84.8 % — AB (ref 43.0–77.0)
Neutro Abs: 15.7 10*3/uL — ABNORMAL HIGH (ref 1.4–7.7)
PLATELETS: 297 10*3/uL (ref 150.0–400.0)
RBC: 5.18 Mil/uL — ABNORMAL HIGH (ref 3.87–5.11)
RDW: 14.4 % (ref 11.5–15.5)
WBC: 18.5 10*3/uL (ref 4.0–10.5)

## 2014-02-18 LAB — RENAL FUNCTION PANEL
Albumin: 3.9 g/dL (ref 3.5–5.2)
BUN: 13 mg/dL (ref 6–23)
CO2: 24 mEq/L (ref 19–32)
CREATININE: 0.8 mg/dL (ref 0.4–1.2)
Calcium: 9.1 mg/dL (ref 8.4–10.5)
Chloride: 97 mEq/L (ref 96–112)
GFR: 84.16 mL/min (ref >60.00)
GLUCOSE: 109 mg/dL — AB (ref 70–99)
Phosphorus: 3.6 mg/dL (ref 2.3–4.6)
Potassium: 3.9 mEq/L (ref 3.5–5.1)
SODIUM: 133 meq/L — AB (ref 135–145)

## 2014-02-18 LAB — HEPATIC FUNCTION PANEL
ALBUMIN: 3.9 g/dL (ref 3.5–5.2)
ALK PHOS: 95 U/L (ref 39–117)
ALT: 50 U/L — AB (ref 0–35)
AST: 29 U/L (ref 0–37)
Bilirubin, Direct: 0.1 mg/dL (ref 0.0–0.3)
TOTAL PROTEIN: 8.3 g/dL (ref 6.0–8.3)
Total Bilirubin: 0.7 mg/dL (ref 0.2–1.2)

## 2014-02-18 LAB — CBC
HCT: 45.4 % (ref 36.0–46.0)
Hemoglobin: 15.3 g/dL — ABNORMAL HIGH (ref 12.0–15.0)
MCHC: 33.8 g/dL (ref 30.0–36.0)
MCV: 87.9 fl (ref 78.0–100.0)
Platelets: 288 10*3/uL (ref 150.0–400.0)
RBC: 5.17 Mil/uL — AB (ref 3.87–5.11)
RDW: 14.6 % (ref 11.5–15.5)
WBC: 18.5 10*3/uL (ref 4.0–10.5)

## 2014-02-18 LAB — LIPID PANEL
CHOLESTEROL: 180 mg/dL (ref 0–200)
HDL: 78.6 mg/dL (ref >39.00)
LDL CALC: 79 mg/dL (ref 0–99)
NonHDL: 101.4
TRIGLYCERIDES: 112 mg/dL (ref 0.0–149.0)
Total CHOL/HDL Ratio: 2
VLDL: 22.4 mg/dL (ref 0.0–40.0)

## 2014-02-18 LAB — TSH: TSH: 0.22 u[IU]/mL — ABNORMAL LOW (ref 0.35–4.50)

## 2014-02-18 NOTE — Assessment & Plan Note (Signed)
Has long term history of pain and narcotic use. Has had a consultation with pain management as we asked and did not have a good experience. I am willing to try and titrate her meds, she understands she will have to undergo routine drug testing and stay within the parameters of her prescriptions, has signed a drug contract. She was started on Fentanyl at 50 mcg by her previous PMD and has never had it titrated, we will try increasing to 75 mcg every 3 days and will drop her Percocet down to 180 tabs monthly. She agrees and understands that our ultimate goal is to decrease the Percocet use and control her pain adequately not completely.

## 2014-02-18 NOTE — Assessment & Plan Note (Signed)
Well controlled, no changes to meds. Encouraged heart healthy diet such as the DASH diet and exercise as tolerated.  °

## 2014-02-18 NOTE — Telephone Encounter (Signed)
emmi emailed °

## 2014-02-18 NOTE — Assessment & Plan Note (Signed)
Encouraged DASH diet, decrease po intake and increase exercise as tolerated. Needs 7-8 hours of sleep nightly. Avoid trans fats, eat small, frequent meals every 4-5 hours with lean proteins, complex carbs and healthy fats. Minimize simple carbs, GMO foods. 

## 2014-02-18 NOTE — Telephone Encounter (Signed)
Received Critical lab results for patient CBC: WBC 18.5 [HH]; called patient and informed her of results and assessed for any symptoms of possible infection, asymptomatic, discussed measures of infection control while she is out of town. Placed add-on lab for differential w/CBC per provider, Neutrophils resulted High at 84.8; left message on pt home phone per patient request; as she is out of town to attend family funeral at this time and will not return until later this weekend. Pt is on Orencia weekly injections w/Rheumatologist [Dr. Ouida Sills w/[facility]]; pt informed to Hold on injections until further notice from provider. Patient scheduled for follow-up visit & lab recheck on Monday, 10.05.15 at 5:45 p with Jennifer Kelly per provider verbal order/SLS

## 2014-02-18 NOTE — Assessment & Plan Note (Signed)
Has just suffered through the loss of her paternal uncle who raised her and is on the way to the funeral. Feels she is doing Ok despite the stressors.

## 2014-02-19 ENCOUNTER — Other Ambulatory Visit (INDEPENDENT_AMBULATORY_CARE_PROVIDER_SITE_OTHER): Payer: 59

## 2014-02-19 DIAGNOSIS — R946 Abnormal results of thyroid function studies: Secondary | ICD-10-CM

## 2014-02-19 DIAGNOSIS — R7989 Other specified abnormal findings of blood chemistry: Secondary | ICD-10-CM

## 2014-02-19 LAB — T4, FREE: FREE T4: 1.52 ng/dL (ref 0.60–1.60)

## 2014-02-23 ENCOUNTER — Encounter: Payer: Self-pay | Admitting: Family

## 2014-02-23 ENCOUNTER — Ambulatory Visit (INDEPENDENT_AMBULATORY_CARE_PROVIDER_SITE_OTHER): Payer: 59 | Admitting: Family

## 2014-02-23 VITALS — BP 128/78 | HR 81 | Temp 98.3°F | Resp 18 | Ht 69.0 in | Wt 243.6 lb

## 2014-02-23 DIAGNOSIS — D72829 Elevated white blood cell count, unspecified: Secondary | ICD-10-CM

## 2014-02-23 NOTE — Telephone Encounter (Signed)
Lab results were faxed to Dr. Ouida Sills at [facility]/Rheumatology [phone] 10.02.15

## 2014-02-23 NOTE — Patient Instructions (Signed)
Please complete lab work prior to leaving.   

## 2014-02-23 NOTE — Progress Notes (Signed)
Subjective:    Patient ID: Jennifer Kelly, female    DOB: June 09, 1960, 53 y.o.   MRN: [id number]  HPI  Pt here for follow up of elevated WBC and neutrophils. She was noted to elevated WBC of 18.5  Pt recently received 2 steroid injections- one from rheumatology and one from her orthopedic surgeon both were within the last 2-3 weeks per patient.  In addition, she is also on prednisone 5mg  po bid.    Denies dysuria or frequency, fevers, or cough.  Reports recent flare of her RA and now having tenderness inher sternum and right shoulder.     Review of Systems See HPI  Past Medical History  Diagnosis Date  . Arthritis   . Hypertension   . Shingles 2014  . Chicken pox as a child  . Mumps as a child  . History of chicken pox   . H/O mumps   . Acute bronchitis 01/05/2014    History   Social History  . Marital Status: Married    Spouse Name: N/A    Number of Children: N/A  . Years of Education: N/A   Occupational History  . Not on file.   Social History Main Topics  . Smoking status: Current Every Day Smoker  . Smokeless tobacco: Not on file  . Alcohol Use: Yes     Comment: occ wine  . Drug Use: No  . Sexual Activity: Not on file   Other Topics Concern  . Not on file   Social History Narrative  . No narrative on file    Past Surgical History  Procedure Laterality Date  . Tooth extraction  05/2012  . Wisdom tooth extraction  53 yrs old  . Back surgery  2003  . Rotater cuff  20 yrs ago    left shoulder    Family History  Problem Relation Age of Onset  . Parkinson's disease Mother   . Heart disease Mother   . Hypertension Mother   . Diabetes Father     type 2  . Hypertension Father   . Vascular Disease Brother   . Hypertension Brother   . Diabetes Brother   . Heart attack Paternal Uncle     No Known Allergies  Current Outpatient Prescriptions on File Prior to Visit  Medication Sig Dispense Refill  . abatacept (ORENCIA) 250 MG injection Inject into  the vein once a week.      . ciprofloxacin (CIPRO) 500 MG tablet Take 1 tablet (500 mg total) by mouth 2 (two) times daily. X 5 days  10 tablet  0  . citalopram (CELEXA) 20 MG tablet take 1 tablet by mouth once daily  30 tablet  10  . diltiazem (CARDIZEM CD) 240 MG 24 hr capsule Take 1 capsule (240 mg total) by mouth daily.  30 capsule  11  . fentaNYL (DURAGESIC) 75 MCG/HR Place 1 patch (75 mcg total) onto the skin every 3 (three) days.  10 patch  0  . gabapentin (NEURONTIN) 600 MG tablet Take 1 tablet (600 mg total) by mouth 3 (three) times daily.  90 tablet  4  . ibuprofen (ADVIL,MOTRIN) 800 MG tablet       . losartan-hydrochlorothiazide (HYZAAR) 50-12.5 MG per tablet Take 1 tablet by mouth daily.  30 tablet  11  . oxyCODONE-acetaminophen (PERCOCET) 10-325 MG per tablet Take 2 tablets by mouth every 6 (six) hours as needed for pain.  180 tablet  0  . predniSONE (DELTASONE) 5 MG  tablet Take 10 mg by mouth daily.       . VENTOLIN HFA 108 (90 BASE) MCG/ACT inhaler inhale 2 puffs by mouth every 6 hours if needed  18 g  4   No current facility-administered medications on file prior to visit.    BP 128/78  Pulse 81  Temp(Src) 98.3 F (36.8 C) (Oral)  Resp 18  Ht 5\' 9"  (1.753 m)  Wt 243 lb 9.6 oz (110.496 kg)  BMI 35.96 kg/m2  SpO2 96%       Objective:   Physical Exam  Constitutional: She is oriented to person, place, and time. She appears well-developed and well-nourished. No distress.  HENT:  Head: Normocephalic and atraumatic.  Cardiovascular: Normal rate and regular rhythm.   No murmur heard. Pulmonary/Chest: Effort normal and breath sounds normal. No respiratory distress. She has no wheezes. She has no rales. She exhibits no tenderness.  Abdominal: Soft. She exhibits no distension.  Musculoskeletal: She exhibits no edema.  Lymphadenopathy:    She has no cervical adenopathy.  Neurological: She is alert and oriented to person, place, and time.  Psychiatric: She has a normal  mood and affect. Her behavior is normal. Thought content normal.          Assessment & Plan:

## 2014-02-23 NOTE — Progress Notes (Signed)
Pre visit review using our clinic review tool, if applicable. No additional management support is needed unless otherwise documented below in the visit note. 

## 2014-02-24 ENCOUNTER — Encounter: Payer: Self-pay | Admitting: Family

## 2014-02-24 DIAGNOSIS — D72829 Elevated white blood cell count, unspecified: Secondary | ICD-10-CM | POA: Insufficient documentation

## 2014-02-24 LAB — CBC WITH DIFFERENTIAL/PLATELET
Basophils Absolute: 0.2 10*3/uL — ABNORMAL HIGH (ref 0.0–0.1)
Basophils Relative: 1.4 % (ref 0.0–3.0)
EOS PCT: 0.2 % (ref 0.0–5.0)
Eosinophils Absolute: 0 10*3/uL (ref 0.0–0.7)
HCT: 43 % (ref 36.0–46.0)
Hemoglobin: 14.4 g/dL (ref 12.0–15.0)
Lymphocytes Relative: 14.2 % (ref 12.0–46.0)
Lymphs Abs: 1.7 10*3/uL (ref 0.7–4.0)
MCHC: 33.5 g/dL (ref 30.0–36.0)
MCV: 88 fl (ref 78.0–100.0)
MONO ABS: 0.5 10*3/uL (ref 0.1–1.0)
Monocytes Relative: 3.9 % (ref 3.0–12.0)
Neutro Abs: 9.8 10*3/uL — ABNORMAL HIGH (ref 1.4–7.7)
Neutrophils Relative %: 80.3 % — ABNORMAL HIGH (ref 43.0–77.0)
PLATELETS: 235 10*3/uL (ref 150.0–400.0)
RBC: 4.89 Mil/uL (ref 3.87–5.11)
RDW: 14.8 % (ref 11.5–15.5)
WBC: 12.3 10*3/uL — AB (ref 4.0–10.5)

## 2014-02-24 NOTE — Assessment & Plan Note (Signed)
Pt has no clinical signs of infection.  I think that most likely her leukocytosis is secondary to steroids.  Will obtain follow up CBC to monitor.  She is instructed to call if she develops any signs of infection: i.e. fever, dysuria, etc.

## 2014-02-26 ENCOUNTER — Ambulatory Visit: Payer: 59 | Admitting: Physical Medicine & Rehabilitation

## 2014-03-10 ENCOUNTER — Other Ambulatory Visit: Payer: Self-pay

## 2014-03-10 ENCOUNTER — Ambulatory Visit: Payer: 59 | Admitting: Family Medicine

## 2014-03-10 DIAGNOSIS — Z1231 Encounter for screening mammogram for malignant neoplasm of breast: Secondary | ICD-10-CM

## 2014-03-11 ENCOUNTER — Ambulatory Visit (INDEPENDENT_AMBULATORY_CARE_PROVIDER_SITE_OTHER): Payer: 59 | Admitting: Family Medicine

## 2014-03-11 ENCOUNTER — Encounter: Payer: Self-pay | Admitting: Family Medicine

## 2014-03-11 VITALS — BP 125/65 | HR 79 | Temp 98.3°F | Ht 72.0 in | Wt 248.0 lb

## 2014-03-11 DIAGNOSIS — Z720 Tobacco use: Secondary | ICD-10-CM

## 2014-03-11 DIAGNOSIS — Z1211 Encounter for screening for malignant neoplasm of colon: Secondary | ICD-10-CM

## 2014-03-11 DIAGNOSIS — R52 Pain, unspecified: Secondary | ICD-10-CM

## 2014-03-11 DIAGNOSIS — M545 Low back pain: Secondary | ICD-10-CM

## 2014-03-11 DIAGNOSIS — E669 Obesity, unspecified: Secondary | ICD-10-CM

## 2014-03-11 DIAGNOSIS — I10 Essential (primary) hypertension: Secondary | ICD-10-CM

## 2014-03-11 DIAGNOSIS — F17200 Nicotine dependence, unspecified, uncomplicated: Secondary | ICD-10-CM

## 2014-03-11 DIAGNOSIS — M069 Rheumatoid arthritis, unspecified: Secondary | ICD-10-CM

## 2014-03-11 MED ORDER — OXYCODONE-ACETAMINOPHEN 10-325 MG PO TABS: 2.0000 | ORAL_TABLET | Freq: Four times a day (QID) | ORAL | Status: DC | PRN

## 2014-03-11 MED ORDER — DURAGESIC-100 100 MCG/HR TD PT72: 100.0000 ug | MEDICATED_PATCH | TRANSDERMAL | Status: DC

## 2014-03-11 NOTE — Progress Notes (Signed)
Pre visit review using our clinic review tool, if applicable. No additional management support is needed unless otherwise documented below in the visit note. 

## 2014-03-11 NOTE — Patient Instructions (Signed)
Rheumatoid Arthritis °Rheumatoid arthritis is a long-term (chronic) inflammatory disease that causes pain, swelling, and stiffness of the joints. It can affect the entire body, including the eyes and lungs. The effects of rheumatoid arthritis vary widely among those with the condition. °CAUSES  °The cause of rheumatoid arthritis is not known. It tends to run in families and is more common in women. Certain cells of the body's natural defense system (immune system) do not work properly and begin to attack healthy joints. It primarily involves the connective tissue that lines the joints (synovial membrane). This can cause damage to the joint. °SYMPTOMS  °· Pain, stiffness, swelling, and decreased motion of many joints, especially in the hands and feet. °· Stiffness that is worse in the morning. It may last 1-2 hours or longer. °· Numbness and tingling in the hands. °· Fatigue. °· Loss of appetite. °· Weight loss. °· Low-grade fever. °· Dry eyes and mouth. °· Firm lumps (rheumatoid nodules) that grow beneath the skin in areas such as the elbows and hands. °DIAGNOSIS  °Diagnosis is based on the symptoms described, an exam, and blood tests. Sometimes, X-rays are helpful. °TREATMENT  °The goals of treatment are to relieve pain, reduce inflammation, and to slow down or stop joint damage and disability. Methods vary and may include: °· Maintaining a balance of rest, exercise, and proper nutrition. °· Medicines: °¨ Pain relievers (analgesics). °¨ Corticosteroids and nonsteroidal anti-inflammatory drugs (NSAIDs) to reduce inflammation. °¨ Disease-modifying antirheumatic drugs (DMARDs) to try to slow the course of the disease. °¨ Biologic response modifiers to reduce inflammation and damage. °· Physical therapy and occupational therapy. °· Surgery for patients with severe joint damage. Joint replacement or fusing of joints may be needed. °· Routine monitoring and ongoing care, such as office visits, blood and urine tests, and  X-rays. °HOME CARE INSTRUCTIONS  °· Remain physically active and reduce activity when the disease gets worse. °· Eat a well-balanced diet. °· Put heat on affected joints when you wake up and before activities. Keep the heat on the affected joint for as long as directed by your health care provider. °· Put ice on affected joints following activities or exercising. °¨ Put ice in a plastic bag. °¨ Place a towel between your skin and the bag. °¨ Leave the ice on for 15-20 minutes, 3-4 times per day, or as directed by your health care provider. °· Take medicines and supplements only as directed by your health care provider. °· Use splints as directed by your health care provider. Splints help maintain joint position and function. °· Do not sleep with pillows under your knees. This may lead to spasms. °· Participate in a self-management program to keep current with the latest treatment and coping skills. °SEEK IMMEDIATE MEDICAL CARE IF: °· You have fainting episodes. °· You have periods of extreme weakness. °· You rapidly develop a hot, painful joint that is more severe than usual joint aches. °· You have chills. °· You have a fever. °FOR MORE INFORMATION  °· [employer] Rheumatology: www.rheumatology.org °· Arthritis Foundation: www.arthritis.org °Document Released: 05/05/2000 Document Revised: 09/22/2013 Document Reviewed: 06/14/2011 °Exit[employer]® Patient Information ©2015 [employer]. This information is not intended to replace advice given to you by your health care provider. Make sure you discuss any questions you have with your health care provider. ° °

## 2014-03-15 DIAGNOSIS — Z1211 Encounter for screening for malignant neoplasm of colon: Secondary | ICD-10-CM | POA: Insufficient documentation

## 2014-03-15 NOTE — Assessment & Plan Note (Signed)
Tolerated Fentanyl but still with significant pain will increase Fentanyl to 100 and reassess next month. Agrees to minimize Percocet use

## 2014-03-15 NOTE — Assessment & Plan Note (Signed)
Referred for colonoscopy

## 2014-03-15 NOTE — Assessment & Plan Note (Signed)
Well controlled, no changes to meds. Encouraged heart healthy diet such as the DASH diet and exercise as tolerated.  °

## 2014-03-15 NOTE — Assessment & Plan Note (Signed)
Tolerating Orencia

## 2014-03-15 NOTE — Progress Notes (Signed)
Patient ID: Jennifer Kelly, female   DOB: Jun 29, 1960, 53 y.o.   MRN: [id number] Jennifer Kelly [id number] [birth year] 03/15/2014      Progress Note-Follow Up  Subjective  Chief Complaint  Chief Complaint  Patient presents with  . Follow-up    3 week    HPI  Patient is a 53 year old female in today for routine medical care. In today with persist pain in back and shoulder. Tolerated Fentanyl but still needed significant breakthrough meds. Has had a rheumatology flare with sternal chest pain, shoulder improving. Has had some trouble with left eye is following with opthamology and is using Prednisolone drops. Is following with rheumatology Jennifer Kelly at [facility]. No recent illness otherwise. Denies/palp/SOB/HA/congestion/fevers/GI or GU c/o. Taking meds as prescribed  Past Medical History  Diagnosis Date  . Arthritis   . Hypertension   . Shingles 2014  . Chicken pox as a child  . Mumps as a child  . History of chicken pox   . H/O mumps   . Acute bronchitis 01/05/2014    Past Surgical History  Procedure Laterality Date  . Tooth extraction  05/2012  . Wisdom tooth extraction  53 yrs old  . Back surgery  2003  . Rotater cuff  20 yrs ago    left shoulder    Family History  Problem Relation Age of Onset  . Parkinson's disease Mother   . Heart disease Mother   . Hypertension Mother   . Diabetes Father     type 2  . Hypertension Father   . Vascular Disease Brother   . Hypertension Brother   . Diabetes Brother   . Heart attack Paternal Uncle     History   Social History  . Marital Status: Married    Spouse Name: N/A    Number of Children: N/A  . Years of Education: N/A   Occupational History  . Not on file.   Social History Main Topics  . Smoking status: Current Every Day Smoker  . Smokeless tobacco: Not on file  . Alcohol Use: Yes     Comment: occ wine  . Drug Use: No  . Sexual Activity: Not on file   Other Topics Concern  . Not on file   Social History  Narrative  . No narrative on file    Current Outpatient Prescriptions on File Prior to Visit  Medication Sig Dispense Refill  . abatacept (ORENCIA) 250 MG injection Inject into the vein once a week.      . citalopram (CELEXA) 20 MG tablet take 1 tablet by mouth once daily  30 tablet  10  . diltiazem (CARDIZEM CD) 240 MG 24 hr capsule Take 1 capsule (240 mg total) by mouth daily.  30 capsule  11  . fentaNYL (DURAGESIC) 75 MCG/HR Place 1 patch (75 mcg total) onto the skin every 3 (three) days.  10 patch  0  . gabapentin (NEURONTIN) 600 MG tablet Take 1 tablet (600 mg total) by mouth 3 (three) times daily.  90 tablet  4  . ibuprofen (ADVIL,MOTRIN) 800 MG tablet       . losartan-hydrochlorothiazide (HYZAAR) 50-12.5 MG per tablet Take 1 tablet by mouth daily.  30 tablet  11  . predniSONE (DELTASONE) 5 MG tablet Take 10 mg by mouth daily.       . VENTOLIN HFA 108 (90 BASE) MCG/ACT inhaler inhale 2 puffs by mouth every 6 hours if needed  18 g  4   No  current facility-administered medications on file prior to visit.    No Known Allergies  Review of Systems  Review of Systems  Constitutional: Negative for fever and malaise/fatigue.  HENT: Negative for congestion.   Eyes: Negative for discharge.  Respiratory: Negative for shortness of breath.   Cardiovascular: Negative for chest pain, palpitations and leg swelling.  Gastrointestinal: Negative for nausea, abdominal pain and diarrhea.  Genitourinary: Negative for dysuria.  Musculoskeletal: Positive for back pain and joint pain. Negative for falls.       Right shoulder pain improving but still present. Back pain persists  Skin: Negative for rash.  Neurological: Negative for loss of consciousness and headaches.  Endo/Heme/Allergies: Negative for polydipsia.  Psychiatric/Behavioral: Negative for depression and suicidal ideas. The patient is not nervous/anxious and does not have insomnia.     Objective  BP 125/65  Pulse 79  Temp(Src) 98.3  F (36.8 C) (Oral)  Ht 6' (1.829 m)  Wt 248 lb (112.492 kg)  BMI 33.63 kg/m2  SpO2 98%  Physical Exam  Physical Exam  Constitutional: She is oriented to person, place, and time and well-developed, well-nourished, and in no distress. No distress.  HENT:  Head: Normocephalic and atraumatic.  Eyes: Conjunctivae are normal.  Neck: Neck supple. No thyromegaly present.  Cardiovascular: Normal rate, regular rhythm and normal heart sounds.   No murmur heard. Pulmonary/Chest: Effort normal and breath sounds normal. She has no wheezes.  Abdominal: She exhibits no distension and no mass.  Musculoskeletal: She exhibits no edema.  Lymphadenopathy:    She has no cervical adenopathy.  Neurological: She is alert and oriented to person, place, and time.  Skin: Skin is warm and dry. No rash noted. She is not diaphoretic.  Psychiatric: Memory, affect and judgment normal.    Lab Results  Component Value Date   TSH 0.22* 02/17/2014   Lab Results  Component Value Date   WBC 12.3* 02/23/2014   HGB 14.4 02/23/2014   HCT 43.0 02/23/2014   MCV 88.0 02/23/2014   PLT 235.0 02/23/2014   Lab Results  Component Value Date   CREATININE 0.8 02/17/2014   BUN 13 02/17/2014   NA 133* 02/17/2014   K 3.9 02/17/2014   CL 97 02/17/2014   CO2 24 02/17/2014   Lab Results  Component Value Date   ALT 50* 02/17/2014   AST 29 02/17/2014   ALKPHOS 95 02/17/2014   BILITOT 0.7 02/17/2014   Lab Results  Component Value Date   CHOL 180 02/17/2014   Lab Results  Component Value Date   HDL 78.60 02/17/2014   Lab Results  Component Value Date   LDLCALC 79 02/17/2014   Lab Results  Component Value Date   TRIG 112.0 02/17/2014   Lab Results  Component Value Date   CHOLHDL 2 02/17/2014     Assessment & Plan  Hypertension Well controlled, no changes to meds. Encouraged heart healthy diet such as the DASH diet and exercise as tolerated.   Obesity Encouraged DASH diet, decrease po intake and increase exercise as  tolerated. Needs 7-8 hours of sleep nightly. Avoid trans fats, eat small, frequent meals every 4-5 hours with lean proteins, complex carbs and healthy fats. Minimize simple carbs, GMO foods.  Rheumatoid arthritis Tolerating Orencia  LOW BACK PAIN Tolerated Fentanyl but still with significant pain will increase Fentanyl to 100 and reassess next month. Agrees to minimize Percocet use  TOBACCO ABUSE Has been decreasing cigarettes agrees to continue reducing  Screening for colon cancer Referred for  colonoscopy

## 2014-03-15 NOTE — Assessment & Plan Note (Signed)
Has been decreasing cigarettes agrees to continue reducing

## 2014-03-15 NOTE — Assessment & Plan Note (Signed)
Encouraged DASH diet, decrease po intake and increase exercise as tolerated. Needs 7-8 hours of sleep nightly. Avoid trans fats, eat small, frequent meals every 4-5 hours with lean proteins, complex carbs and healthy fats. Minimize simple carbs, GMO foods. 

## 2014-03-25 ENCOUNTER — Other Ambulatory Visit: Payer: Self-pay | Admitting: Family Medicine

## 2014-03-26 ENCOUNTER — Ambulatory Visit: Payer: 59

## 2014-04-01 ENCOUNTER — Ambulatory Visit (HOSPITAL_BASED_OUTPATIENT_CLINIC_OR_DEPARTMENT_OTHER)
Admission: RE | Admit: 2014-04-01 | Discharge: 2014-04-01 | Disposition: A | Discharge status: Home / Self Care | Payer: 59 | Source: Ambulatory Visit | Attending: Medical | Admitting: Medical

## 2014-04-01 ENCOUNTER — Ambulatory Visit (INDEPENDENT_AMBULATORY_CARE_PROVIDER_SITE_OTHER): Payer: 59 | Admitting: Medical

## 2014-04-01 ENCOUNTER — Encounter: Payer: Self-pay | Admitting: Medical

## 2014-04-01 VITALS — BP 120/77 | HR 73 | Temp 98.4°F | Ht 72.0 in | Wt 250.6 lb

## 2014-04-01 DIAGNOSIS — R062 Wheezing: Secondary | ICD-10-CM

## 2014-04-01 DIAGNOSIS — R059 Cough, unspecified: Secondary | ICD-10-CM

## 2014-04-01 DIAGNOSIS — R05 Cough: Secondary | ICD-10-CM | POA: Diagnosis present

## 2014-04-01 DIAGNOSIS — J208 Acute bronchitis due to other specified organisms: Secondary | ICD-10-CM

## 2014-04-01 MED ORDER — BECLOMETHASONE DIPROPIONATE 40 MCG/ACT IN AERS: 2.0000 | INHALATION_SPRAY | Freq: Two times a day (BID) | RESPIRATORY_TRACT | Status: DC

## 2014-04-01 MED ORDER — BENZONATATE 100 MG PO CAPS: 100.0000 mg | ORAL_CAPSULE | Freq: Three times a day (TID) | ORAL | Status: DC | PRN

## 2014-04-01 MED ORDER — ALBUTEROL SULFATE HFA 108 (90 BASE) MCG/ACT IN AERS: 2.0000 | INHALATION_SPRAY | Freq: Four times a day (QID) | RESPIRATORY_TRACT | Status: DC | PRN

## 2014-04-01 MED ORDER — ALBUTEROL SULFATE (2.5 MG/3ML) 0.083% IN NEBU
2.5000 mg | INHALATION_SOLUTION | Freq: Once | RESPIRATORY_TRACT | Status: AC
  Administered 2014-04-01: 2.5 mg via RESPIRATORY_TRACT

## 2014-04-01 MED ORDER — AMOXICILLIN-POT CLAVULANATE 875-125 MG PO TABS: 1.0000 | ORAL_TABLET | Freq: Two times a day (BID) | ORAL | Status: DC

## 2014-04-01 NOTE — Progress Notes (Signed)
Subjective:    Patient ID: Jennifer Kelly, female    DOB: [birth year], 53 y.o.   MRN: [id number]  HPI   Pt in with cough. Pt states 2 days of chest congestion. Pt felt like may need to bring up mucous this am. Today she has sinus pressure. Pt has history of smoking. Pack a day for 30 yrs or more. Less than half a pack recently. Some sweats last night and sweating last night.  Past Medical History  Diagnosis Date  . Arthritis   . Hypertension   . Shingles 2014  . Chicken pox as a child  . Mumps as a child  . History of chicken pox   . H/O mumps   . Acute bronchitis 01/05/2014    History   Social History  . Marital Status: Married    Spouse Name: N/A    Number of Children: N/A  . Years of Education: N/A   Occupational History  . Not on file.   Social History Main Topics  . Smoking status: Current Every Day Smoker  . Smokeless tobacco: Not on file  . Alcohol Use: Yes     Comment: occ wine  . Drug Use: No  . Sexual Activity: Not on file   Other Topics Concern  . Not on file   Social History Narrative    Past Surgical History  Procedure Laterality Date  . Tooth extraction  05/2012  . Wisdom tooth extraction  53 yrs old  . Back surgery  2003  . Rotater cuff  20 yrs ago    left shoulder    Family History  Problem Relation Age of Onset  . Parkinson's disease Mother   . Heart disease Mother   . Hypertension Mother   . Diabetes Father     type 2  . Hypertension Father   . Vascular Disease Brother   . Hypertension Brother   . Diabetes Brother   . Heart attack Paternal Uncle     No Known Allergies  Current Outpatient Prescriptions on File Prior to Visit  Medication Sig Dispense Refill  . abatacept (ORENCIA) 250 MG injection Inject into the vein once a week.    . citalopram (CELEXA) 20 MG tablet take 1 tablet by mouth once daily 30 tablet 10  . diltiazem (CARDIZEM CD) 240 MG 24 hr capsule Take 1 capsule (240 mg total) by mouth daily. 30 capsule 11  .  DURAGESIC 100 MCG/HR Place 1 patch (100 mcg total) onto the skin every 3 (three) days. Can fill on 03/27/14 10 patch 0  . gabapentin (NEURONTIN) 600 MG tablet take 1 tablet by mouth three times a day 90 tablet 4  . ibuprofen (ADVIL,MOTRIN) 800 MG tablet     . losartan-hydrochlorothiazide (HYZAAR) 50-12.5 MG per tablet Take 1 tablet by mouth daily. 30 tablet 11  . oxyCODONE-acetaminophen (PERCOCET) 10-325 MG per tablet Take 2 tablets by mouth every 6 (six) hours as needed for pain. Can fill on 03/18/14 180 tablet 0  . prednisoLONE acetate (PRED FORTE) 1 % ophthalmic suspension Place 1 drop into the left eye 4 (four) times daily.    . predniSONE (DELTASONE) 5 MG tablet Take 10 mg by mouth daily.     . VENTOLIN HFA 108 (90 BASE) MCG/ACT inhaler inhale 2 puffs by mouth every 6 hours if needed 18 g 4   No current facility-administered medications on file prior to visit.    BP 120/77 mmHg  Pulse 73  Temp(Src) 98.4 F (  36.9 C) (Oral)  Ht 6' (1.829 m)  Wt 250 lb 9.6 oz (113.671 kg)  BMI 33.98 kg/m2  SpO2 95%      Review of Systems  Constitutional: Positive for fever and diaphoresis. Negative for chills and fatigue.       Subjective fever.  HENT: Positive for congestion and sinus pressure. Negative for ear discharge, ear pain, rhinorrhea, sneezing and sore throat.   Respiratory: Positive for cough and wheezing. Negative for chest tightness and shortness of breath.        Deep breathing and can hear audible wheeze.  Cardiovascular: Negative for chest pain and palpitations.  Gastrointestinal: Negative.   Genitourinary: Negative.   Musculoskeletal: Negative.   Neurological: Negative.   Hematological: Negative for adenopathy. Does not bruise/bleed easily.       Objective:   Physical Exam   General  Mental Status - Alert. General Appearance - Well groomed. Not in acute distress.  Skin Rashes- No Rashes.  HEENT Head- Normal. Ear Auditory Canal - Left- Normal. Right -  Normal.Tympanic Membrane- Left- Normal. Right- Normal. Eye Sclera/Conjunctiva- Left- Normal. Right- Normal. Nose & Sinuses Nasal Mucosa- Left-  Boggy + Congested. Right-  Boggy + Congested.Maxillary sinus pressure. Mouth & Throat Lips: Upper Lip- Normal: no dryness, cracking, pallor, cyanosis, or vesicular eruption. Lower Lip-Normal: no dryness, cracking, pallor, cyanosis or vesicular eruption. Buccal Mucosa- Bilateral- No Aphthous ulcers. Oropharynx- No Discharge or Erythema. Tonsils: Characteristics- Bilateral- No Erythema or Congestion. Size/Enlargement- Bilateral- No enlargement. Discharge- bilateral-None.  Neck Neck- Supple. No Masses.   Chest and Lung Exam Auscultation: Breath Sounds:-Diffuse rhonchi throughout. Post neb tx respirations became much clearer.  Cardiovascular Auscultation:Rythm- Regular.  Murmurs & Other Heart Sounds:Ausculatation of the heart reveal- No Murmurs.  Lymphatic Head & Neck General Head & Neck Lymphatics: Bilateral: Description- No Localized lymphadenopathy.           Assessment & Plan:

## 2014-04-01 NOTE — Assessment & Plan Note (Signed)
Qvar and albuterol inhalers. If wheezing persist despite these 2 meds then start prednisone.

## 2014-04-01 NOTE — Assessment & Plan Note (Addendum)
Bronchitis and possible sinusitis as well. Based on the way your lungs sound, I do want to get cxr. R/O pneumonia. Rx augmentin.

## 2014-04-01 NOTE — Patient Instructions (Signed)
You likely have bronchitis and possible sinusitis as well. Based on the way your lungs sound, I do want to get cxr.  We gave you a neb treatment in the office as well.  I am sending augmentin antibiotic to your pharmacy, benzonatate for your cough, and 2 inhalers.  If your wheezing worsens despite the above then start short course prednisone.  Follow up in 7-10 days or as needed.

## 2014-04-01 NOTE — Progress Notes (Signed)
Pre visit review using our clinic review tool, if applicable. No additional management support is needed unless otherwise documented below in the visit note. 

## 2014-04-07 ENCOUNTER — Encounter: Payer: Self-pay | Admitting: Family Medicine

## 2014-04-07 ENCOUNTER — Ambulatory Visit (INDEPENDENT_AMBULATORY_CARE_PROVIDER_SITE_OTHER): Payer: 59 | Admitting: Family Medicine

## 2014-04-07 VITALS — BP 129/79 | HR 80 | Temp 98.1°F | Ht 69.0 in | Wt 246.6 lb

## 2014-04-07 DIAGNOSIS — M545 Low back pain: Secondary | ICD-10-CM

## 2014-04-07 DIAGNOSIS — I10 Essential (primary) hypertension: Secondary | ICD-10-CM

## 2014-04-07 DIAGNOSIS — F418 Other specified anxiety disorders: Secondary | ICD-10-CM

## 2014-04-07 DIAGNOSIS — F17200 Nicotine dependence, unspecified, uncomplicated: Secondary | ICD-10-CM

## 2014-04-07 DIAGNOSIS — E669 Obesity, unspecified: Secondary | ICD-10-CM

## 2014-04-07 DIAGNOSIS — Z720 Tobacco use: Secondary | ICD-10-CM

## 2014-04-07 DIAGNOSIS — R52 Pain, unspecified: Secondary | ICD-10-CM

## 2014-04-07 DIAGNOSIS — Z0000 Encounter for general adult medical examination without abnormal findings: Secondary | ICD-10-CM

## 2014-04-07 DIAGNOSIS — J208 Acute bronchitis due to other specified organisms: Secondary | ICD-10-CM

## 2014-04-07 DIAGNOSIS — M069 Rheumatoid arthritis, unspecified: Secondary | ICD-10-CM

## 2014-04-07 MED ORDER — OXYCODONE-ACETAMINOPHEN 10-325 MG PO TABS: 2.0000 | ORAL_TABLET | ORAL | Status: DC | PRN

## 2014-04-07 NOTE — Progress Notes (Signed)
Pre visit review using our clinic review tool, if applicable. No additional management support is needed unless otherwise documented below in the visit note. 

## 2014-04-07 NOTE — Patient Instructions (Addendum)
Rel of rec rheumatology Dr Tobie Lords labs and notes over last 6 months  Preventive Care for Adults A healthy lifestyle and preventive care can promote health and wellness. Preventive health guidelines for women include the following key practices.  A routine yearly physical is a good way to check with your health care provider about your health and preventive screening. It is a chance to share any concerns and updates on your health and to receive a thorough exam.  Visit your dentist for a routine exam and preventive care every 6 months. Brush your teeth twice a day and floss once a day. Good oral hygiene prevents tooth decay and gum disease.  The frequency of eye exams is based on your age, health, family medical history, use of contact lenses, and other factors. Follow your health care provider's recommendations for frequency of eye exams.  Eat a healthy diet. Foods like vegetables, fruits, whole grains, low-fat dairy products, and lean protein foods contain the nutrients you need without too many calories. Decrease your intake of foods high in solid fats, added sugars, and salt. Eat the right amount of calories for you.Get information about a proper diet from your health care provider, if necessary.  Regular physical exercise is one of the most important things you can do for your health. Most adults should get at least 150 minutes of moderate-intensity exercise (any activity that increases your heart rate and causes you to sweat) each week. In addition, most adults need muscle-strengthening exercises on 2 or more days a week.  Maintain a healthy weight. The body mass index (BMI) is a screening tool to identify possible weight problems. It provides an estimate of body fat based on height and weight. Your health care provider can find your BMI and can help you achieve or maintain a healthy weight.For adults 20 years and older:  A BMI below 18.5 is considered underweight.  A BMI of 18.5 to  24.9 is normal.  A BMI of 25 to 29.9 is considered overweight.  A BMI of 30 and above is considered obese.  Maintain normal blood lipids and cholesterol levels by exercising and minimizing your intake of saturated fat. Eat a balanced diet with plenty of fruit and vegetables. Blood tests for lipids and cholesterol should begin at age 64 and be repeated every 5 years. If your lipid or cholesterol levels are high, you are over 50, or you are at high risk for heart disease, you may need your cholesterol levels checked more frequently.Ongoing high lipid and cholesterol levels should be treated with medicines if diet and exercise are not working.  If you smoke, find out from your health care provider how to quit. If you do not use tobacco, do not start.  Lung cancer screening is recommended for adults aged 2-80 years who are at high risk for developing lung cancer because of a history of smoking. A yearly low-dose CT scan of the lungs is recommended for people who have at least a 30-pack-year history of smoking and are a current smoker or have quit within the past 15 years. A pack year of smoking is smoking an average of 1 pack of cigarettes a day for 1 year (for example: 1 pack a day for 30 years or 2 packs a day for 15 years). Yearly screening should continue until the smoker has stopped smoking for at least 15 years. Yearly screening should be stopped for people who develop a health problem that would prevent them from having lung  cancer treatment.  If you are pregnant, do not drink alcohol. If you are breastfeeding, be very cautious about drinking alcohol. If you are not pregnant and choose to drink alcohol, do not have more than 1 drink per day. One drink is considered to be 12 ounces (355 mL) of beer, 5 ounces (148 mL) of wine, or 1.5 ounces (44 mL) of liquor.  Avoid use of street drugs. Do not share needles with anyone. Ask for help if you need support or instructions about stopping the use of  drugs.  High blood pressure causes heart disease and increases the risk of stroke. Your blood pressure should be checked at least every 1 to 2 years. Ongoing high blood pressure should be treated with medicines if weight loss and exercise do not work.  If you are 55-79 years old, ask your health care provider if you should take aspirin to prevent strokes.  Diabetes screening involves taking a blood sample to check your fasting blood sugar level. This should be done once every 3 years, after age 45, if you are within normal weight and without risk factors for diabetes. Testing should be considered at a younger age or be carried out more frequently if you are overweight and have at least 1 risk factor for diabetes.  Breast cancer screening is essential preventive care for women. You should practice "breast self-awareness." This means understanding the normal appearance and feel of your breasts and may include breast self-examination. Any changes detected, no matter how small, should be reported to a health care provider. Women in their 20s and 30s should have a clinical breast exam (CBE) by a health care provider as part of a regular health exam every 1 to 3 years. After age 40, women should have a CBE every year. Starting at age 40, women should consider having a mammogram (breast X-ray test) every year. Women who have a family history of breast cancer should talk to their health care provider about genetic screening. Women at a high risk of breast cancer should talk to their health care providers about having an MRI and a mammogram every year.  Breast cancer gene (BRCA)-related cancer risk assessment is recommended for women who have family members with BRCA-related cancers. BRCA-related cancers include breast, ovarian, tubal, and peritoneal cancers. Having family members with these cancers may be associated with an increased risk for harmful changes (mutations) in the breast cancer genes BRCA1 and BRCA2.  Results of the assessment will determine the need for genetic counseling and BRCA1 and BRCA2 testing.  Routine pelvic exams to screen for cancer are no longer recommended for nonpregnant women who are considered low risk for cancer of the pelvic organs (ovaries, uterus, and vagina) and who do not have symptoms. Ask your health care provider if a screening pelvic exam is right for you.  If you have had past treatment for cervical cancer or a condition that could lead to cancer, you need Pap tests and screening for cancer for at least 20 years after your treatment. If Pap tests have been discontinued, your risk factors (such as having a new sexual partner) need to be reassessed to determine if screening should be resumed. Some women have medical problems that increase the chance of getting cervical cancer. In these cases, your health care provider may recommend more frequent screening and Pap tests.  The HPV test is an additional test that may be used for cervical cancer screening. The HPV test looks for the virus that can cause   the cell changes on the cervix. The cells collected during the Pap test can be tested for HPV. The HPV test could be used to screen women aged 64 years and older, and should be used in women of any age who have unclear Pap test results. After the age of 12, women should have HPV testing at the same frequency as a Pap test.  Colorectal cancer can be detected and often prevented. Most routine colorectal cancer screening begins at the age of 47 years and continues through age 2 years. However, your health care provider may recommend screening at an earlier age if you have risk factors for colon cancer. On a yearly basis, your health care provider may provide home test kits to check for hidden blood in the stool. Use of a small camera at the end of a tube, to directly examine the colon (sigmoidoscopy or colonoscopy), can detect the earliest forms of colorectal cancer. Talk to your health  care provider about this at age 65, when routine screening begins. Direct exam of the colon should be repeated every 5-10 years through age 18 years, unless early forms of pre-cancerous polyps or small growths are found.  People who are at an increased risk for hepatitis B should be screened for this virus. You are considered at high risk for hepatitis B if:  You were born in a country where hepatitis B occurs often. Talk with your health care provider about which countries are considered high risk.  Your parents were born in a high-risk country and you have not received a shot to protect against hepatitis B (hepatitis B vaccine).  You have HIV or AIDS.  You use needles to inject street drugs.  You live with, or have sex with, someone who has hepatitis B.  You get hemodialysis treatment.  You take certain medicines for conditions like cancer, organ transplantation, and autoimmune conditions.  Hepatitis C blood testing is recommended for all people born from [birth year] through 1965 and any individual with known risks for hepatitis C.  Practice safe sex. Use condoms and avoid high-risk sexual practices to reduce the spread of sexually transmitted infections (STIs). STIs include gonorrhea, chlamydia, syphilis, trichomonas, herpes, HPV, and human immunodeficiency virus (HIV). Herpes, HIV, and HPV are viral illnesses that have no cure. They can result in disability, cancer, and death.  You should be screened for sexually transmitted illnesses (STIs) including gonorrhea and chlamydia if:  You are sexually active and are younger than 24 years.  You are older than 24 years and your health care provider tells you that you are at risk for this type of infection.  Your sexual activity has changed since you were last screened and you are at an increased risk for chlamydia or gonorrhea. Ask your health care provider if you are at risk.  If you are at risk of being infected with HIV, it is recommended  that you take a prescription medicine daily to prevent HIV infection. This is called preexposure prophylaxis (PrEP). You are considered at risk if:  You are a heterosexual woman, are sexually active, and are at increased risk for HIV infection.  You take drugs by injection.  You are sexually active with a partner who has HIV.  Talk with your health care provider about whether you are at high risk of being infected with HIV. If you choose to begin PrEP, you should first be tested for HIV. You should then be tested every 3 months for as long as you are  taking PrEP.  Osteoporosis is a disease in which the bones lose minerals and strength with aging. This can result in serious bone fractures or breaks. The risk of osteoporosis can be identified using a bone density scan. Women ages 65 years and over and women at risk for fractures or osteoporosis should discuss screening with their health care providers. Ask your health care provider whether you should take a calcium supplement or vitamin D to reduce the rate of osteoporosis.  Menopause can be associated with physical symptoms and risks. Hormone replacement therapy is available to decrease symptoms and risks. You should talk to your health care provider about whether hormone replacement therapy is right for you.  Use sunscreen. Apply sunscreen liberally and repeatedly throughout the day. You should seek shade when your shadow is shorter than you. Protect yourself by wearing long sleeves, pants, a wide-brimmed hat, and sunglasses year round, whenever you are outdoors.  Once a month, do a whole body skin exam, using a mirror to look at the skin on your back. Tell your health care provider of new moles, moles that have irregular borders, moles that are larger than a pencil eraser, or moles that have changed in shape or color.  Stay current with required vaccines (immunizations).  Influenza vaccine. All adults should be immunized every year.  Tetanus,  diphtheria, and acellular pertussis (Td, Tdap) vaccine. Pregnant women should receive 1 dose of Tdap vaccine during each pregnancy. The dose should be obtained regardless of the length of time since the last dose. Immunization is preferred during the 27th-36th week of gestation. An adult who has not previously received Tdap or who does not know her vaccine status should receive 1 dose of Tdap. This initial dose should be followed by tetanus and diphtheria toxoids (Td) booster doses every 10 years. Adults with an unknown or incomplete history of completing a 3-dose immunization series with Td-containing vaccines should begin or complete a primary immunization series including a Tdap dose. Adults should receive a Td booster every 10 years.  Varicella vaccine. An adult without evidence of immunity to varicella should receive 2 doses or a second dose if she has previously received 1 dose. Pregnant females who do not have evidence of immunity should receive the first dose after pregnancy. This first dose should be obtained before leaving the health care facility. The second dose should be obtained 4-8 weeks after the first dose.  Human papillomavirus (HPV) vaccine. Females aged 13-26 years who have not received the vaccine previously should obtain the 3-dose series. The vaccine is not recommended for use in pregnant females. However, pregnancy testing is not needed before receiving a dose. If a female is found to be pregnant after receiving a dose, no treatment is needed. In that case, the remaining doses should be delayed until after the pregnancy. Immunization is recommended for any person with an immunocompromised condition through the age of 26 years if she did not get any or all doses earlier. During the 3-dose series, the second dose should be obtained 4-8 weeks after the first dose. The third dose should be obtained 24 weeks after the first dose and 16 weeks after the second dose.  Zoster vaccine. One dose  is recommended for adults aged 60 years or older unless certain conditions are present.  Measles, mumps, and rubella (MMR) vaccine. Adults born before 1957 generally are considered immune to measles and mumps. Adults born in 1957 or later should have 1 or more doses of MMR vaccine unless   there is a contraindication to the vaccine or there is laboratory evidence of immunity to each of the three diseases. A routine second dose of MMR vaccine should be obtained at least 28 days after the first dose for students attending postsecondary schools, health care workers, or international travelers. People who received inactivated measles vaccine or an unknown type of measles vaccine during 1963-1967 should receive 2 doses of MMR vaccine. People who received inactivated mumps vaccine or an unknown type of mumps vaccine before 1979 and are at high risk for mumps infection should consider immunization with 2 doses of MMR vaccine. For females of childbearing age, rubella immunity should be determined. If there is no evidence of immunity, females who are not pregnant should be vaccinated. If there is no evidence of immunity, females who are pregnant should delay immunization until after pregnancy. Unvaccinated health care workers born before [birth year] who lack laboratory evidence of measles, mumps, or rubella immunity or laboratory confirmation of disease should consider measles and mumps immunization with 2 doses of MMR vaccine or rubella immunization with 1 dose of MMR vaccine.  Pneumococcal 13-valent conjugate (PCV13) vaccine. When indicated, a person who is uncertain of her immunization history and has no record of immunization should receive the PCV13 vaccine. An adult aged 76 years or older who has certain medical conditions and has not been previously immunized should receive 1 dose of PCV13 vaccine. This PCV13 should be followed with a dose of pneumococcal polysaccharide (PPSV23) vaccine. The PPSV23 vaccine dose should be  obtained at least 8 weeks after the dose of PCV13 vaccine. An adult aged 70 years or older who has certain medical conditions and previously received 1 or more doses of PPSV23 vaccine should receive 1 dose of PCV13. The PCV13 vaccine dose should be obtained 1 or more years after the last PPSV23 vaccine dose.  Pneumococcal polysaccharide (PPSV23) vaccine. When PCV13 is also indicated, PCV13 should be obtained first. All adults aged 66 years and older should be immunized. An adult younger than age 66 years who has certain medical conditions should be immunized. Any person who resides in a nursing home or long-term care facility should be immunized. An adult smoker should be immunized. People with an immunocompromised condition and certain other conditions should receive both PCV13 and PPSV23 vaccines. People with human immunodeficiency virus (HIV) infection should be immunized as soon as possible after diagnosis. Immunization during chemotherapy or radiation therapy should be avoided. Routine use of PPSV23 vaccine is not recommended for American Indians, [place] Natives, or people younger than 65 years unless there are medical conditions that require PPSV23 vaccine. When indicated, people who have unknown immunization and have no record of immunization should receive PPSV23 vaccine. One-time revaccination 5 years after the first dose of PPSV23 is recommended for people aged 19-64 years who have chronic kidney failure, nephrotic syndrome, asplenia, or immunocompromised conditions. People who received 1-2 doses of PPSV23 before age 99 years should receive another dose of PPSV23 vaccine at age 53 years or later if at least 5 years have passed since the previous dose. Doses of PPSV23 are not needed for people immunized with PPSV23 at or after age 10 years.  Meningococcal vaccine. Adults with asplenia or persistent complement component deficiencies should receive 2 doses of quadrivalent meningococcal conjugate  (MenACWY-D) vaccine. The doses should be obtained at least 2 months apart. Microbiologists working with certain meningococcal bacteria, [place] recruits, people at risk during an outbreak, and people who travel to or live in countries with  a high rate of meningitis should be immunized. A first-year college student up through age 35 years who is living in a residence hall should receive a dose if she did not receive a dose on or after her 16th birthday. Adults who have certain high-risk conditions should receive one or more doses of vaccine.  Hepatitis A vaccine. Adults who wish to be protected from this disease, have certain high-risk conditions, work with hepatitis A-infected animals, work in hepatitis A research labs, or travel to or work in countries with a high rate of hepatitis A should be immunized. Adults who were previously unvaccinated and who anticipate close contact with an international adoptee during the first 60 days after arrival in the [place] from a country with a high rate of hepatitis A should be immunized.  Hepatitis B vaccine. Adults who wish to be protected from this disease, have certain high-risk conditions, may be exposed to blood or other infectious body fluids, are household contacts or sex partners of hepatitis B positive people, are clients or workers in certain care facilities, or travel to or work in countries with a high rate of hepatitis B should be immunized.  Haemophilus influenzae type b (Hib) vaccine. A previously unvaccinated person with asplenia or sickle cell disease or having a scheduled splenectomy should receive 1 dose of Hib vaccine. Regardless of previous immunization, a recipient of a hematopoietic stem cell transplant should receive a 3-dose series 6-12 months after her successful transplant. Hib vaccine is not recommended for adults with HIV infection. Preventive Services / Frequency Ages 44 to 59 years  Blood pressure check.** / Every 1 to 2  years.  Lipid and cholesterol check.** / Every 5 years beginning at age 28.  Clinical breast exam.** / Every 3 years for women in their 55s and 55s.  BRCA-related cancer risk assessment.** / For women who have family members with a BRCA-related cancer (breast, ovarian, tubal, or peritoneal cancers).  Pap test.** / Every 2 years from ages 4 through 58. Every 3 years starting at age 44 through age 52 or 7 with a history of 3 consecutive normal Pap tests.  HPV screening.** / Every 3 years from ages 73 through ages 61 to 71 with a history of 3 consecutive normal Pap tests.  Hepatitis C blood test.** / For any individual with known risks for hepatitis C.  Skin self-exam. / Monthly.  Influenza vaccine. / Every year.  Tetanus, diphtheria, and acellular pertussis (Tdap, Td) vaccine.** / Consult your health care provider. Pregnant women should receive 1 dose of Tdap vaccine during each pregnancy. 1 dose of Td every 10 years.  Varicella vaccine.** / Consult your health care provider. Pregnant females who do not have evidence of immunity should receive the first dose after pregnancy.  HPV vaccine. / 3 doses over 6 months, if 18 and younger. The vaccine is not recommended for use in pregnant females. However, pregnancy testing is not needed before receiving a dose.  Measles, mumps, rubella (MMR) vaccine.** / You need at least 1 dose of MMR if you were born in 1957 or later. You may also need a 2nd dose. For females of childbearing age, rubella immunity should be determined. If there is no evidence of immunity, females who are not pregnant should be vaccinated. If there is no evidence of immunity, females who are pregnant should delay immunization until after pregnancy.  Pneumococcal 13-valent conjugate (PCV13) vaccine.** / Consult your health care provider.  Pneumococcal polysaccharide (PPSV23) vaccine.** / 1 to  2 doses if you smoke cigarettes or if you have certain conditions.  Meningococcal  vaccine.** / 1 dose if you are age 19 to 21 years and a first-year college student living in a residence hall, or have one of several medical conditions, you need to get vaccinated against meningococcal disease. You may also need additional booster doses.  Hepatitis A vaccine.** / Consult your health care provider.  Hepatitis B vaccine.** / Consult your health care provider.  Haemophilus influenzae type b (Hib) vaccine.** / Consult your health care provider. Ages 40 to 64 years  Blood pressure check.** / Every 1 to 2 years.  Lipid and cholesterol check.** / Every 5 years beginning at age 20 years.  Lung cancer screening. / Every year if you are aged 55-80 years and have a 30-pack-year history of smoking and currently smoke or have quit within the past 15 years. Yearly screening is stopped once you have quit smoking for at least 15 years or develop a health problem that would prevent you from having lung cancer treatment.  Clinical breast exam.** / Every year after age 40 years.  BRCA-related cancer risk assessment.** / For women who have family members with a BRCA-related cancer (breast, ovarian, tubal, or peritoneal cancers).  Mammogram.** / Every year beginning at age 40 years and continuing for as long as you are in good health. Consult with your health care provider.  Pap test.** / Every 3 years starting at age 30 years through age 65 or 70 years with a history of 3 consecutive normal Pap tests.  HPV screening.** / Every 3 years from ages 30 years through ages 65 to 70 years with a history of 3 consecutive normal Pap tests.  Fecal occult blood test (FOBT) of stool. / Every year beginning at age 50 years and continuing until age 75 years. You may not need to do this test if you get a colonoscopy every 10 years.  Flexible sigmoidoscopy or colonoscopy.** / Every 5 years for a flexible sigmoidoscopy or every 10 years for a colonoscopy beginning at age 50 years and continuing until age 75  years.  Hepatitis C blood test.** / For all people born from 1945 through 1965 and any individual with known risks for hepatitis C.  Skin self-exam. / Monthly.  Influenza vaccine. / Every year.  Tetanus, diphtheria, and acellular pertussis (Tdap/Td) vaccine.** / Consult your health care provider. Pregnant women should receive 1 dose of Tdap vaccine during each pregnancy. 1 dose of Td every 10 years.  Varicella vaccine.** / Consult your health care provider. Pregnant females who do not have evidence of immunity should receive the first dose after pregnancy.  Zoster vaccine.** / 1 dose for adults aged 60 years or older.  Measles, mumps, rubella (MMR) vaccine.** / You need at least 1 dose of MMR if you were born in 1957 or later. You may also need a 2nd dose. For females of childbearing age, rubella immunity should be determined. If there is no evidence of immunity, females who are not pregnant should be vaccinated. If there is no evidence of immunity, females who are pregnant should delay immunization until after pregnancy.  Pneumococcal 13-valent conjugate (PCV13) vaccine.** / Consult your health care provider.  Pneumococcal polysaccharide (PPSV23) vaccine.** / 1 to 2 doses if you smoke cigarettes or if you have certain conditions.  Meningococcal vaccine.** / Consult your health care provider.  Hepatitis A vaccine.** / Consult your health care provider.  Hepatitis B vaccine.** / Consult your health care   provider.  Haemophilus influenzae type b (Hib) vaccine.** / Consult your health care provider. Ages 76 years and over  Blood pressure check.** / Every 1 to 2 years.  Lipid and cholesterol check.** / Every 5 years beginning at age 45 years.  Lung cancer screening. / Every year if you are aged 29-80 years and have a 30-pack-year history of smoking and currently smoke or have quit within the past 15 years. Yearly screening is stopped once you have quit smoking for at least 15 years or  develop a health problem that would prevent you from having lung cancer treatment.  Clinical breast exam.** / Every year after age 64 years.  BRCA-related cancer risk assessment.** / For women who have family members with a BRCA-related cancer (breast, ovarian, tubal, or peritoneal cancers).  Mammogram.** / Every year beginning at age 62 years and continuing for as long as you are in good health. Consult with your health care provider.  Pap test.** / Every 3 years starting at age 87 years through age 31 or 2 years with 3 consecutive normal Pap tests. Testing can be stopped between 65 and 70 years with 3 consecutive normal Pap tests and no abnormal Pap or HPV tests in the past 10 years.  HPV screening.** / Every 3 years from ages 51 years through ages 4 or 63 years with a history of 3 consecutive normal Pap tests. Testing can be stopped between 65 and 70 years with 3 consecutive normal Pap tests and no abnormal Pap or HPV tests in the past 10 years.  Fecal occult blood test (FOBT) of stool. / Every year beginning at age 96 years and continuing until age 55 years. You may not need to do this test if you get a colonoscopy every 10 years.  Flexible sigmoidoscopy or colonoscopy.** / Every 5 years for a flexible sigmoidoscopy or every 10 years for a colonoscopy beginning at age 62 years and continuing until age 63 years.  Hepatitis C blood test.** / For all people born from [birth year] through 1966 and any individual with known risks for hepatitis C.  Osteoporosis screening.** / A one-time screening for women ages 17 years and over and women at risk for fractures or osteoporosis.  Skin self-exam. / Monthly.  Influenza vaccine. / Every year.  Tetanus, diphtheria, and acellular pertussis (Tdap/Td) vaccine.** / 1 dose of Td every 10 years.  Varicella vaccine.** / Consult your health care provider.  Zoster vaccine.** / 1 dose for adults aged 87 years or older.  Pneumococcal 13-valent conjugate  (PCV13) vaccine.** / Consult your health care provider.  Pneumococcal polysaccharide (PPSV23) vaccine.** / 1 dose for all adults aged 51 years and older.  Meningococcal vaccine.** / Consult your health care provider.  Hepatitis A vaccine.** / Consult your health care provider.  Hepatitis B vaccine.** / Consult your health care provider.  Haemophilus influenzae type b (Hib) vaccine.** / Consult your health care provider. ** Family history and personal history of risk and conditions may change your health care provider's recommendations. Document Released: 07/04/2001 Document Revised: 09/22/2013 Document Reviewed: 10/03/2010 [facility] Patient Information 2015 [place], [place]. This information is not intended to replace advice given to you by your health care provider. Make sure you discuss any questions you have with your health care provider.

## 2014-04-07 NOTE — Assessment & Plan Note (Signed)
Encouraged complete cessation. Discussed need to quit as relates to risk of numerous cancers, cardiac and pulmonary disease as well as neurologic complications. Counseled for greater than 3 minutes 

## 2014-04-07 NOTE — Assessment & Plan Note (Addendum)
Patient encouraged to maintain heart healthy diet, regular exercise, adequate sleep. Consider daily probiotics. Take medications as prescribed. Ortho Dr Onnie Graham at [facility] ortho, manages right shoulder, s/p collar bone partially resected and bursa and bone spurs cleaned in July 2015 Dr Tobie Lords with [facility] is her Rheumatologist Dr Satira Sark opthamology at [facility] optho [facility] due end of November 2015 Dr Sherwood Gambler neurosurgery has done one surgery in low back and his group manages whole back

## 2014-04-08 ENCOUNTER — Ambulatory Visit: Payer: 59

## 2014-04-12 NOTE — Assessment & Plan Note (Signed)
Encouraged DASH diet, decrease po intake and increase exercise as tolerated. Needs 7-8 hours of sleep nightly. Avoid trans fats, eat small, frequent meals every 4-5 hours with lean proteins, complex carbs and healthy fats. Minimize simple carbs 

## 2014-04-12 NOTE — Assessment & Plan Note (Signed)
Well controlled, no changes to meds. Encouraged heart healthy diet such as the DASH diet and exercise as tolerated.  °

## 2014-04-12 NOTE — Assessment & Plan Note (Signed)
Improving with treatment.  ?

## 2014-04-12 NOTE — Assessment & Plan Note (Signed)
Chronic pain, is noting some improvement with Fentanyl patch. Will continue to titrate dosing by allowing her to change patch every 2-3 days. Allowed refill on her break through pain med.

## 2014-04-12 NOTE — Assessment & Plan Note (Signed)
Manageable on current meds. No changes

## 2014-04-12 NOTE — Progress Notes (Signed)
Jennifer Kelly  [id number] 1960-10-24 04/12/2014      Progress Note-Follow Up  Subjective  Chief Complaint  Chief Complaint  Patient presents with  . Annual Exam    physical    HPI  Patient is a 53 y.o. female in today for routine medical care. Patient reports the Fentanyl patch has been helpful but she has still needed breakthrough doses of pain tabs. No concerns with med. No new constipation or nausea. No recent illness. 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 Denies CP/palp/SOB/HA/congestion/fevers/GI or GU c/o. Taking meds as prescribed  Past Medical History  Diagnosis Date  . Arthritis   . Hypertension   . Shingles 2014  . Chicken pox as a child  . Mumps as a child  . History of chicken pox   . H/O mumps   . Acute bronchitis 01/05/2014    Past Surgical History  Procedure Laterality Date  . Tooth extraction  05/2012  . Wisdom tooth extraction  53 yrs old  . Back surgery  2003  . Rotater cuff  20 yrs ago    left shoulder    Family History  Problem Relation Age of Onset  . Parkinson's disease Mother   . Heart disease Mother   . Hypertension Mother   . Diabetes Father     type 2  . Hypertension Father   . Stroke Father   . Vascular Disease Brother   . Hypertension Brother   . Diabetes Brother   . Heart attack Paternal Uncle     History   Social History  . Marital Status: Married    Spouse Name: N/A    Number of Children: N/A  . Years of Education: N/A   Occupational History  . Not on file.   Social History Main Topics  . Smoking status: Current Every Day Smoker  . Smokeless tobacco: Not on file  . Alcohol Use: Yes     Comment: occ wine  . Drug Use: No  . Sexual Activity: No     Comment: no dietary restrictions. lives alone   Other Topics Concern  . Not on file   Social History Narrative    Current Outpatient Prescriptions on File Prior to Visit  Medication Sig Dispense Refill  . abatacept (ORENCIA) 250 MG injection  Inject into the vein once a week.    Marland Kitchen albuterol (PROVENTIL HFA;VENTOLIN HFA) 108 (90 BASE) MCG/ACT inhaler Inhale 2 puffs into the lungs every 6 (six) hours as needed for wheezing or shortness of breath. 1 Inhaler 0  . amoxicillin-clavulanate (AUGMENTIN) 875-125 MG per tablet Take 1 tablet by mouth 2 (two) times daily. 20 tablet 0  . beclomethasone (QVAR) 40 MCG/ACT inhaler Inhale 2 puffs into the lungs 2 (two) times daily at 10 AM and 5 PM. 1 Inhaler 2  . benzonatate (TESSALON) 100 MG capsule Take 1 capsule (100 mg total) by mouth 3 (three) times daily as needed. 21 capsule 0  . citalopram (CELEXA) 20 MG tablet take 1 tablet by mouth once daily 30 tablet 10  . diltiazem (CARDIZEM CD) 240 MG 24 hr capsule Take 1 capsule (240 mg total) by mouth daily. 30 capsule 11  . DURAGESIC 100 MCG/HR Place 1 patch (100 mcg total) onto the skin every 3 (three) days. Can fill on 03/27/14 10 patch 0  . gabapentin (NEURONTIN) 600 MG tablet take 1 tablet by mouth three times a day 90 tablet 4  . ibuprofen (ADVIL,MOTRIN) 800 MG tablet     .  losartan-hydrochlorothiazide (HYZAAR) 50-12.5 MG per tablet Take 1 tablet by mouth daily. 30 tablet 11  . prednisoLONE acetate (PRED FORTE) 1 % ophthalmic suspension Place 1 drop into the left eye 4 (four) times daily.    . predniSONE (DELTASONE) 5 MG tablet Take 10 mg by mouth daily.      No current facility-administered medications on file prior to visit.    No Known Allergies  Review of Systems  Review of Systems  Constitutional: Negative for fever and malaise/fatigue.  HENT: Negative for congestion.   Eyes: Negative for discharge.  Respiratory: Negative for shortness of breath.   Cardiovascular: Negative for chest pain, palpitations and leg swelling.  Gastrointestinal: Negative for nausea, abdominal pain and diarrhea.  Genitourinary: Negative for dysuria.  Musculoskeletal: Positive for back pain and joint pain. Negative for falls.  Skin: Negative for rash.    Neurological: Negative for loss of consciousness and headaches.  Endo/Heme/Allergies: Negative for polydipsia.  Psychiatric/Behavioral: Negative for depression and suicidal ideas. The patient is not nervous/anxious and does not have insomnia.     Objective  BP 129/79 mmHg  Pulse 80  Temp(Src) 98.1 F (36.7 C) (Oral)  Ht 5\' 9"  (1.753 m)  Wt 246 lb 9.6 oz (111.857 kg)  BMI 36.40 kg/m2  SpO2 98%  Physical Exam  Physical Exam  Constitutional: She is oriented to person, place, and time and well-developed, well-nourished, and in no distress. No distress.  HENT:  Head: Normocephalic and atraumatic.  Eyes: Conjunctivae are normal.  Neck: Neck supple. No thyromegaly present.  Cardiovascular: Normal rate, regular rhythm and normal heart sounds.   No murmur heard. Pulmonary/Chest: Effort normal and breath sounds normal. She has no wheezes.  Abdominal: She exhibits no distension and no mass.  Musculoskeletal: She exhibits no edema.  Lymphadenopathy:    She has no cervical adenopathy.  Neurological: She is alert and oriented to person, place, and time.  Skin: Skin is warm and dry. No rash noted. She is not diaphoretic.  Psychiatric: Memory, affect and judgment normal.    Lab Results  Component Value Date   TSH 0.22* 02/17/2014   Lab Results  Component Value Date   WBC 12.3* 02/23/2014   HGB 14.4 02/23/2014   HCT 43.0 02/23/2014   MCV 88.0 02/23/2014   PLT 235.0 02/23/2014   Lab Results  Component Value Date   CREATININE 0.8 02/17/2014   BUN 13 02/17/2014   NA 133* 02/17/2014   K 3.9 02/17/2014   CL 97 02/17/2014   CO2 24 02/17/2014   Lab Results  Component Value Date   ALT 50* 02/17/2014   AST 29 02/17/2014   ALKPHOS 95 02/17/2014   BILITOT 0.7 02/17/2014   Lab Results  Component Value Date   CHOL 180 02/17/2014   Lab Results  Component Value Date   HDL 78.60 02/17/2014   Lab Results  Component Value Date   LDLCALC 79 02/17/2014   Lab Results   Component Value Date   TRIG 112.0 02/17/2014   Lab Results  Component Value Date   CHOLHDL 2 02/17/2014     Assessment & Plan  Routine health maintenance Patient encouraged to maintain heart healthy diet, regular exercise, adequate sleep. Consider daily probiotics. Take medications as prescribed. Ortho Dr Onnie Graham at [facility] ortho, manages right shoulder, s/p collar bone partially resected and bursa and bone spurs cleaned in July 2015 Dr Tobie Lords with [facility] is her Rheumatologist Dr Satira Sark opthamology at [facility] optho [facility] due end of November 2015 Dr  Nudelman neurosurgery has done one surgery in low back and his group manages whole back  TOBACCO ABUSE Encouraged complete cessation. Discussed need to quit as relates to risk of numerous cancers, cardiac and pulmonary disease as well as neurologic complications. Counseled for greater than 3 minutes  Hypertension Well controlled, no changes to meds. Encouraged heart healthy diet such as the DASH diet and exercise as tolerated.   Acute bronchitis Improving with treatment.  Obesity Encouraged DASH diet, decrease po intake and increase exercise as tolerated. Needs 7-8 hours of sleep nightly. Avoid trans fats, eat small, frequent meals every 4-5 hours with lean proteins, complex carbs and healthy fats. Minimize simple carbs  Depression with anxiety Manageable on current meds. No changes  LOW BACK PAIN Chronic pain, is noting some improvement with Fentanyl patch. Will continue to titrate dosing by allowing her to change patch every 2-3 days. Allowed refill on her break through pain med.

## 2014-04-28 ENCOUNTER — Other Ambulatory Visit: Payer: Self-pay | Admitting: Family Medicine

## 2014-04-28 DIAGNOSIS — M069 Rheumatoid arthritis, unspecified: Secondary | ICD-10-CM

## 2014-04-28 DIAGNOSIS — R52 Pain, unspecified: Secondary | ICD-10-CM

## 2014-04-28 NOTE — Telephone Encounter (Signed)
Please advise refills? Is patient supposed to see pain management?

## 2014-04-28 NOTE — Telephone Encounter (Signed)
Caller name: Birdell, Frasier Relation to pt: self  Call back number: [phone]   Reason for call:  Pt requesting a refill of fentaNYL ([place]) patch  Pt states since she has to come in and pick up fentanyl rx can she pick up  oxyCODONE-acetaminophen (PERCOCET) 10-325 MG per tablet and she will not fill rx until the 05/12/14. Please advise

## 2014-04-28 NOTE — Telephone Encounter (Signed)
I agreed to write her pain meds for now. So OK to refill if it has been 30 days.

## 2014-04-30 MED ORDER — OXYCODONE-ACETAMINOPHEN 10-325 MG PO TABS: 2.0000 | ORAL_TABLET | ORAL | Status: DC | PRN

## 2014-04-30 MED ORDER — FENTANYL 75 MCG/HR TD PT72: 75.0000 ug | MEDICATED_PATCH | TRANSDERMAL | Status: DC

## 2014-04-30 NOTE — Telephone Encounter (Signed)
Please advise writing the Oxy RX with a refill date of 05-12-14?

## 2014-04-30 NOTE — Telephone Encounter (Signed)
Left message for patient to return my call.

## 2014-04-30 NOTE — Telephone Encounter (Signed)
Patient returned phone call. Informed her of this.

## 2014-05-01 ENCOUNTER — Other Ambulatory Visit: Payer: Self-pay

## 2014-05-01 MED ORDER — FENTANYL 100 MCG/HR TD PT72: 100.0000 ug | MEDICATED_PATCH | TRANSDERMAL | Status: DC

## 2014-05-01 NOTE — Telephone Encounter (Signed)
Jennifer Kelly [phone]  Jennifer Kelly called to say she had pick up her prescriptions yesterday and later last night she realizes that the fentaNYL (DURAGESIC) 75 MCG/HR was the 75 mcg instead of the 100 mcg. She stated that she had started on the 100 mg a couple of months ago. Can she get this changed and also since the oxyCODONE-acetaminophen (PERCOCET) 10-325 MG per tablet is on same page, will both need to be reprinted and the other one turned back in.

## 2014-05-01 NOTE — Telephone Encounter (Signed)
Please see below. Is this ok to change?

## 2014-05-01 NOTE — Telephone Encounter (Signed)
She can have the refill on the Fentanyl 100 mcg to change every 3 days, disp #10, need to get the 75 mcg rx back, the percocet can be reprinted or it can just be cut off from the 75 mcg rx.

## 2014-05-01 NOTE — Telephone Encounter (Signed)
rx brought back. Percocet cut off of old rx. New fentanyl patch signed by cody and given to pt.

## 2014-05-04 ENCOUNTER — Ambulatory Visit: Payer: 59

## 2014-05-06 ENCOUNTER — Other Ambulatory Visit: Payer: Self-pay

## 2014-05-06 MED ORDER — ALBUTEROL SULFATE HFA 108 (90 BASE) MCG/ACT IN AERS: 2.0000 | INHALATION_SPRAY | Freq: Four times a day (QID) | RESPIRATORY_TRACT | Status: DC | PRN

## 2014-05-08 ENCOUNTER — Inpatient Hospital Stay: Admission: RE | Admit: 2014-05-08 | Payer: 59 | Source: Ambulatory Visit

## 2014-06-01 ENCOUNTER — Other Ambulatory Visit: Payer: Self-pay | Admitting: Family Medicine

## 2014-06-01 NOTE — Telephone Encounter (Signed)
Requesting refill on fentalnyl 100 mcg now / percocet is not due till 06/11/14. Just requesting to try to get both so she only has to make one trip.

## 2014-06-01 NOTE — Telephone Encounter (Signed)
OK to refill her Fentanyl patch same strength, same sig, same #

## 2014-06-01 NOTE — Telephone Encounter (Signed)
Requesting:fentaNYL (DURAGESIC - DOSED MCG/HR) 100 MCG/HR Contract on file UDS-no Last OV-04/07/2014 Last Refill-05/01/2014  Please Advise

## 2014-06-04 ENCOUNTER — Encounter: Payer: Self-pay | Admitting: Family Medicine

## 2014-06-04 ENCOUNTER — Ambulatory Visit (INDEPENDENT_AMBULATORY_CARE_PROVIDER_SITE_OTHER): Payer: 59 | Admitting: Family Medicine

## 2014-06-04 VITALS — BP 131/85 | HR 74 | Temp 98.4°F | Ht 69.0 in | Wt 258.0 lb

## 2014-06-04 DIAGNOSIS — I10 Essential (primary) hypertension: Secondary | ICD-10-CM

## 2014-06-04 DIAGNOSIS — E669 Obesity, unspecified: Secondary | ICD-10-CM

## 2014-06-04 DIAGNOSIS — G894 Chronic pain syndrome: Secondary | ICD-10-CM

## 2014-06-04 DIAGNOSIS — F17200 Nicotine dependence, unspecified, uncomplicated: Secondary | ICD-10-CM

## 2014-06-04 DIAGNOSIS — Z720 Tobacco use: Secondary | ICD-10-CM

## 2014-06-04 DIAGNOSIS — R52 Pain, unspecified: Secondary | ICD-10-CM

## 2014-06-04 DIAGNOSIS — R946 Abnormal results of thyroid function studies: Secondary | ICD-10-CM

## 2014-06-04 DIAGNOSIS — M069 Rheumatoid arthritis, unspecified: Secondary | ICD-10-CM

## 2014-06-04 DIAGNOSIS — R7989 Other specified abnormal findings of blood chemistry: Secondary | ICD-10-CM

## 2014-06-04 MED ORDER — ALBUTEROL SULFATE HFA 108 (90 BASE) MCG/ACT IN AERS: 2.0000 | INHALATION_SPRAY | Freq: Four times a day (QID) | RESPIRATORY_TRACT | Status: DC | PRN

## 2014-06-04 MED ORDER — FENTANYL 100 MCG/HR TD PT72: 100.0000 ug | MEDICATED_PATCH | TRANSDERMAL | Status: DC

## 2014-06-04 MED ORDER — PREDNISONE 5 MG PO TABS: 5.0000 mg | ORAL_TABLET | Freq: Every day | ORAL | Status: DC

## 2014-06-04 MED ORDER — OXYCODONE-ACETAMINOPHEN 10-325 MG PO TABS: 2.0000 | ORAL_TABLET | ORAL | Status: DC | PRN

## 2014-06-04 NOTE — Progress Notes (Signed)
Pre visit review using our clinic review tool, if applicable. No additional management support is needed unless otherwise documented below in the visit note. 

## 2014-06-04 NOTE — Patient Instructions (Signed)

## 2014-06-04 NOTE — Telephone Encounter (Signed)
Requesting Percocet 10/325mg -Take 2 tablets by mouth every 4 hours as needed for pain. Last refill:04/30/14;180,0 Last OV:04/07/14 No UDS Please advise.//AB/[facility]

## 2014-06-04 NOTE — Telephone Encounter (Signed)
OK to refill her Percocet with same sig, same number but needs UDS

## 2014-06-05 LAB — COMPREHENSIVE METABOLIC PANEL
ALBUMIN: 3.8 g/dL (ref 3.5–5.2)
ALT: 18 U/L (ref 0–35)
AST: 15 U/L (ref 0–37)
Alkaline Phosphatase: 108 U/L (ref 39–117)
BUN: 9 mg/dL (ref 6–23)
CO2: 29 meq/L (ref 19–32)
CREATININE: 0.66 mg/dL (ref 0.40–1.20)
Calcium: 9.3 mg/dL (ref 8.4–10.5)
Chloride: 102 mEq/L (ref 96–112)
GFR: 98.93 mL/min (ref >60.00)
GLUCOSE: 118 mg/dL — AB (ref 70–99)
Potassium: 4.2 mEq/L (ref 3.5–5.1)
Sodium: 137 mEq/L (ref 135–145)
Total Bilirubin: 0.4 mg/dL (ref 0.2–1.2)
Total Protein: 8 g/dL (ref 6.0–8.3)

## 2014-06-05 LAB — CBC
HCT: 42.8 % (ref 36.0–46.0)
Hemoglobin: 14.1 g/dL (ref 12.0–15.0)
MCHC: 33 g/dL (ref 30.0–36.0)
MCV: 89 fl (ref 78.0–100.0)
Platelets: 306 K/uL (ref 150.0–400.0)
RBC: 4.81 Mil/uL (ref 3.87–5.11)
RDW: 14.3 % (ref 11.5–15.5)
WBC: 11.9 K/uL — ABNORMAL HIGH (ref 4.0–10.5)

## 2014-06-05 LAB — T4, FREE: Free T4: 1.13 ng/dL (ref 0.60–1.60)

## 2014-06-05 LAB — TSH: TSH: 2.12 u[IU]/mL (ref 0.35–4.50)

## 2014-06-05 NOTE — Telephone Encounter (Signed)
Pharmacy is calling needing clarification of the dosage /direction of the rx fentanyl  Please Nana -[phone].

## 2014-06-05 NOTE — Telephone Encounter (Signed)
Please advise on dose and direction of Fentanyl.      KP

## 2014-06-07 ENCOUNTER — Encounter: Payer: Self-pay | Admitting: Family Medicine

## 2014-06-07 NOTE — Progress Notes (Signed)
Jennifer Kelly  [id number] May 07, 1961 06/07/2014      Progress Note-Follow Up  Subjective  Chief Complaint  Chief Complaint  Patient presents with  . Follow-up    2 mos    HPI  Patient is a 54 y.o. female in today for routine medical care. Patient is in today for follow-up. Continues to struggle with chronic pain diffusely but has had a decent response to fentanyl. Pain control does wear off a bit on the last day. No trouble with the medication. Denies constipation, nausea, somnolence, falls. Continues to struggle with costochondritis and discomfort in her chest but has been using NSAIDs with good results there. No other recent illness. Denies CP/palp/SOB/HA/congestion/fevers/GI or GU c/o. Taking meds as prescribed  Past Medical History  Diagnosis Date  . Arthritis   . Hypertension   . Shingles 2014  . Chicken pox as a child  . Mumps as a child  . History of chicken pox   . H/O mumps   . Acute bronchitis 01/05/2014    Past Surgical History  Procedure Laterality Date  . Tooth extraction  05/2012  . Wisdom tooth extraction  54 yrs old  . Back surgery  2003  . Rotater cuff  20 yrs ago    left shoulder    Family History  Problem Relation Age of Onset  . Parkinson's disease Mother   . Heart disease Mother   . Hypertension Mother   . Diabetes Father     type 2  . Hypertension Father   . Stroke Father   . Vascular Disease Brother   . Hypertension Brother   . Diabetes Brother   . Heart attack Paternal Uncle     History   Social History  . Marital Status: Married    Spouse Name: N/A    Number of Children: N/A  . Years of Education: N/A   Occupational History  . Not on file.   Social History Main Topics  . Smoking status: Current Every Day Smoker  . Smokeless tobacco: Not on file  . Alcohol Use: Yes     Comment: occ wine  . Drug Use: No  . Sexual Activity: No     Comment: no dietary restrictions. lives alone   Other Topics Concern  . Not on file    Social History Narrative    Current Outpatient Prescriptions on File Prior to Visit  Medication Sig Dispense Refill  . abatacept (ORENCIA) 250 MG injection Inject into the vein once a week.    . beclomethasone (QVAR) 40 MCG/ACT inhaler Inhale 2 puffs into the lungs 2 (two) times daily at 10 AM and 5 PM. 1 Inhaler 2  . citalopram (CELEXA) 20 MG tablet take 1 tablet by mouth once daily 30 tablet 10  . diltiazem (CARDIZEM CD) 240 MG 24 hr capsule Take 1 capsule (240 mg total) by mouth daily. 30 capsule 11  . gabapentin (NEURONTIN) 600 MG tablet take 1 tablet by mouth three times a day 90 tablet 4  . ibuprofen (ADVIL,MOTRIN) 800 MG tablet     . losartan-hydrochlorothiazide (HYZAAR) 50-12.5 MG per tablet Take 1 tablet by mouth daily. 30 tablet 11   No current facility-administered medications on file prior to visit.    No Known Allergies  Review of Systems  Review of Systems  Constitutional: Positive for malaise/fatigue. Negative for fever.  HENT: Negative for congestion.   Eyes: Negative for discharge.  Respiratory: Negative for shortness of breath.   Cardiovascular: Negative for  chest pain, palpitations and leg swelling.  Gastrointestinal: Negative for nausea, abdominal pain and diarrhea.  Genitourinary: Negative for dysuria.  Musculoskeletal: Positive for myalgias, back pain, joint pain and neck pain. Negative for falls.  Skin: Negative for rash.  Neurological: Negative for loss of consciousness and headaches.  Endo/Heme/Allergies: Negative for polydipsia.  Psychiatric/Behavioral: Negative for suicidal ideas. The patient is nervous/anxious. The patient does not have insomnia.     Objective  BP 131/85 mmHg  Pulse 74  Temp(Src) 98.4 F (36.9 C) (Oral)  Ht 5\' 9"  (1.753 m)  Wt 258 lb (117.028 kg)  BMI 38.08 kg/m2  SpO2 92%  Physical Exam  Physical Exam  Constitutional: She is oriented to person, place, and time and well-developed, well-nourished, and in no distress. No  distress.  HENT:  Head: Normocephalic and atraumatic.  Eyes: Conjunctivae are normal.  Neck: Neck supple. No thyromegaly present.  Cardiovascular: Normal rate, regular rhythm and normal heart sounds.   No murmur heard. Pulmonary/Chest: Effort normal and breath sounds normal. She has no wheezes.  Abdominal: She exhibits no distension and no mass.  Musculoskeletal: She exhibits no edema.  Lymphadenopathy:    She has no cervical adenopathy.  Neurological: She is alert and oriented to person, place, and time.  Skin: Skin is warm and dry. No rash noted. She is not diaphoretic.  Psychiatric: Memory, affect and judgment normal.    Lab Results  Component Value Date   TSH 2.12 06/04/2014   Lab Results  Component Value Date   WBC 11.9* 06/04/2014   HGB 14.1 06/04/2014   HCT 42.8 06/04/2014   MCV 89.0 06/04/2014   PLT 306.0 06/04/2014   Lab Results  Component Value Date   CREATININE 0.66 06/04/2014   BUN 9 06/04/2014   NA 137 06/04/2014   K 4.2 06/04/2014   CL 102 06/04/2014   CO2 29 06/04/2014   Lab Results  Component Value Date   ALT 18 06/04/2014   AST 15 06/04/2014   ALKPHOS 108 06/04/2014   BILITOT 0.4 06/04/2014   Lab Results  Component Value Date   CHOL 180 02/17/2014   Lab Results  Component Value Date   HDL 78.60 02/17/2014   Lab Results  Component Value Date   LDLCALC 79 02/17/2014   Lab Results  Component Value Date   TRIG 112.0 02/17/2014   Lab Results  Component Value Date   CHOLHDL 2 02/17/2014     Assessment & Plan  Hypertension Well controlled, no changes to meds. Encouraged heart healthy diet such as the DASH diet and exercise as tolerated.    Chronic pain syndrome Improved pain control on Fentanyl patch but wears off after 2 days will try changing at 48 hours.    Obesity Encouraged DASH diet, decrease po intake and increase exercise as tolerated. Needs 7-8 hours of sleep nightly. Avoid trans fats, eat small, frequent meals every 4-5  hours with lean proteins, complex carbs and healthy fats. Minimize simple carbs   TOBACCO ABUSE Encouraged complete cessation. Discussed need to quit as relates to risk of numerous cancers, cardiac and pulmonary disease as well as neurologic complications. Counseled for greater than 3 minutes

## 2014-06-07 NOTE — Assessment & Plan Note (Signed)
Well controlled, no changes to meds. Encouraged heart healthy diet such as the DASH diet and exercise as tolerated.  °

## 2014-06-07 NOTE — Telephone Encounter (Signed)
I have changed the prescription so she can change the patch every 48 hours instead of every 72 and thus allowed her more patches.

## 2014-06-07 NOTE — Assessment & Plan Note (Signed)
Encouraged DASH diet, decrease po intake and increase exercise as tolerated. Needs 7-8 hours of sleep nightly. Avoid trans fats, eat small, frequent meals every 4-5 hours with lean proteins, complex carbs and healthy fats. Minimize simple carbs 

## 2014-06-07 NOTE — Assessment & Plan Note (Signed)
Encouraged complete cessation. Discussed need to quit as relates to risk of numerous cancers, cardiac and pulmonary disease as well as neurologic complications. Counseled for greater than 3 minutes 

## 2014-06-07 NOTE — Assessment & Plan Note (Signed)
Improved pain control on Fentanyl patch but wears off after 2 days will try changing at 48 hours.

## 2014-06-08 NOTE — Telephone Encounter (Signed)
Pharmacy filled Rx with new orders.

## 2014-06-10 ENCOUNTER — Other Ambulatory Visit: Payer: Self-pay | Admitting: Family Medicine

## 2014-06-11 NOTE — Telephone Encounter (Signed)
Last filled:  06/09/13 Amt:  30, 11 refills Last OV:  06/04/14  Med filled.

## 2014-06-18 ENCOUNTER — Other Ambulatory Visit: Payer: Self-pay | Admitting: Family Medicine

## 2014-07-07 ENCOUNTER — Telehealth: Payer: Self-pay | Admitting: Family Medicine

## 2014-07-07 DIAGNOSIS — R52 Pain, unspecified: Secondary | ICD-10-CM

## 2014-07-07 DIAGNOSIS — M069 Rheumatoid arthritis, unspecified: Secondary | ICD-10-CM

## 2014-07-07 NOTE — Telephone Encounter (Signed)
Caller name: Justeen Relation to pt: self Call back number: [phone] Pharmacy:  Reason for call:   Patient requesting a new rx of fentynol patches and oxycodone. Patient is out of the patches.

## 2014-07-07 NOTE — Telephone Encounter (Signed)
She can have a refill on both pain meds, same sig, same number please see if some one maybe Melissa will sign these for me did not see these til after hours.

## 2014-07-08 NOTE — Telephone Encounter (Signed)
Requesting:fentaNYL (DURAGESIC - DOSED MCG/HR) 100 MCG/HR oxyCODONE-acetaminophen (PERCOCET) 10-325 MG per tablet Contract on file UDS: 06/04/2014 Moderate Risk Last OV 06/04/2014 Last Refill Fentanyl 06/04/2014 #14 no refills Percocet 06/04/2014 #180 no refills  Please Advise

## 2014-07-08 NOTE — Telephone Encounter (Signed)
Caller name: Vianne, Grieshop Relation to pt: self  Call back number: [phone] Pharmacy: [facility], [place] [phone] (Phone) [phone] (Fax)         Reason for call:  pt checking on the status of fentaNYL (DURAGESIC - DOSED MCG/HR) 100 MCG/HR and requesting a refill of  oxyCODONE-acetaminophen (PERCOCET) 10-325 MG per tablet pt is aware she is not due until the end of the week but wanted to pick up RX.

## 2014-07-08 NOTE — Telephone Encounter (Signed)
Caller name: Brylan, Seubert Relation to pt: self  Call back number: [phone]   Reason for call:  Pt checking on the status of RX, states she will be in the [place] area and would like to pick up both  RX today due to the fact she is completly out of patches.

## 2014-07-08 NOTE — Telephone Encounter (Signed)
I sent a note on 07/07/14 saying she could have a refill on both meds and to see if someone could sign it for me. It looks like that did not happen? If not please print and I will sign

## 2014-07-09 MED ORDER — OXYCODONE-ACETAMINOPHEN 10-325 MG PO TABS: 2.0000 | ORAL_TABLET | ORAL | Status: DC | PRN

## 2014-07-09 MED ORDER — FENTANYL 100 MCG/HR TD PT72: 100.0000 ug | MEDICATED_PATCH | TRANSDERMAL | Status: DC

## 2014-07-09 NOTE — Telephone Encounter (Signed)
Printed both prescriptions put on counter for PCP to sign.

## 2014-07-09 NOTE — Telephone Encounter (Signed)
Called the patient informed to pickup hardcopy of both percocet and fentanyl patch prescription.

## 2014-07-29 ENCOUNTER — Other Ambulatory Visit: Payer: Self-pay

## 2014-07-29 MED ORDER — BECLOMETHASONE DIPROPIONATE 40 MCG/ACT IN AERS: 2.0000 | INHALATION_SPRAY | Freq: Two times a day (BID) | RESPIRATORY_TRACT | Status: DC

## 2014-07-30 ENCOUNTER — Encounter: Payer: Self-pay | Admitting: Medical

## 2014-07-30 ENCOUNTER — Ambulatory Visit (INDEPENDENT_AMBULATORY_CARE_PROVIDER_SITE_OTHER): Payer: 59 | Admitting: Medical

## 2014-07-30 VITALS — BP 130/80 | HR 76 | Temp 98.1°F | Ht 71.5 in | Wt 250.0 lb

## 2014-07-30 DIAGNOSIS — Z4802 Encounter for removal of sutures: Secondary | ICD-10-CM

## 2014-07-30 MED ORDER — DOXYCYCLINE HYCLATE 100 MG PO TABS: 100.0000 mg | ORAL_TABLET | Freq: Two times a day (BID) | ORAL | Status: DC

## 2014-07-30 NOTE — Progress Notes (Signed)
Subjective:    Patient ID: Jennifer Kelly, female    DOB: 1961-01-20, 54 y.o.   MRN: [id number]  HPI  Pt got laceration rt side of pretibial are about 12 days ago. She cut the area on christmas ornament. Placed in the ED. No dc since sutres have been place. Told to come in for 12 days for removal of sutures.   Review of Systems  Constitutional: Negative for fever, chills and fatigue.  Skin:       Rt pretibial area laceration. Sutures in place.   Past Medical History  Diagnosis Date  . Arthritis   . Hypertension   . Shingles 2014  . Chicken pox as a child  . Mumps as a child  . History of chicken pox   . H/O mumps   . Acute bronchitis 01/05/2014    History   Social History  . Marital Status: Married    Spouse Name: N/A  . Number of Children: N/A  . Years of Education: N/A   Occupational History  . Not on file.   Social History Main Topics  . Smoking status: Current Every Day Smoker  . Smokeless tobacco: Not on file  . Alcohol Use: Yes     Comment: occ wine  . Drug Use: No  . Sexual Activity: No     Comment: no dietary restrictions. lives alone   Other Topics Concern  . Not on file   Social History Narrative    Past Surgical History  Procedure Laterality Date  . Tooth extraction  05/2012  . Wisdom tooth extraction  54 yrs old  . Back surgery  2003  . Rotater cuff  20 yrs ago    left shoulder    Family History  Problem Relation Age of Onset  . Parkinson's disease Mother   . Heart disease Mother   . Hypertension Mother   . Diabetes Father     type 2  . Hypertension Father   . Stroke Father   . Vascular Disease Brother   . Hypertension Brother   . Diabetes Brother   . Heart attack Paternal Uncle     No Known Allergies  Current Outpatient Prescriptions on File Prior to Visit  Medication Sig Dispense Refill  . abatacept (ORENCIA) 250 MG injection Inject into the vein once a week.    Marland Kitchen albuterol (PROVENTIL HFA;VENTOLIN HFA) 108 (90 BASE)  MCG/ACT inhaler Inhale 2 puffs into the lungs every 6 (six) hours as needed for wheezing or shortness of breath. 1 Inhaler 1  . beclomethasone (QVAR) 40 MCG/ACT inhaler Inhale 2 puffs into the lungs 2 (two) times daily at 10 AM and 5 PM. 1 Inhaler 2  . citalopram (CELEXA) 20 MG tablet take 1 tablet by mouth once daily 30 tablet 10  . diltiazem (CARDIZEM CD) 240 MG 24 hr capsule take 1 capsule by mouth once daily 30 capsule 11  . fentaNYL (DURAGESIC - DOSED MCG/HR) 100 MCG/HR Place 1 patch (100 mcg total) onto the skin every other day. 15 patch 0  . gabapentin (NEURONTIN) 600 MG tablet take 1 tablet by mouth three times a day 90 tablet 4  . ibuprofen (ADVIL,MOTRIN) 800 MG tablet     . losartan-hydrochlorothiazide (HYZAAR) 50-12.5 MG per tablet take 1 tablet by mouth once daily 30 tablet 5  . oxyCODONE-acetaminophen (PERCOCET) 10-325 MG per tablet Take 2 tablets by mouth every 4 (four) hours as needed for pain. Can fill on 06/11/2014 180 tablet 0  .  predniSONE (DELTASONE) 5 MG tablet Take 1 tablet (5 mg total) by mouth daily with breakfast.     No current facility-administered medications on file prior to visit.    BP 144/110 mmHg  Pulse 76  Temp(Src) 98.1 F (36.7 C) (Oral)  Ht 5' 11.5" (1.816 m)  Wt 250 lb (113.399 kg)  BMI 34.39 kg/m2  SpO2 90%      Objective:   Physical Exam  General - no acute distress Skin- v shaped laceration total length approx 2.0 cm. Faint minimal pink and tender around suture site post removal.        Assessment & Plan:

## 2014-07-30 NOTE — Patient Instructions (Signed)
Visit for suture removal Area looks well healed. Mild pink/red around sutures. No infection but irritation. Will remove sutures today.  Pt states mild tender at times. She orencia for RA.  I think no antibiotic needed.  But if she has persisting redness after suture removal or expanding redness then take doxycycline   Follow up in 7 days any persisting symptoms or as needed.

## 2014-07-30 NOTE — Assessment & Plan Note (Addendum)
Area looks well healed. Mild pink/red around sutures. No infection but irritation. Will remove sutures today.  Pt states mild tender at times. She orencia for RA.  I think no antibiotic needed.  But if she has persisting redness after suture removal or expanding redness then take doxycycline

## 2014-07-30 NOTE — Progress Notes (Signed)
Pre visit review using our clinic review tool, if applicable. No additional management support is needed unless otherwise documented below in the visit note. 

## 2014-08-04 ENCOUNTER — Other Ambulatory Visit: Payer: Self-pay | Admitting: Family Medicine

## 2014-08-04 NOTE — Telephone Encounter (Signed)
Caller name: Tyshia Relation to pt: self Call back number: [phone] Pharmacy:  Reason for call:   Requesting fentanyl patch and percocet refill

## 2014-08-05 ENCOUNTER — Other Ambulatory Visit: Payer: Self-pay | Admitting: Family Medicine

## 2014-08-05 DIAGNOSIS — M069 Rheumatoid arthritis, unspecified: Secondary | ICD-10-CM

## 2014-08-05 DIAGNOSIS — R52 Pain, unspecified: Secondary | ICD-10-CM

## 2014-08-05 MED ORDER — OXYCODONE-ACETAMINOPHEN 10-325 MG PO TABS: 2.0000 | ORAL_TABLET | ORAL | Status: DC | PRN

## 2014-08-05 MED ORDER — FENTANYL 100 MCG/HR TD PT72: 100.0000 ug | MEDICATED_PATCH | TRANSDERMAL | Status: DC

## 2014-08-05 NOTE — Telephone Encounter (Signed)
I have reordered the prescriptions, please place on my desk to sign in am. Thx

## 2014-08-06 NOTE — Telephone Encounter (Signed)
Called the patient on her home number informed to pickup hardcopy at the front desk at her convenience

## 2014-09-04 ENCOUNTER — Ambulatory Visit (INDEPENDENT_AMBULATORY_CARE_PROVIDER_SITE_OTHER): Payer: 59 | Admitting: Family Medicine

## 2014-09-04 ENCOUNTER — Encounter: Payer: Self-pay | Admitting: Family Medicine

## 2014-09-04 VITALS — BP 135/82 | HR 75 | Temp 98.3°F | Resp 18 | Ht 70.0 in | Wt 247.0 lb

## 2014-09-04 DIAGNOSIS — M67472 Ganglion, left ankle and foot: Secondary | ICD-10-CM

## 2014-09-04 DIAGNOSIS — M545 Low back pain: Secondary | ICD-10-CM

## 2014-09-04 DIAGNOSIS — E669 Obesity, unspecified: Secondary | ICD-10-CM | POA: Diagnosis not present

## 2014-09-04 DIAGNOSIS — F17200 Nicotine dependence, unspecified, uncomplicated: Secondary | ICD-10-CM

## 2014-09-04 DIAGNOSIS — M069 Rheumatoid arthritis, unspecified: Secondary | ICD-10-CM | POA: Diagnosis not present

## 2014-09-04 DIAGNOSIS — R52 Pain, unspecified: Secondary | ICD-10-CM

## 2014-09-04 DIAGNOSIS — I10 Essential (primary) hypertension: Secondary | ICD-10-CM

## 2014-09-04 DIAGNOSIS — Z720 Tobacco use: Secondary | ICD-10-CM

## 2014-09-04 DIAGNOSIS — L309 Dermatitis, unspecified: Secondary | ICD-10-CM

## 2014-09-04 MED ORDER — OXYCODONE-ACETAMINOPHEN 10-325 MG PO TABS: 2.0000 | ORAL_TABLET | ORAL | Status: DC | PRN

## 2014-09-04 MED ORDER — GABAPENTIN 600 MG PO TABS: 600.0000 mg | ORAL_TABLET | Freq: Three times a day (TID) | ORAL | Status: DC

## 2014-09-04 MED ORDER — FENTANYL 100 MCG/HR TD PT72: 100.0000 ug | MEDICATED_PATCH | TRANSDERMAL | Status: DC

## 2014-09-04 MED ORDER — CITALOPRAM HYDROBROMIDE 20 MG PO TABS: 20.0000 mg | ORAL_TABLET | Freq: Every day | ORAL | Status: DC

## 2014-09-04 MED ORDER — BECLOMETHASONE DIPROPIONATE 40 MCG/ACT IN AERS: 2.0000 | INHALATION_SPRAY | Freq: Two times a day (BID) | RESPIRATORY_TRACT | Status: DC

## 2014-09-04 MED ORDER — DILTIAZEM HCL ER COATED BEADS 240 MG PO CP24: 240.0000 mg | ORAL_CAPSULE | Freq: Every day | ORAL | Status: DC

## 2014-09-04 MED ORDER — CLOBETASOL PROPIONATE 0.05 % EX OINT: 1.0000 "application " | TOPICAL_OINTMENT | Freq: Two times a day (BID) | CUTANEOUS | Status: DC

## 2014-09-04 MED ORDER — LOSARTAN POTASSIUM-HCTZ 50-12.5 MG PO TABS: 1.0000 | ORAL_TABLET | Freq: Every day | ORAL | Status: DC

## 2014-09-04 MED ORDER — ALBUTEROL SULFATE HFA 108 (90 BASE) MCG/ACT IN AERS: 2.0000 | INHALATION_SPRAY | Freq: Four times a day (QID) | RESPIRATORY_TRACT | Status: DC | PRN

## 2014-09-04 NOTE — Patient Instructions (Signed)
Cetirizine/Zyrtec 10 mg tabs once daily and can increase to twice daily  Zantac/Ranitidine 150 mg tab po daily and can increase to twice daily  Salon pas gel and ice to ankle/achilles tendons twice daily  Allergic Rhinitis Allergic rhinitis is when the mucous membranes in the nose respond to allergens. Allergens are particles in the air that cause your body to have an allergic reaction. This causes you to release allergic antibodies. Through a chain of events, these eventually cause you to release histamine into the blood stream. Although meant to protect the body, it is this release of histamine that causes your discomfort, such as frequent sneezing, congestion, and an itchy, runny nose.  CAUSES  Seasonal allergic rhinitis (hay fever) is caused by pollen allergens that may come from grasses, trees, and weeds. Year-round allergic rhinitis (perennial allergic rhinitis) is caused by allergens such as house dust mites, pet dander, and mold spores.  SYMPTOMS   Nasal stuffiness (congestion).  Itchy, runny nose with sneezing and tearing of the eyes. DIAGNOSIS  Your health care provider can help you determine the allergen or allergens that trigger your symptoms. If you and your health care provider are unable to determine the allergen, skin or blood testing may be used. TREATMENT  Allergic rhinitis does not have a cure, but it can be controlled by:  Medicines and allergy shots (immunotherapy).  Avoiding the allergen. Hay fever may often be treated with antihistamines in pill or nasal spray forms. Antihistamines block the effects of histamine. There are over-the-counter medicines that may help with nasal congestion and swelling around the eyes. Check with your health care provider before taking or giving this medicine.  If avoiding the allergen or the medicine prescribed do not work, there are many new medicines your health care provider can prescribe. Stronger medicine may be used if initial  measures are ineffective. Desensitizing injections can be used if medicine and avoidance does not work. Desensitization is when a patient is given ongoing shots until the body becomes less sensitive to the allergen. Make sure you follow up with your health care provider if problems continue. HOME CARE INSTRUCTIONS It is not possible to completely avoid allergens, but you can reduce your symptoms by taking steps to limit your exposure to them. It helps to know exactly what you are allergic to so that you can avoid your specific triggers. SEEK MEDICAL CARE IF:   You have a fever.  You develop a cough that does not stop easily (persistent).  You have shortness of breath.  You start wheezing.  Symptoms interfere with normal daily activities. Document Released: 01/31/2001 Document Revised: 05/13/2013 Document Reviewed: 01/13/2013 [facility] Patient Information 2015 [place], [place]. This information is not intended to replace advice given to you by your health care provider. Make sure you discuss any questions you have with your health care provider.

## 2014-09-04 NOTE — Progress Notes (Signed)
Jennifer Kelly  [id number] 1961-03-14 09/04/2014      Progress Note-Follow Up  Subjective  Chief Complaint  Chief Complaint  Patient presents with  . Follow-up    Nodules on left heel that is painful. Also has had sutures recently and has rashes on leg and hands for 2 wks    HPI  Patient is a 54 y.o. female in today for routine medical care. Patient is in today for follow-up. Continues to struggle with chronic pain both arthralgias, and myalgias no worsening despite her recent injury. She a sharp object in her home and required 5 stitches to her leg. It has healed well. Unfortunately she has developed a pruritic rash on her leg. No warmth, fevers or chills. No recent acute illness. Denies CP/palp/SOB/HA/congestion/fevers/GI or GU c/o. Taking meds as prescribed  Past Medical History  Diagnosis Date  . Arthritis   . Hypertension   . Shingles 2014  . Chicken pox as a child  . Mumps as a child  . History of chicken pox   . H/O mumps   . Acute bronchitis 01/05/2014    Past Surgical History  Procedure Laterality Date  . Tooth extraction  05/2012  . Wisdom tooth extraction  54 yrs old  . Back surgery  2003  . Rotater cuff  20 yrs ago    left shoulder    Family History  Problem Relation Age of Onset  . Parkinson's disease Mother   . Heart disease Mother   . Hypertension Mother   . Diabetes Father     type 2  . Hypertension Father   . Stroke Father   . Vascular Disease Brother   . Hypertension Brother   . Diabetes Brother   . Heart attack Paternal Uncle     History   Social History  . Marital Status: Married    Spouse Name: N/A  . Number of Children: N/A  . Years of Education: N/A   Occupational History  . Not on file.   Social History Main Topics  . Smoking status: Current Every Day Smoker  . Smokeless tobacco: Not on file  . Alcohol Use: Yes     Comment: occ wine  . Drug Use: No  . Sexual Activity: No     Comment: no dietary restrictions. lives alone     Other Topics Concern  . Not on file   Social History Narrative    Current Outpatient Prescriptions on File Prior to Visit  Medication Sig Dispense Refill  . abatacept (ORENCIA) 250 MG injection Inject into the vein once a week.    Marland Kitchen albuterol (PROVENTIL HFA;VENTOLIN HFA) 108 (90 BASE) MCG/ACT inhaler Inhale 2 puffs into the lungs every 6 (six) hours as needed for wheezing or shortness of breath. 1 Inhaler 1  . beclomethasone (QVAR) 40 MCG/ACT inhaler Inhale 2 puffs into the lungs 2 (two) times daily at 10 AM and 5 PM. 1 Inhaler 2  . citalopram (CELEXA) 20 MG tablet take 1 tablet by mouth once daily 30 tablet 10  . diltiazem (CARDIZEM CD) 240 MG 24 hr capsule take 1 capsule by mouth once daily 30 capsule 11  . fentaNYL (DURAGESIC - DOSED MCG/HR) 100 MCG/HR Place 1 patch (100 mcg total) onto the skin every other day. 15 patch 0  . gabapentin (NEURONTIN) 600 MG tablet take 1 tablet by mouth three times a day 90 tablet 4  . ibuprofen (ADVIL,MOTRIN) 800 MG tablet     . losartan-hydrochlorothiazide (HYZAAR) 50-12.5  MG per tablet take 1 tablet by mouth once daily 30 tablet 5  . oxyCODONE-acetaminophen (PERCOCET) 10-325 MG per tablet Take 2 tablets by mouth every 4 (four) hours as needed for pain. Can fill on 06/11/2014 180 tablet 0  . predniSONE (DELTASONE) 5 MG tablet Take 1 tablet (5 mg total) by mouth daily with breakfast.     No current facility-administered medications on file prior to visit.    No Known Allergies  Review of Systems  Review of Systems  Constitutional: Negative for fever and malaise/fatigue.  HENT: Negative for congestion.   Eyes: Negative for discharge.  Respiratory: Negative for shortness of breath.   Cardiovascular: Negative for chest pain, palpitations and leg swelling.  Gastrointestinal: Negative for nausea, abdominal pain and diarrhea.  Genitourinary: Negative for dysuria.  Musculoskeletal: Positive for myalgias, back pain and joint pain. Negative for falls.   Skin: Positive for itching and rash.  Neurological: Negative for loss of consciousness and headaches.  Endo/Heme/Allergies: Negative for polydipsia.  Psychiatric/Behavioral: Negative for depression and suicidal ideas. The patient is not nervous/anxious and does not have insomnia.     Objective  BP 135/82 mmHg  Pulse 75  Temp(Src) 98.3 F (36.8 C) (Oral)  Resp 18  Ht 5\' 10"  (1.778 m)  Wt 247 lb (112.038 kg)  BMI 35.44 kg/m2  SpO2 95%  Physical Exam  Physical Exam  Constitutional: She is oriented to person, place, and time and well-developed, well-nourished, and in no distress. No distress.  HENT:  Head: Normocephalic and atraumatic.  Eyes: Conjunctivae are normal.  Neck: Neck supple. No thyromegaly present.  Cardiovascular: Normal rate, regular rhythm and normal heart sounds.   No murmur heard. Pulmonary/Chest: Effort normal and breath sounds normal. She has no wheezes.  Abdominal: She exhibits no distension and no mass.  Musculoskeletal: She exhibits no edema.  Lymphadenopathy:    She has no cervical adenopathy.  Neurological: She is alert and oriented to person, place, and time.  Skin: Skin is warm and dry. Rash noted. She is not diaphoretic.  Erythematous maculopapular rash b/l lower legs and hands  Psychiatric: Memory, affect and judgment normal.    Lab Results  Component Value Date   TSH 2.12 06/04/2014   Lab Results  Component Value Date   WBC 11.9* 06/04/2014   HGB 14.1 06/04/2014   HCT 42.8 06/04/2014   MCV 89.0 06/04/2014   PLT 306.0 06/04/2014   Lab Results  Component Value Date   CREATININE 0.66 06/04/2014   BUN 9 06/04/2014   NA 137 06/04/2014   K 4.2 06/04/2014   CL 102 06/04/2014   CO2 29 06/04/2014   Lab Results  Component Value Date   ALT 18 06/04/2014   AST 15 06/04/2014   ALKPHOS 108 06/04/2014   BILITOT 0.4 06/04/2014   Lab Results  Component Value Date   CHOL 180 02/17/2014   Lab Results  Component Value Date   HDL 78.60  02/17/2014   Lab Results  Component Value Date   LDLCALC 79 02/17/2014   Lab Results  Component Value Date   TRIG 112.0 02/17/2014   Lab Results  Component Value Date   CHOLHDL 2 02/17/2014     Assessment & Plan  Hypertension Well controlled, no changes to meds. Encouraged heart healthy diet such as the DASH diet and exercise as tolerated.    Obesity Encouraged DASH diet, decrease po intake and increase exercise as tolerated. Needs 7-8 hours of sleep nightly. Avoid trans fats, eat small, frequent  meals every 4-5 hours with lean proteins, complex carbs and healthy fats. Minimize simple carbs   TOBACCO ABUSE Is down to just a couple cig a day and a vaporizer. Quit date in May 3. Encouraged complete cessation. Discussed need to quit as relates to risk of numerous cancers, cardiac and pulmonary disease as well as neurologic complications. Counseled for greater than 3 minutes   LOW BACK PAIN Patient is managing her pain with current regimen no changes today   Dermatitis Legs and hand, given rx for Clobetasol cream to use prn, report if worsens.    Ganglion cyst of left foot Along posterior heel. Encouraged stretching, icing and topical treatment.

## 2014-09-04 NOTE — Progress Notes (Signed)
Pre visit review using our clinic review tool, if applicable. No additional management support is needed unless otherwise documented below in the visit note. 

## 2014-09-09 ENCOUNTER — Encounter: Payer: Self-pay | Admitting: Family Medicine

## 2014-09-09 DIAGNOSIS — L309 Dermatitis, unspecified: Secondary | ICD-10-CM

## 2014-09-09 DIAGNOSIS — M67472 Ganglion, left ankle and foot: Secondary | ICD-10-CM

## 2014-09-09 HISTORY — DX: Ganglion, left ankle and foot: M67.472

## 2014-09-09 HISTORY — DX: Dermatitis, unspecified: L30.9

## 2014-09-09 NOTE — Assessment & Plan Note (Signed)
Is down to just a couple cig a day and a vaporizer. Quit date in May 3. Encouraged complete cessation. Discussed need to quit as relates to risk of numerous cancers, cardiac and pulmonary disease as well as neurologic complications. Counseled for greater than 3 minutes

## 2014-09-09 NOTE — Assessment & Plan Note (Signed)
Along posterior heel. Encouraged stretching, icing and topical treatment.

## 2014-09-09 NOTE — Assessment & Plan Note (Signed)
Encouraged DASH diet, decrease po intake and increase exercise as tolerated. Needs 7-8 hours of sleep nightly. Avoid trans fats, eat small, frequent meals every 4-5 hours with lean proteins, complex carbs and healthy fats. Minimize simple carbs 

## 2014-09-09 NOTE — Assessment & Plan Note (Signed)
Well controlled, no changes to meds. Encouraged heart healthy diet such as the DASH diet and exercise as tolerated.  °

## 2014-09-09 NOTE — Assessment & Plan Note (Signed)
Patient is managing her pain with current regimen no changes today

## 2014-09-09 NOTE — Assessment & Plan Note (Addendum)
Legs and hand, given rx for Clobetasol cream to use prn, report if worsens.

## 2014-09-30 ENCOUNTER — Telehealth: Payer: Self-pay | Admitting: Family Medicine

## 2014-09-30 DIAGNOSIS — M069 Rheumatoid arthritis, unspecified: Secondary | ICD-10-CM

## 2014-09-30 DIAGNOSIS — R52 Pain, unspecified: Secondary | ICD-10-CM

## 2014-09-30 NOTE — Telephone Encounter (Signed)
Relation to pt: self  Call back number: [phone]   Reason for call:  Pt requesting a refill oxyCODONE-acetaminophen (PERCOCET) 10-325 MG per tablet

## 2014-10-01 NOTE — Telephone Encounter (Signed)
She can have refill on Percocet same strength, same sig, same number

## 2014-10-01 NOTE — Telephone Encounter (Signed)
Last refill  #180 with 0 refills on 09/04/14 Last office visit 09/04/14 Next office visit 11/05/14  Advise please on refill of Oxycodone

## 2014-10-02 MED ORDER — OXYCODONE-ACETAMINOPHEN 10-325 MG PO TABS: 2.0000 | ORAL_TABLET | ORAL | Status: DC | PRN

## 2014-10-02 NOTE — Telephone Encounter (Signed)
Script printed and given to [facility] to have Dr. Charlett Blake sign. JG//CMA

## 2014-10-02 NOTE — Telephone Encounter (Signed)
Caller: Delmy Holdren, self Ph#: [phone], cell  Pt will be out of medication today. Would like to get RX this morning please.

## 2014-10-02 NOTE — Telephone Encounter (Signed)
Advised patient Rx ready. Placed at front desk.

## 2014-10-13 ENCOUNTER — Other Ambulatory Visit: Payer: Self-pay | Admitting: Family Medicine

## 2014-10-13 MED ORDER — FENTANYL 100 MCG/HR TD PT72: 100.0000 ug | MEDICATED_PATCH | TRANSDERMAL | Status: DC

## 2014-10-13 NOTE — Telephone Encounter (Signed)
Caller name:Takera Relation to pt: self Call back number:[phone] Pharmacy:  Reason for call:   Requesting fentenyl patch

## 2014-10-13 NOTE — Telephone Encounter (Signed)
Last refill 09/04/14  #15 with 0 refills Last office visit 09/04/14

## 2014-10-26 ENCOUNTER — Other Ambulatory Visit: Payer: Self-pay | Admitting: Family Medicine

## 2014-10-30 ENCOUNTER — Telehealth: Payer: Self-pay | Admitting: Family Medicine

## 2014-10-30 DIAGNOSIS — M069 Rheumatoid arthritis, unspecified: Secondary | ICD-10-CM

## 2014-10-30 DIAGNOSIS — R52 Pain, unspecified: Secondary | ICD-10-CM

## 2014-10-30 MED ORDER — OXYCODONE-ACETAMINOPHEN 10-325 MG PO TABS: 2.0000 | ORAL_TABLET | ORAL | Status: DC | PRN

## 2014-10-30 NOTE — Telephone Encounter (Signed)
Patient informed to pickup hardcopy at the front desk. 

## 2014-10-30 NOTE — Telephone Encounter (Signed)
PRINTED AND ON COUNTER FOR SIGNATURE.

## 2014-10-30 NOTE — Telephone Encounter (Signed)
Requesting:  OXYCODONE Contract  06/03/14 UDS  NO UDS GIVEN Last OV  09/04/14 Last Refill  #180 WITH 0 REFILLS ON 10/02/14  Please Advise

## 2014-10-30 NOTE — Telephone Encounter (Signed)
OK to refill Oxycodone, same sig, same numbers.

## 2014-10-30 NOTE — Telephone Encounter (Signed)
Caller name: Chantay Relation to pt: self Call back number: [phone] Pharmacy:  Reason for call:   Patient is requesting oxycodone rx.

## 2014-11-05 ENCOUNTER — Telehealth: Payer: Self-pay | Admitting: Family Medicine

## 2014-11-05 ENCOUNTER — Ambulatory Visit (INDEPENDENT_AMBULATORY_CARE_PROVIDER_SITE_OTHER): Payer: 59 | Admitting: Family Medicine

## 2014-11-05 ENCOUNTER — Encounter: Payer: Self-pay | Admitting: Family Medicine

## 2014-11-05 VITALS — BP 122/80 | HR 74 | Temp 98.2°F | Ht 72.0 in | Wt 251.1 lb

## 2014-11-05 DIAGNOSIS — R739 Hyperglycemia, unspecified: Secondary | ICD-10-CM | POA: Diagnosis not present

## 2014-11-05 DIAGNOSIS — F17200 Nicotine dependence, unspecified, uncomplicated: Secondary | ICD-10-CM

## 2014-11-05 DIAGNOSIS — M545 Low back pain: Secondary | ICD-10-CM

## 2014-11-05 DIAGNOSIS — I10 Essential (primary) hypertension: Secondary | ICD-10-CM

## 2014-11-05 DIAGNOSIS — Z720 Tobacco use: Secondary | ICD-10-CM | POA: Diagnosis not present

## 2014-11-05 DIAGNOSIS — E669 Obesity, unspecified: Secondary | ICD-10-CM

## 2014-11-05 MED ORDER — ALBUTEROL SULFATE HFA 108 (90 BASE) MCG/ACT IN AERS: INHALATION_SPRAY | RESPIRATORY_TRACT | Status: DC

## 2014-11-05 NOTE — Progress Notes (Signed)
Pre visit review using our clinic review tool, if applicable. No additional management support is needed unless otherwise documented below in the visit note. 

## 2014-11-05 NOTE — Telephone Encounter (Signed)
Pt wasn't sure about appts to be scheduled. She has been coming every 2-3 months for follow up due to RXs for controlled substances. Pt req 2 month and 4 month follow up appts and fasting lab appt. She is scheduled for 01/05/15 for a 2 month follow up, 03/01/15 for fasting labs, and 03/08/15 for a 4 month follow up. Per history pt will be due for a CPE in November but it has not been scheduled. Please advise if above are ok or if you would like me to make adjustments to scheduled appts.

## 2014-11-05 NOTE — Telephone Encounter (Signed)
OK to keep the August to September visit and then move the next appt out to November after the annual mark and make that an annual with labs prior to that visit

## 2014-11-05 NOTE — Patient Instructions (Signed)

## 2014-11-06 NOTE — Telephone Encounter (Signed)
Left msg for pt to call and reschedule appts.

## 2014-11-08 ENCOUNTER — Encounter: Payer: Self-pay | Admitting: Family Medicine

## 2014-11-08 NOTE — Assessment & Plan Note (Signed)
Well controlled, no changes to meds. Encouraged heart healthy diet such as the DASH diet and exercise as tolerated.  °

## 2014-11-08 NOTE — Progress Notes (Signed)
Jennifer Kelly  [id number] 1961-03-15 11/08/2014      Progress Note-Follow Up  Subjective  Chief Complaint  Chief Complaint  Patient presents with  . Follow-up    HPI  Patient is a 54 y.o. female in today for routine medical care. Patient is in today for follow-up. Is generally doing well though she is noting some trouble with dermatitis and dry patches on her hands. She is concerned because her rheumatologist Dr. Ouida Sills is leaving. She's had ongoing trouble with pain in her back and neck as well as joint pain most notably in her hands and feet. She has had trouble with a rash on her lower legs and some intermittent pedal edema as well. No fevers or recent illness. Denies CP/palp/SOB/HA/congestion/fevers/GI or GU c/o. Taking meds as prescribed  Past Medical History  Diagnosis Date  . Arthritis   . Hypertension   . Shingles 2014  . Chicken pox as a child  . Mumps as a child  . History of chicken pox   . H/O mumps   . Acute bronchitis 01/05/2014  . Dermatitis 09/09/2014  . Ganglion cyst of left foot 09/09/2014    Past Surgical History  Procedure Laterality Date  . Tooth extraction  05/2012  . Wisdom tooth extraction  54 yrs old  . Back surgery  2003  . Rotater cuff  20 yrs ago    left shoulder    Family History  Problem Relation Age of Onset  . Parkinson's disease Mother   . Heart disease Mother   . Hypertension Mother   . Diabetes Father     type 2  . Hypertension Father   . Stroke Father   . Vascular Disease Brother   . Hypertension Brother   . Diabetes Brother   . Heart attack Paternal Uncle     History   Social History  . Marital Status: Married    Spouse Name: N/A  . Number of Children: N/A  . Years of Education: N/A   Occupational History  . Not on file.   Social History Main Topics  . Smoking status: Current Every Day Smoker  . Smokeless tobacco: Not on file  . Alcohol Use: Yes     Comment: occ wine  . Drug Use: No  . Sexual Activity: No       Comment: no dietary restrictions. lives alone   Other Topics Concern  . Not on file   Social History Narrative    Current Outpatient Prescriptions on File Prior to Visit  Medication Sig Dispense Refill  . abatacept (ORENCIA) 250 MG injection Inject into the vein once a week.    . beclomethasone (QVAR) 40 MCG/ACT inhaler Inhale 2 puffs into the lungs 2 (two) times daily at 10 AM and 5 PM. 1 Inhaler 2  . citalopram (CELEXA) 20 MG tablet Take 1 tablet (20 mg total) by mouth daily. 30 tablet 11  . diltiazem (CARDIZEM CD) 240 MG 24 hr capsule Take 1 capsule (240 mg total) by mouth daily. 30 capsule 11  . fentaNYL (DURAGESIC - DOSED MCG/HR) 100 MCG/HR Place 1 patch (100 mcg total) onto the skin every other day. 15 patch 0  . gabapentin (NEURONTIN) 600 MG tablet Take 1 tablet (600 mg total) by mouth 3 (three) times daily. 90 tablet 4  . ibuprofen (ADVIL,MOTRIN) 800 MG tablet     . losartan-hydrochlorothiazide (HYZAAR) 50-12.5 MG per tablet Take 1 tablet by mouth daily. 30 tablet 5  . oxyCODONE-acetaminophen (PERCOCET) 10-325  MG per tablet Take 2 tablets by mouth every 4 (four) hours as needed for pain. 180 tablet 0  . predniSONE (DELTASONE) 5 MG tablet Take 1 tablet (5 mg total) by mouth daily with breakfast.     No current facility-administered medications on file prior to visit.    No Known Allergies  Review of Systems  Review of Systems  Constitutional: Negative for fever, chills and malaise/fatigue.  HENT: Negative for congestion, hearing loss and nosebleeds.   Eyes: Negative for discharge.  Respiratory: Negative for cough, sputum production, shortness of breath and wheezing.   Cardiovascular: Positive for leg swelling. Negative for chest pain and palpitations.  Gastrointestinal: Negative for heartburn, nausea, vomiting, abdominal pain, diarrhea, constipation and blood in stool.  Genitourinary: Negative for dysuria, urgency, frequency and hematuria.  Musculoskeletal: Positive  for myalgias, back pain and neck pain. Negative for falls.  Skin: Negative for rash.  Neurological: Negative for dizziness, tremors, sensory change, focal weakness, loss of consciousness, weakness and headaches.  Endo/Heme/Allergies: Negative for polydipsia. Does not bruise/bleed easily.  Psychiatric/Behavioral: Negative for depression and suicidal ideas. The patient is not nervous/anxious and does not have insomnia.     Objective  BP 122/80 mmHg  Pulse 74  Temp(Src) 98.2 F (36.8 C) (Oral)  Ht 6' (1.829 m)  Wt 251 lb 2 oz (113.91 kg)  BMI 34.05 kg/m2  SpO2 94%  Physical Exam  Physical Exam  Constitutional: She is oriented to person, place, and time and well-developed, well-nourished, and in no distress. No distress.  HENT:  Head: Normocephalic and atraumatic.  Eyes: Conjunctivae are normal.  Neck: Neck supple. No thyromegaly present.  Cardiovascular: Normal rate, regular rhythm and normal heart sounds.   No murmur heard. Pulmonary/Chest: Effort normal and breath sounds normal. She has no wheezes.  Abdominal: She exhibits no distension and no mass.  Musculoskeletal: She exhibits no edema.  Lymphadenopathy:    She has no cervical adenopathy.  Neurological: She is alert and oriented to person, place, and time.  Skin: Skin is warm and dry. No rash noted. She is not diaphoretic.  Psychiatric: Memory, affect and judgment normal.    Lab Results  Component Value Date   TSH 2.12 06/04/2014   Lab Results  Component Value Date   WBC 11.9* 06/04/2014   HGB 14.1 06/04/2014   HCT 42.8 06/04/2014   MCV 89.0 06/04/2014   PLT 306.0 06/04/2014   Lab Results  Component Value Date   CREATININE 0.66 06/04/2014   BUN 9 06/04/2014   NA 137 06/04/2014   K 4.2 06/04/2014   CL 102 06/04/2014   CO2 29 06/04/2014   Lab Results  Component Value Date   ALT 18 06/04/2014   AST 15 06/04/2014   ALKPHOS 108 06/04/2014   BILITOT 0.4 06/04/2014   Lab Results  Component Value Date    CHOL 180 02/17/2014   Lab Results  Component Value Date   HDL 78.60 02/17/2014   Lab Results  Component Value Date   LDLCALC 79 02/17/2014   Lab Results  Component Value Date   TRIG 112.0 02/17/2014   Lab Results  Component Value Date   CHOLHDL 2 02/17/2014     Assessment & Plan  Hypertension Well controlled, no changes to meds. Encouraged heart healthy diet such as the DASH diet and exercise as tolerated.   Obesity Encouraged DASH diet, decrease po intake and increase exercise as tolerated. Needs 7-8 hours of sleep nightly. Avoid trans fats, eat small, frequent meals every  4-5 hours with lean proteins, complex carbs and healthy fats. Minimize simple carbs, GMO foods.  LOW BACK PAIN May continue current meds at this time, encouraged to stay as active as tolerated  TOBACCO ABUSE Encouraged complete cessation. Discussed need to quit as relates to risk of numerous cancers, cardiac and pulmonary disease as well as neurologic complications. Counseled for greater than 3 minutes

## 2014-11-08 NOTE — Assessment & Plan Note (Signed)
Encouraged DASH diet, decrease po intake and increase exercise as tolerated. Needs 7-8 hours of sleep nightly. Avoid trans fats, eat small, frequent meals every 4-5 hours with lean proteins, complex carbs and healthy fats. Minimize simple carbs, GMO foods. 

## 2014-11-08 NOTE — Assessment & Plan Note (Signed)
May continue current meds at this time, encouraged to stay as active as tolerated

## 2014-11-08 NOTE — Assessment & Plan Note (Signed)
Encouraged complete cessation. Discussed need to quit as relates to risk of numerous cancers, cardiac and pulmonary disease as well as neurologic complications. Counseled for greater than 3 minutes 

## 2014-11-10 NOTE — Telephone Encounter (Signed)
Talked to pt. She will call back to reschedule Oct appts to Nov/Dec for CPE & fasting labs prior.

## 2014-11-18 ENCOUNTER — Telehealth: Payer: Self-pay | Admitting: Family Medicine

## 2014-11-18 DIAGNOSIS — R52 Pain, unspecified: Secondary | ICD-10-CM

## 2014-11-18 DIAGNOSIS — M069 Rheumatoid arthritis, unspecified: Secondary | ICD-10-CM

## 2014-11-18 NOTE — Telephone Encounter (Signed)
Relation to pt: self Call back number: [phone]   Reason for call:  Pt requesting a refill fentaNYL (DURAGESIC - DOSED MCG/HR) 100 MCG/HR and requesting to pick up oxyCODONE-acetaminophen (PERCOCET) 10-325 MG per tablet the same day.

## 2014-11-19 MED ORDER — OXYCODONE-ACETAMINOPHEN 10-325 MG PO TABS: 2.0000 | ORAL_TABLET | ORAL | Status: DC | PRN

## 2014-11-19 MED ORDER — FENTANYL 100 MCG/HR TD PT72: 100.0000 ug | MEDICATED_PATCH | TRANSDERMAL | Status: DC

## 2014-11-19 NOTE — Telephone Encounter (Signed)
She can have her refills for both but needs her UDS when she picks it up

## 2014-11-19 NOTE — Telephone Encounter (Signed)
Printed both prescriptions and on counter for signature.  Patient notified hardcopy at the front desk, BUT must do UDS before taking hardcopy.  Patient did verbalize understanding of instructions.

## 2014-11-19 NOTE — Telephone Encounter (Signed)
Requesting:  Oxycodone and Fentanyl Contract signed on 06/03/14 UDS  none Last OV  11/05/14 Last Refill  Fentanyl  #15 with 0 refills on 10/13/14                   Oxycodone  #180 with 0 refills on 10/30/14  Please Advise

## 2014-11-26 ENCOUNTER — Encounter (HOSPITAL_BASED_OUTPATIENT_CLINIC_OR_DEPARTMENT_OTHER): Payer: Self-pay

## 2014-11-26 ENCOUNTER — Ambulatory Visit (INDEPENDENT_AMBULATORY_CARE_PROVIDER_SITE_OTHER): Payer: 59 | Admitting: Medical

## 2014-11-26 ENCOUNTER — Emergency Department (HOSPITAL_BASED_OUTPATIENT_CLINIC_OR_DEPARTMENT_OTHER): Payer: 59

## 2014-11-26 ENCOUNTER — Emergency Department (HOSPITAL_BASED_OUTPATIENT_CLINIC_OR_DEPARTMENT_OTHER)
Admission: EM | Admit: 2014-11-26 | Discharge: 2014-11-26 | Disposition: A | Discharge status: Home / Self Care | Payer: 59 | Attending: Emergency Medicine | Admitting: Emergency Medicine

## 2014-11-26 ENCOUNTER — Encounter: Payer: Self-pay | Admitting: Medical

## 2014-11-26 ENCOUNTER — Ambulatory Visit: Payer: 59 | Admitting: Medical

## 2014-11-26 VITALS — BP 134/80 | HR 78 | Temp 98.1°F | Ht 72.0 in | Wt 250.0 lb

## 2014-11-26 DIAGNOSIS — R1031 Right lower quadrant pain: Secondary | ICD-10-CM | POA: Insufficient documentation

## 2014-11-26 DIAGNOSIS — G8929 Other chronic pain: Secondary | ICD-10-CM | POA: Insufficient documentation

## 2014-11-26 DIAGNOSIS — Z7951 Long term (current) use of inhaled steroids: Secondary | ICD-10-CM | POA: Diagnosis not present

## 2014-11-26 DIAGNOSIS — M549 Dorsalgia, unspecified: Secondary | ICD-10-CM | POA: Diagnosis not present

## 2014-11-26 DIAGNOSIS — Z8709 Personal history of other diseases of the respiratory system: Secondary | ICD-10-CM | POA: Insufficient documentation

## 2014-11-26 DIAGNOSIS — R1030 Lower abdominal pain, unspecified: Secondary | ICD-10-CM

## 2014-11-26 DIAGNOSIS — I10 Essential (primary) hypertension: Secondary | ICD-10-CM | POA: Insufficient documentation

## 2014-11-26 DIAGNOSIS — Z720 Tobacco use: Secondary | ICD-10-CM | POA: Insufficient documentation

## 2014-11-26 DIAGNOSIS — Z79899 Other long term (current) drug therapy: Secondary | ICD-10-CM | POA: Insufficient documentation

## 2014-11-26 DIAGNOSIS — Z872 Personal history of diseases of the skin and subcutaneous tissue: Secondary | ICD-10-CM | POA: Diagnosis not present

## 2014-11-26 DIAGNOSIS — M1990 Unspecified osteoarthritis, unspecified site: Secondary | ICD-10-CM | POA: Insufficient documentation

## 2014-11-26 DIAGNOSIS — Z8619 Personal history of other infectious and parasitic diseases: Secondary | ICD-10-CM | POA: Insufficient documentation

## 2014-11-26 LAB — CBC WITH DIFFERENTIAL/PLATELET
BASOS PCT: 0 % (ref 0–1)
Basophils Absolute: 0 10*3/uL (ref 0.0–0.1)
EOS ABS: 0.1 10*3/uL (ref 0.0–0.7)
EOS PCT: 1 % (ref 0–5)
HCT: 43.8 % (ref 36.0–46.0)
Hemoglobin: 14.1 g/dL (ref 12.0–15.0)
Lymphocytes Relative: 23 % (ref 12–46)
Lymphs Abs: 2.2 10*3/uL (ref 0.7–4.0)
MCH: 28.7 pg (ref 26.0–34.0)
MCHC: 32.2 g/dL (ref 30.0–36.0)
MCV: 89 fL (ref 78.0–100.0)
MONO ABS: 0.9 10*3/uL (ref 0.1–1.0)
Monocytes Relative: 10 % (ref 3–12)
Neutro Abs: 6.1 10*3/uL (ref 1.7–7.7)
Neutrophils Relative %: 66 % (ref 43–77)
PLATELETS: 255 10*3/uL (ref 150–400)
RBC: 4.92 MIL/uL (ref 3.87–5.11)
RDW: 13.6 % (ref 11.5–15.5)
WBC: 9.4 10*3/uL (ref 4.0–10.5)

## 2014-11-26 LAB — COMPREHENSIVE METABOLIC PANEL
ALT: 23 U/L (ref 14–54)
ANION GAP: 8 (ref 5–15)
AST: 21 U/L (ref 15–41)
Albumin: 3.9 g/dL (ref 3.5–5.0)
Alkaline Phosphatase: 112 U/L (ref 38–126)
BUN: 12 mg/dL (ref 6–20)
CALCIUM: 9.1 mg/dL (ref 8.9–10.3)
CO2: 31 mmol/L (ref 22–32)
CREATININE: 0.68 mg/dL (ref 0.44–1.00)
Chloride: 98 mmol/L — ABNORMAL LOW (ref 101–111)
GLUCOSE: 101 mg/dL — AB (ref 65–99)
Potassium: 3.7 mmol/L (ref 3.5–5.1)
Sodium: 137 mmol/L (ref 135–145)
Total Bilirubin: 0.4 mg/dL (ref 0.3–1.2)
Total Protein: 7.9 g/dL (ref 6.5–8.1)

## 2014-11-26 LAB — URINALYSIS, ROUTINE W REFLEX MICROSCOPIC
Bilirubin Urine: NEGATIVE
Glucose, UA: NEGATIVE mg/dL
Ketones, ur: NEGATIVE mg/dL
LEUKOCYTES UA: NEGATIVE
NITRITE: NEGATIVE
PH: 6.5 (ref 5.0–8.0)
Protein, ur: NEGATIVE mg/dL
Specific Gravity, Urine: 1.004 — ABNORMAL LOW (ref 1.005–1.030)
UROBILINOGEN UA: 0.2 mg/dL (ref 0.0–1.0)

## 2014-11-26 LAB — URINE MICROSCOPIC-ADD ON

## 2014-11-26 LAB — POCT URINALYSIS DIPSTICK
Bilirubin, UA: NEGATIVE
Blood, UA: POSITIVE
GLUCOSE UA: NEGATIVE
KETONES UA: NEGATIVE
Leukocytes, UA: NEGATIVE
Nitrite, UA: NEGATIVE
Protein, UA: NEGATIVE
Urobilinogen, UA: 0.2
pH, UA: 6.5

## 2014-11-26 MED ORDER — IOHEXOL 300 MG/ML  SOLN
100.0000 mL | Freq: Once | INTRAMUSCULAR | Status: AC | PRN
  Administered 2014-11-26: 100 mL via INTRAVENOUS

## 2014-11-26 MED ORDER — SODIUM CHLORIDE 0.9 % IV BOLUS (SEPSIS)
1000.0000 mL | Freq: Once | INTRAVENOUS | Status: AC
  Administered 2014-11-26: 1000 mL via INTRAVENOUS

## 2014-11-26 MED ORDER — HYDROMORPHONE HCL 1 MG/ML IJ SOLN
1.0000 mg | Freq: Once | INTRAMUSCULAR | Status: AC
  Administered 2014-11-26: 1 mg via INTRAVENOUS

## 2014-11-26 MED ORDER — HYDROMORPHONE HCL 1 MG/ML IJ SOLN
1.0000 mg | Freq: Once | INTRAMUSCULAR | Status: AC
  Administered 2014-11-26: 1 mg via INTRAVENOUS
  Filled 2014-11-26: qty 1

## 2014-11-26 MED ORDER — IOHEXOL 300 MG/ML  SOLN
25.0000 mL | Freq: Once | INTRAMUSCULAR | Status: AC | PRN
  Administered 2014-11-26: 25 mL via ORAL

## 2014-11-26 MED ORDER — HYDROMORPHONE HCL 1 MG/ML IJ SOLN
INTRAMUSCULAR | Status: AC
  Filled 2014-11-26: qty 1

## 2014-11-26 NOTE — ED Notes (Signed)
MD at bedside. 

## 2014-11-26 NOTE — Patient Instructions (Signed)
Severe abdomen pain. Level 7-9/10. 9 level pain on walking. Pain directly over appendix/rlq region. Called to ED talked with charge nurse and explained presentation. Advised pt to go do now for work up.  Follow u p here after ED eval.  Asked staff to take pt down in wheel chair since obvious severe pain walking.

## 2014-11-26 NOTE — ED Notes (Signed)
Family at bedside. 

## 2014-11-26 NOTE — ED Notes (Signed)
Pt reports 2-3 days of rlq abd pain, worse with walking, denies n/v/d, dysuria or vaginal discharge.

## 2014-11-26 NOTE — Progress Notes (Signed)
Subjective:    Patient ID: Jennifer Kelly, female    DOB: [birth year], 54 y.o.   MRN: [id number]  HPI  Pt in with some rt lower abdomen pain and groin region pain. Pain 2 days ago. No fall or trauma. Pain is throbbing and high level pain.Pt has oxycodone for other pain regions. She ws using this in order to help with the pain. But took 2 oxycodone at 8 am. And then took 2 at 12 noon. Pain when she walks is level 9/10 pain. When sits is level 7/10 pain. No nausea and no vomiting. Today appeitie decreased. No fever.    Review of Systems  Constitutional: Negative for fever, chills, appetite change and fatigue.  Respiratory: Negative for choking, chest tightness, shortness of breath and wheezing.   Cardiovascular: Negative for chest pain and palpitations.  Gastrointestinal: Positive for abdominal pain. Negative for nausea, vomiting, diarrhea, constipation and abdominal distention.       Rt lower quadrant pain.  Genitourinary: Negative for dysuria, difficulty urinating and pelvic pain.  Musculoskeletal: Negative for back pain.  Neurological: Negative for dizziness and headaches.  Hematological: Negative for adenopathy. Does not bruise/bleed easily.    Past Medical History  Diagnosis Date  . Arthritis   . Hypertension   . Shingles 2014  . Chicken pox as a child  . Mumps as a child  . History of chicken pox   . H/O mumps   . Acute bronchitis 01/05/2014  . Dermatitis 09/09/2014  . Ganglion cyst of left foot 09/09/2014    History   Social History  . Marital Status: Married    Spouse Name: N/A  . Number of Children: N/A  . Years of Education: N/A   Occupational History  . Not on file.   Social History Main Topics  . Smoking status: Current Every Day Smoker  . Smokeless tobacco: Not on file  . Alcohol Use: Yes     Comment: occ wine  . Drug Use: No  . Sexual Activity: No     Comment: no dietary restrictions. lives alone   Other Topics Concern  . Not on file   Social  History Narrative    Past Surgical History  Procedure Laterality Date  . Tooth extraction  05/2012  . Wisdom tooth extraction  54 yrs old  . Back surgery  2003  . Rotater cuff  20 yrs ago    left shoulder    Family History  Problem Relation Age of Onset  . Parkinson's disease Mother   . Heart disease Mother   . Hypertension Mother   . Diabetes Father     type 2  . Hypertension Father   . Stroke Father   . Vascular Disease Brother   . Hypertension Brother   . Diabetes Brother   . Heart attack Paternal Uncle     No Known Allergies  Current Outpatient Prescriptions on File Prior to Visit  Medication Sig Dispense Refill  . abatacept (ORENCIA) 250 MG injection Inject into the vein once a week.    Marland Kitchen albuterol (VENTOLIN HFA) 108 (90 BASE) MCG/ACT inhaler inhale 2 puffs by mouth every 6 hours if needed for shortness of breath or wheezing 18 g 5  . beclomethasone (QVAR) 40 MCG/ACT inhaler Inhale 2 puffs into the lungs 2 (two) times daily at 10 AM and 5 PM. 1 Inhaler 2  . citalopram (CELEXA) 20 MG tablet Take 1 tablet (20 mg total) by mouth daily. 30 tablet 11  .  diltiazem (CARDIZEM CD) 240 MG 24 hr capsule Take 1 capsule (240 mg total) by mouth daily. 30 capsule 11  . fentaNYL (DURAGESIC - DOSED MCG/HR) 100 MCG/HR Place 1 patch (100 mcg total) onto the skin every other day. 15 patch 0  . gabapentin (NEURONTIN) 600 MG tablet Take 1 tablet (600 mg total) by mouth 3 (three) times daily. 90 tablet 4  . ibuprofen (ADVIL,MOTRIN) 800 MG tablet     . losartan-hydrochlorothiazide (HYZAAR) 50-12.5 MG per tablet Take 1 tablet by mouth daily. 30 tablet 5  . oxyCODONE-acetaminophen (PERCOCET) 10-325 MG per tablet Take 2 tablets by mouth every 4 (four) hours as needed for pain. 180 tablet 0  . predniSONE (DELTASONE) 5 MG tablet Take 1 tablet (5 mg total) by mouth daily with breakfast. (Patient not taking: Reported on 11/26/2014)     No current facility-administered medications on file prior to  visit.    BP 134/80 mmHg  Pulse 78  Temp(Src) 98.1 F (36.7 C) (Oral)  Ht 6' (1.829 m)  Wt 250 lb (113.399 kg)  BMI 33.90 kg/m2  SpO2 97%       Objective:   Physical Exam  General Appearance- Not in acute distress.  HEENT Eyes- Scleraeral/Conjuntiva-bilat- Not Yellow. Mouth & Throat- Normal.  Chest and Lung Exam Auscultation: Breath sounds:-Normal. CTA Adventitious sounds:- No Adventitious sounds.  Cardiovascular Auscultation:Rythm - Regular. Heart Sounds -Normal heart sounds.  Abdomen Inspection:-Inspection Normal.  Palpation/Perucssion: Palpation and Percussion of the abdomen reveal- direct rlq Tender severe , mild  Rebound tenderness, No rigidity(Guarding) and No Palpable abdominal masses.  Liver:-Normal.  Spleen:- Normal.  Moderate pain on heal jar. Mild obturator sign.  Back- no cva tenderness.  Rt hip-no pain on palpation.  Pt has very shuffled gait. Obvious pain when she walks(RLQ)        Assessment & Plan:  Severe abdomen pain. Level 7-9/10. 9 level pain on walking. Pain directly over appendix/rlq region. Called to ED talked with charge nurse and explained presentation. Advised pt to go do now for work up.  Follow u p here after ED eval.  Asked staff to take pt down in wheel chair since obvious severe pain walking.

## 2014-11-26 NOTE — ED Notes (Signed)
EDP informed of pt status.  New orders received for pain medication.

## 2014-11-26 NOTE — ED Notes (Signed)
Returned from [place].  NAD

## 2014-11-26 NOTE — ED Provider Notes (Signed)
CSN: [id number]     Arrival date & time 11/26/14  1516 History   First MD Initiated Contact with Patient 11/26/14 1550     Chief Complaint  Patient presents with  . Abdominal Pain     (Consider location/radiation/quality/duration/timing/severity/associated sxs/prior Treatment) HPI  54 year old female with right lower quadrant abdominal pain since yesterday. Noticed a milder pain yesterday but was not particular bothering her. She has a lot of chronic pain and this did cause her to a much attention. However today the pain has been significantly worse, especially when ever she walks. She has to bend over to walk. She has taken 2 Percocet twice today without any relief. No nausea, vomiting, diarrhea, fever, dysuria, or vaginal discharge. Patient states it feels like she has strained a muscle but she does not recall any activity that would cause her to strain a muscle. Rates her pain as severe.   Past Medical History  Diagnosis Date  . Arthritis   . Hypertension   . Shingles 2014  . Chicken pox as a child  . Mumps as a child  . History of chicken pox   . H/O mumps   . Acute bronchitis 01/05/2014  . Dermatitis 09/09/2014  . Ganglion cyst of left foot 09/09/2014   Past Surgical History  Procedure Laterality Date  . Tooth extraction  05/2012  . Wisdom tooth extraction  54 yrs old  . Back surgery  2003  . Rotater cuff  20 yrs ago    left shoulder   Family History  Problem Relation Age of Onset  . Parkinson's disease Mother   . Heart disease Mother   . Hypertension Mother   . Diabetes Father     type 2  . Hypertension Father   . Stroke Father   . Vascular Disease Brother   . Hypertension Brother   . Diabetes Brother   . Heart attack Paternal Uncle    History  Substance Use Topics  . Smoking status: Current Every Day Smoker  . Smokeless tobacco: Not on file  . Alcohol Use: Yes     Comment: occ wine   OB History    No data available     Review of Systems  Constitutional:  Negative for fever.  Gastrointestinal: Positive for abdominal pain. Negative for nausea, vomiting and diarrhea.  Genitourinary: Negative for dysuria.  Musculoskeletal: Positive for back pain (chronic, not worse with this).  All other systems reviewed and are negative.     Allergies  Review of patient's allergies indicates no known allergies.  Home Medications   Prior to Admission medications   Medication Sig Start Date End Date Taking? Authorizing Provider  abatacept (ORENCIA) 250 MG injection Inject into the vein once a week.    Historical Provider, MD  albuterol (VENTOLIN HFA) 108 (90 BASE) MCG/ACT inhaler inhale 2 puffs by mouth every 6 hours if needed for shortness of breath or wheezing 11/05/14   Mosie Lukes, MD  beclomethasone (QVAR) 40 MCG/ACT inhaler Inhale 2 puffs into the lungs 2 (two) times daily at 10 AM and 5 PM. 09/04/14   Mosie Lukes, MD  citalopram (CELEXA) 20 MG tablet Take 1 tablet (20 mg total) by mouth daily. 09/04/14   Mosie Lukes, MD  diltiazem (CARDIZEM CD) 240 MG 24 hr capsule Take 1 capsule (240 mg total) by mouth daily. 09/04/14   Mosie Lukes, MD  fentaNYL (DURAGESIC - DOSED MCG/HR) 100 MCG/HR Place 1 patch (100 mcg total) onto the skin  every other day. 11/19/14   Mosie Lukes, MD  gabapentin (NEURONTIN) 600 MG tablet Take 1 tablet (600 mg total) by mouth 3 (three) times daily. 09/04/14   Mosie Lukes, MD  ibuprofen (ADVIL,MOTRIN) 800 MG tablet  01/15/14   Historical Provider, MD  losartan-hydrochlorothiazide (HYZAAR) 50-12.5 MG per tablet Take 1 tablet by mouth daily. 09/04/14   Mosie Lukes, MD  oxyCODONE-acetaminophen (PERCOCET) 10-325 MG per tablet Take 2 tablets by mouth every 4 (four) hours as needed for pain. 11/19/14   Mosie Lukes, MD  predniSONE (DELTASONE) 5 MG tablet Take 1 tablet (5 mg total) by mouth daily with breakfast. Patient not taking: Reported on 11/26/2014 06/04/14   Mosie Lukes, MD   BP 139/83 mmHg  Pulse 76  Temp(Src) 98.5 F  (36.9 C) (Oral)  Resp 16  SpO2 96% Physical Exam  Constitutional: She is oriented to person, place, and time. She appears well-developed and well-nourished.  HENT:  Head: Normocephalic and atraumatic.  Right Ear: External ear normal.  Left Ear: External ear normal.  Nose: Nose normal.  Eyes: Right eye exhibits no discharge. Left eye exhibits no discharge.  Cardiovascular: Normal rate, regular rhythm and normal heart sounds.   Pulmonary/Chest: Effort normal and breath sounds normal.  Abdominal: Soft. There is tenderness in the right lower quadrant. There is tenderness at McBurney's point.  Neurological: She is alert and oriented to person, place, and time.  Skin: Skin is warm and dry.  Nursing note and vitals reviewed.   ED Course  Procedures (including critical care time) Labs Review Labs Reviewed  URINALYSIS, ROUTINE W REFLEX MICROSCOPIC (NOT AT [facility] - Abnormal; Notable for the following:    Specific Gravity, Urine 1.004 (*)    Hgb urine dipstick TRACE (*)    All other components within normal limits  COMPREHENSIVE METABOLIC PANEL - Abnormal; Notable for the following:    Chloride 98 (*)    Glucose, Bld 101 (*)    All other components within normal limits  CBC WITH DIFFERENTIAL/PLATELET  URINE MICROSCOPIC-ADD ON    Imaging Review US Transvaginal Non-ob  11/26/2014   CLINICAL DATA:  Acute onset of right lower quadrant abdominal pain and right pelvic pain earlier today.  EXAM: TRANSABDOMINAL AND TRANSVAGINAL ULTRASOUND OF PELVIS  TECHNIQUE: Both transabdominal and transvaginal ultrasound examinations of the pelvis were performed. Transabdominal technique was performed for global imaging of the pelvis including uterus, ovaries, adnexal regions, and pelvic cul-de-sac. It was necessary to proceed with endovaginal exam following the transabdominal exam to visualize the endometrium and ovaries, as the bladder was incompletely distended.  COMPARISON:  CT abdomen and pelvis performed  earlier today.  FINDINGS: Uterus  Measurements: Approximately 5.3 x 2.5 x 3.7 cm. Homogeneous echotexture without focal fibroid or other myometrial abnormality. Normal-appearing uterine cervix.  Endometrium  Thickness: Approximately 2 mm. Normal appearance without evidence of endometrial fluid or mass.  Right ovary  Measurements: Approximately 3.1 x 1.2 x 1.3 cm. Only visualized transvaginally, and difficult to visualize due to its superior position in the pelvis as noted on the earlier CT. No dominant cyst or solid mass. Normal appearance on the earlier CT.  Left ovary  Not visualized either transabdominally or transvaginally. Normal appearance on the earlier CT, also superiorly positioned in the pelvis.  Other findings  No free fluid.  IMPRESSION: 1. Ovaries difficult to visualize by ultrasound due to their superior location in the pelvis as noted on the earlier CT. Right ovary unremarkable by ultrasound. Left  ovary not visualized. Both ovaries have a normal appearance on the earlier CT. 2. Normal-appearing uterus and endometrium.   Electronically Signed   By: Evangeline Dakin M.D.   On: 11/26/2014 19:29   US Pelvis Complete  11/26/2014   CLINICAL DATA:  Acute onset of right lower quadrant abdominal pain and right pelvic pain earlier today.  EXAM: TRANSABDOMINAL AND TRANSVAGINAL ULTRASOUND OF PELVIS  TECHNIQUE: Both transabdominal and transvaginal ultrasound examinations of the pelvis were performed. Transabdominal technique was performed for global imaging of the pelvis including uterus, ovaries, adnexal regions, and pelvic cul-de-sac. It was necessary to proceed with endovaginal exam following the transabdominal exam to visualize the endometrium and ovaries, as the bladder was incompletely distended.  COMPARISON:  CT abdomen and pelvis performed earlier today.  FINDINGS: Uterus  Measurements: Approximately 5.3 x 2.5 x 3.7 cm. Homogeneous echotexture without focal fibroid or other myometrial abnormality.  Normal-appearing uterine cervix.  Endometrium  Thickness: Approximately 2 mm. Normal appearance without evidence of endometrial fluid or mass.  Right ovary  Measurements: Approximately 3.1 x 1.2 x 1.3 cm. Only visualized transvaginally, and difficult to visualize due to its superior position in the pelvis as noted on the earlier CT. No dominant cyst or solid mass. Normal appearance on the earlier CT.  Left ovary  Not visualized either transabdominally or transvaginally. Normal appearance on the earlier CT, also superiorly positioned in the pelvis.  Other findings  No free fluid.  IMPRESSION: 1. Ovaries difficult to visualize by ultrasound due to their superior location in the pelvis as noted on the earlier CT. Right ovary unremarkable by ultrasound. Left ovary not visualized. Both ovaries have a normal appearance on the earlier CT. 2. Normal-appearing uterus and endometrium.   Electronically Signed   By: Evangeline Dakin M.D.   On: 11/26/2014 19:29   Ct Abdomen Pelvis W Contrast  11/26/2014   CLINICAL DATA:  54 year old female with right lower quadrant pain times 2-3 days.  EXAM: CT ABDOMEN AND PELVIS WITH CONTRAST  TECHNIQUE: Multidetector CT imaging of the abdomen and pelvis was performed using the standard protocol following bolus administration of intravenous contrast.  CONTRAST:  44mL OMNIPAQUE IOHEXOL 300 MG/ML SOLN, 135mL OMNIPAQUE IOHEXOL 300 MG/ML SOLN  COMPARISON:  MRI dated 12/07/2011  FINDINGS: CT the visualized lung bases are clear.  No intra-abdominal free air or free fluid.  A 2.2 x 2.1 cm ill-defined enhancing focus in the left lobe of the liver (series 2, image 9) is not well characterized but may represent a flash filling hemangioma or Edit portal venous shunting. Focal 3 cm subcapsular linear enhancement along the right lobe of the liver (series 2, image 9) may represent the portal venous shunting. MRI may provide better characterization. The gallbladder, pancreas, spleen, adrenal glands,  kidneys, visualized ureters, and urinary bladder appear unremarkable. Subcentimeter left renal hypodense lesion is too small to characterize. The uterus and the ovaries are grossly unremarkable.  Moderate stool noted throughout the colon. No evidence of bowel obstruction or inflammation. The appendix is unremarkable.  The visualized abdominal aorta appears unremarkable. The IVC, SMV, splenic vein, and main portal vein are patent. No lymphadenopathy.  Small fat containing umbilical hernia. Degenerative changes of the spine. Go at  IMPRESSION: No acute intra-abdominal or pelvic pathology. No evidence of bowel obstruction or inflammation. Normal appendix.  Ill-defined enhancing foci within the liver as described which are not well characterized but may represent hemangiomas or portal venous shunting. Other etiologies are not excluded. MRI may provide better evaluation.  Electronically Signed   By: Anner Crete M.D.   On: 11/26/2014 17:14     EKG Interpretation None      MDM   Final diagnoses:  RLQ abdominal pain    No clear etiology for the patient's right lower quadrant abdominal pain. With the negative CT scan and negative ultrasound I have low suspicion for acute emergent pathology in her abdomen. She's very sensitive to light touch and with exacerbation of pain with leg movement and walking this could be musculoskeletal in etiology. There is no evidence of hernia. The patient states she has not had intercourse since she left her husband 3 years ago and denies vaginal discharge or other infectious symptoms. I discussed a pelvic exam but she declines. I have very low suspicion this would be missed PID or TOA. At this point will treat pain, recommend follow-up with PCP in 24-48 hours of pain is still present and discussed strict return precautions.    Sherwood Gambler, MD 11/26/14 [phone]

## 2014-11-26 NOTE — ED Notes (Signed)
Patient transported to Ultrasound 

## 2014-11-26 NOTE — Discharge Instructions (Signed)

## 2014-11-26 NOTE — Progress Notes (Signed)
Pre visit review using our clinic review tool, if applicable. No additional management support is needed unless otherwise documented below in the visit note. 

## 2014-11-26 NOTE — ED Notes (Signed)
Patient at radiology dept.

## 2014-12-22 ENCOUNTER — Other Ambulatory Visit: Payer: Self-pay | Admitting: Family Medicine

## 2014-12-22 DIAGNOSIS — R52 Pain, unspecified: Secondary | ICD-10-CM

## 2014-12-22 DIAGNOSIS — M069 Rheumatoid arthritis, unspecified: Secondary | ICD-10-CM

## 2014-12-22 NOTE — Telephone Encounter (Signed)
Ok to refill both requested meds

## 2014-12-22 NOTE — Telephone Encounter (Signed)
Caller name: Lamerle Jabs Relation to BR:AXEN Call back [phone] Pharmacy:  Reason for call: pt is needing rx oxyCODONE-acetaminophen (PERCOCET) 10-325 MG per tablet  And fentaNYL (DURAGESIC - DOSED MCG/HR) 100 MCG/HR  Please call when available for pick up.

## 2014-12-23 MED ORDER — OXYCODONE-ACETAMINOPHEN 10-325 MG PO TABS: 2.0000 | ORAL_TABLET | ORAL | Status: DC | PRN

## 2014-12-23 MED ORDER — FENTANYL 100 MCG/HR TD PT72: 100.0000 ug | MEDICATED_PATCH | TRANSDERMAL | Status: DC

## 2014-12-23 NOTE — Telephone Encounter (Signed)
Medications refilled available for pick up tomorrow.

## 2015-01-05 ENCOUNTER — Ambulatory Visit (INDEPENDENT_AMBULATORY_CARE_PROVIDER_SITE_OTHER): Payer: 59 | Admitting: Family Medicine

## 2015-01-05 ENCOUNTER — Encounter: Payer: Self-pay | Admitting: Family Medicine

## 2015-01-05 VITALS — BP 130/86 | HR 75 | Temp 96.0°F | Ht 72.0 in | Wt 250.0 lb

## 2015-01-05 DIAGNOSIS — I10 Essential (primary) hypertension: Secondary | ICD-10-CM | POA: Diagnosis not present

## 2015-01-05 DIAGNOSIS — M069 Rheumatoid arthritis, unspecified: Secondary | ICD-10-CM | POA: Diagnosis not present

## 2015-01-05 DIAGNOSIS — Z720 Tobacco use: Secondary | ICD-10-CM

## 2015-01-05 DIAGNOSIS — E669 Obesity, unspecified: Secondary | ICD-10-CM

## 2015-01-05 DIAGNOSIS — F17200 Nicotine dependence, unspecified, uncomplicated: Secondary | ICD-10-CM

## 2015-01-05 DIAGNOSIS — M545 Low back pain: Secondary | ICD-10-CM | POA: Diagnosis not present

## 2015-01-05 DIAGNOSIS — R52 Pain, unspecified: Secondary | ICD-10-CM | POA: Diagnosis not present

## 2015-01-05 MED ORDER — FENTANYL 100 MCG/HR TD PT72: 100.0000 ug | MEDICATED_PATCH | TRANSDERMAL | Status: DC

## 2015-01-05 MED ORDER — HYOSCYAMINE SULFATE 0.125 MG SL SUBL: 0.1250 mg | SUBLINGUAL_TABLET | SUBLINGUAL | Status: DC | PRN

## 2015-01-05 MED ORDER — OXYCODONE-ACETAMINOPHEN 10-325 MG PO TABS: 2.0000 | ORAL_TABLET | ORAL | Status: DC | PRN

## 2015-01-05 NOTE — Patient Instructions (Signed)
Aspercreme, salon pas or aspercreme with Lidocaine patches for pain  Ganglion Cyst A ganglion cyst is a noncancerous, fluid-filled lump that occurs near joints or tendons. The ganglion cyst grows out of a joint or the lining of a tendon. It most often develops in the hand or wrist but can also develop in the shoulder, elbow, hip, knee, ankle, or foot. The round or oval ganglion can be pea sized or larger than a grape. Increased activity may enlarge the size of the cyst because more fluid starts to build up.  CAUSES  It is not completely known what causes a ganglion cyst to grow. However, it may be related to:  Inflammation or irritation around the joint.  An injury.  Repetitive movements or overuse.  Arthritis. SYMPTOMS  A lump most often appears in the hand or wrist, but can occur in other areas of the body. Generally, the lump is painless without other symptoms. However, sometimes pain can be felt during activity or when pressure is applied to the lump. The lump may even be tender to the touch. Tingling, pain, numbness, or muscle weakness can occur if the ganglion cyst presses on a nerve. Your grip may be weak and you may have less movement in your joints.  DIAGNOSIS  Ganglion cysts are most often diagnosed based on a physical exam, noting where the cyst is and how it looks. Your caregiver will feel the lump and may shine a light alongside it. If it is a ganglion, a light often shines through it. Your caregiver may order an X-ray, ultrasound, or MRI to rule out other conditions. TREATMENT  Ganglions usually go away on their own without treatment. If pain or other symptoms are involved, treatment may be needed. Treatment is also needed if the ganglion limits your movement or if it gets infected. Treatment options include:  Wearing a wrist or finger brace or splint.  Taking anti-inflammatory medicine.  Draining fluid from the lump with a needle (aspiration).  Injecting a steroid into the  joint.  Surgery to remove the ganglion cyst and its stalk that is attached to the joint or tendon. However, ganglion cysts can grow back. HOME CARE INSTRUCTIONS   Do not press on the ganglion, poke it with a needle, or hit it with a heavy object. You may rub the lump gently and often. Sometimes fluid moves out of the cyst.  Only take medicines as directed by your caregiver.  Wear your brace or splint as directed by your caregiver. SEEK MEDICAL CARE IF:   Your ganglion becomes larger or more painful.  You have increased redness, red streaks, or swelling.  You have pus coming from the lump.  You have weakness or numbness in the affected area. MAKE SURE YOU:   Understand these instructions.  Will watch your condition.  Will get help right away if you are not doing well or get worse. Document Released: 05/05/2000 Document Revised: 01/31/2012 Document Reviewed: 07/02/2007 [facility] Patient Information 2015 [place], [place]. This information is not intended to replace advice given to you by your health care provider. Make sure you discuss any questions you have with your health care provider.

## 2015-01-05 NOTE — Progress Notes (Signed)
Pre visit review using our clinic review tool, if applicable. No additional management support is needed unless otherwise documented below in the visit note. 

## 2015-01-17 NOTE — Assessment & Plan Note (Signed)
Well controlled, no changes to meds. Encouraged heart healthy diet such as the DASH diet and exercise as tolerated.  °

## 2015-01-17 NOTE — Assessment & Plan Note (Signed)
Encouraged DASH diet, decrease po intake and increase exercise as tolerated. Needs 7-8 hours of sleep nightly. Avoid trans fats, eat small, frequent meals every 4-5 hours with lean proteins, complex carbs and healthy fats. Minimize simple carbs, GMO foods. 

## 2015-01-17 NOTE — Assessment & Plan Note (Signed)
Managing with current meds. Allowed refills, stay as active as possible

## 2015-01-17 NOTE — Progress Notes (Signed)
Jennifer Kelly [id number] [birth year] 01/17/2015      Progress Note New Patient  Subjective  Chief Complaint  Chief Complaint  Patient presents with  . Follow-up    2 month    HPI  Patient is a 54 year old female in today for routine medical care. She continues to struggle with daily back pain secondary to her spinal stenosis. No recent or new injury. continues to struggle with numerous joint pains and chronic myalgias. Pain meds are helpful but she needs them daily. She continues to follow with rheumatology. No recent illness or acute complaint. Denies CP/palp/SOB/HA/congestion/fevers/GI or GU c/o. Taking meds as prescribed  Past Medical History  Diagnosis Date  . Arthritis   . Hypertension   . Shingles 2014  . Chicken pox as a child  . Mumps as a child  . History of chicken pox   . H/O mumps   . Acute bronchitis 01/05/2014  . Dermatitis 09/09/2014  . Ganglion cyst of left foot 09/09/2014    Past Surgical History  Procedure Laterality Date  . Tooth extraction  05/2012  . Wisdom tooth extraction  54 yrs old  . Back surgery  2003  . Rotater cuff  20 yrs ago    left shoulder    Family History  Problem Relation Age of Onset  . Parkinson's disease Mother   . Heart disease Mother   . Hypertension Mother   . Diabetes Father     type 2  . Hypertension Father   . Stroke Father   . Vascular Disease Brother   . Hypertension Brother   . Diabetes Brother   . Heart attack Paternal Uncle     Social History   Social History  . Marital Status: Married    Spouse Name: N/A  . Number of Children: N/A  . Years of Education: N/A   Occupational History  . Not on file.   Social History Main Topics  . Smoking status: Current Every Day Smoker  . Smokeless tobacco: Not on file  . Alcohol Use: Yes     Comment: occ wine  . Drug Use: No  . Sexual Activity: No     Comment: no dietary restrictions. lives alone   Other Topics Concern  . Not on file   Social History Narrative     Current Outpatient Prescriptions on File Prior to Visit  Medication Sig Dispense Refill  . abatacept (ORENCIA) 250 MG injection Inject into the vein once a week.    Marland Kitchen albuterol (VENTOLIN HFA) 108 (90 BASE) MCG/ACT inhaler inhale 2 puffs by mouth every 6 hours if needed for shortness of breath or wheezing 18 g 5  . beclomethasone (QVAR) 40 MCG/ACT inhaler Inhale 2 puffs into the lungs 2 (two) times daily at 10 AM and 5 PM. 1 Inhaler 2  . citalopram (CELEXA) 20 MG tablet Take 1 tablet (20 mg total) by mouth daily. 30 tablet 11  . diltiazem (CARDIZEM CD) 240 MG 24 hr capsule Take 1 capsule (240 mg total) by mouth daily. 30 capsule 11  . gabapentin (NEURONTIN) 600 MG tablet Take 1 tablet (600 mg total) by mouth 3 (three) times daily. 90 tablet 4  . ibuprofen (ADVIL,MOTRIN) 800 MG tablet     . losartan-hydrochlorothiazide (HYZAAR) 50-12.5 MG per tablet Take 1 tablet by mouth daily. 30 tablet 5  . predniSONE (DELTASONE) 5 MG tablet Take 1 tablet (5 mg total) by mouth daily with breakfast.     No current facility-administered medications  on file prior to visit.    No Known Allergies  Review of Systems  Review of Systems  Constitutional: Negative for fever and malaise/fatigue.  HENT: Negative for congestion.   Eyes: Negative for discharge.  Respiratory: Negative for shortness of breath.   Cardiovascular: Negative for chest pain, palpitations and leg swelling.  Gastrointestinal: Negative for nausea and abdominal pain.  Genitourinary: Negative for dysuria.  Musculoskeletal: Positive for myalgias, back pain, joint pain and neck pain. Negative for falls.  Skin: Negative for rash.  Neurological: Negative for loss of consciousness and headaches.  Endo/Heme/Allergies: Negative for environmental allergies.  Psychiatric/Behavioral: Negative for depression. The patient is not nervous/anxious.     Objective  BP 130/86 mmHg  Pulse 75  Temp(Src) 96 F (35.6 C) (Oral)  Ht 6' (1.829 m)  Wt  250 lb (113.399 kg)  BMI 33.90 kg/m2  SpO2 96%  Physical Exam  Physical Exam  Constitutional: She is oriented to person, place, and time and well-developed, well-nourished, and in no distress. No distress.  HENT:  Head: Normocephalic and atraumatic.  Eyes: Conjunctivae are normal.  Neck: Neck supple. No thyromegaly present.  Cardiovascular: Normal rate, regular rhythm and normal heart sounds.   No murmur heard. Pulmonary/Chest: Effort normal and breath sounds normal. She has no wheezes.  Abdominal: She exhibits no distension and no mass.  Musculoskeletal: She exhibits no edema.  Lymphadenopathy:    She has no cervical adenopathy.  Neurological: She is alert and oriented to person, place, and time.  Skin: Skin is warm and dry. No rash noted. She is not diaphoretic.  Psychiatric: Memory, affect and judgment normal.       Assessment & Plan  Hypertension Well controlled, no changes to meds. Encouraged heart healthy diet such as the DASH diet and exercise as tolerated.   LOW BACK PAIN Managing with current meds. Allowed refills, stay as active as possible  Obesity Encouraged DASH diet, decrease po intake and increase exercise as tolerated. Needs 7-8 hours of sleep nightly. Avoid trans fats, eat small, frequent meals every 4-5 hours with lean proteins, complex carbs and healthy fats. Minimize simple carbs, GMO foods.  Rheumatoid arthritis Continues to follow with rheumatology  TOBACCO ABUSE Encouraged complete cessation. Discussed need to quit as relates to risk of numerous cancers, cardiac and pulmonary disease as well as neurologic complications. Counseled for greater than 3 minutes

## 2015-01-17 NOTE — Assessment & Plan Note (Signed)
Encouraged complete cessation. Discussed need to quit as relates to risk of numerous cancers, cardiac and pulmonary disease as well as neurologic complications. Counseled for greater than 3 minutes 

## 2015-01-17 NOTE — Assessment & Plan Note (Signed)
Continues to follow with rheumatology 

## 2015-02-17 ENCOUNTER — Telehealth: Payer: Self-pay | Admitting: Family Medicine

## 2015-02-17 DIAGNOSIS — M069 Rheumatoid arthritis, unspecified: Secondary | ICD-10-CM

## 2015-02-17 DIAGNOSIS — R52 Pain, unspecified: Secondary | ICD-10-CM

## 2015-02-17 NOTE — Telephone Encounter (Signed)
Relation to AY:GEFU Call back number:[phone]   Reason for call:  Patient requesting a refill oxyCODONE-acetaminophen (PERCOCET) 10-325 MG per tablet and fentaNYL (DURAGESIC - DOSED MCG/HR) 100 MCG/HR

## 2015-02-18 MED ORDER — OXYCODONE-ACETAMINOPHEN 10-325 MG PO TABS: 2.0000 | ORAL_TABLET | ORAL | Status: DC | PRN

## 2015-02-18 MED ORDER — FENTANYL 100 MCG/HR TD PT72: 100.0000 ug | MEDICATED_PATCH | TRANSDERMAL | Status: DC

## 2015-02-18 NOTE — Telephone Encounter (Signed)
Printed and on counter for signature. 

## 2015-02-18 NOTE — Telephone Encounter (Signed)
Requesting: Oxycodone and Fentanyl Patch. Contract  02/13/14 UDS  06/04/14   MODERATE  Next due 09/03/14 Last OV   01/05/15 Last Refill   01/05/15   #180 oxy and #15 patch  Please Advise

## 2015-02-18 NOTE — Telephone Encounter (Signed)
OK to refill both meds  No changes, needs UDS

## 2015-02-18 NOTE — Telephone Encounter (Signed)
Called the patient informed both hardcopy's are ready for pickup at the front desk.  Also to do UDS before getting hardcopy's.

## 2015-03-01 ENCOUNTER — Other Ambulatory Visit: Payer: 59

## 2015-03-08 ENCOUNTER — Ambulatory Visit: Payer: 59 | Admitting: Family Medicine

## 2015-03-08 ENCOUNTER — Ambulatory Visit (INDEPENDENT_AMBULATORY_CARE_PROVIDER_SITE_OTHER): Payer: 59 | Admitting: Family Medicine

## 2015-03-08 ENCOUNTER — Encounter: Payer: Self-pay | Admitting: Family Medicine

## 2015-03-08 VITALS — BP 134/64 | HR 71 | Temp 98.2°F | Ht 72.0 in | Wt 248.2 lb

## 2015-03-08 DIAGNOSIS — Z1239 Encounter for other screening for malignant neoplasm of breast: Secondary | ICD-10-CM

## 2015-03-08 DIAGNOSIS — R079 Chest pain, unspecified: Secondary | ICD-10-CM

## 2015-03-08 DIAGNOSIS — E669 Obesity, unspecified: Secondary | ICD-10-CM

## 2015-03-08 DIAGNOSIS — F17200 Nicotine dependence, unspecified, uncomplicated: Secondary | ICD-10-CM | POA: Diagnosis not present

## 2015-03-08 DIAGNOSIS — I10 Essential (primary) hypertension: Secondary | ICD-10-CM

## 2015-03-08 DIAGNOSIS — R52 Pain, unspecified: Secondary | ICD-10-CM | POA: Diagnosis not present

## 2015-03-08 DIAGNOSIS — M069 Rheumatoid arthritis, unspecified: Secondary | ICD-10-CM

## 2015-03-08 DIAGNOSIS — Z124 Encounter for screening for malignant neoplasm of cervix: Secondary | ICD-10-CM

## 2015-03-08 DIAGNOSIS — M545 Low back pain: Secondary | ICD-10-CM

## 2015-03-08 MED ORDER — OXYCODONE-ACETAMINOPHEN 10-325 MG PO TABS: 2.0000 | ORAL_TABLET | ORAL | Status: DC | PRN

## 2015-03-08 MED ORDER — FENTANYL 100 MCG/HR TD PT72: 100.0000 ug | MEDICATED_PATCH | TRANSDERMAL | Status: DC

## 2015-03-08 NOTE — Patient Instructions (Signed)
Tobacco Use Disorder Tobacco use disorder (TUD) is a mental disorder. It is the long-term use of tobacco in spite of related health problems or difficulty with normal life activities. Tobacco is most commonly smoked as cigarettes and less commonly as cigars or pipes. Smokeless chewing tobacco and snuff are also popular. People with TUD get a feeling of extreme pleasure (euphoria) from using tobacco and have a desire to use it again and again. Repeated use of tobacco can cause problems. The addictive effects of tobacco are due mainly tothe ingredient nicotine. Nicotine also causes a rush of adrenaline (epinephrine) in the body. This leads to increased blood pressure, heart rate, and breathing rate. These changes may cause problems for people with high blood pressure, weak hearts, or lung disease. High doses of nicotine in children and pets can lead to seizures and death.  Tobacco contains a number of other unsafe chemicals. These chemicals are especially harmful when inhaled as smoke and can damage almost every organ in the body. Smokers live shorter lives than nonsmokers and are at risk of dying from a number of diseases and cancers. Tobacco smoke can also cause health problems for nonsmokers (due to inhaling secondhand smoke). Smoking is also a fire hazard.  TUD usually starts in the late teenage years and is most common in young adults between the ages of 18 and 25 years. People who start smoking earlier in life are more likely to continue smoking as adults. TUD is somewhat more common in men than women. People with TUD are at higher risk for using alcohol and other drugs of abuse. RISK FACTORS Risk factors for TUD include:   Having family members with the disorder.  Being around people who use tobacco.  Having an existing mental health issue such as schizophrenia, depression, bipolar disorder, ADHD, or posttraumatic stress disorder (PTSD). SIGNS AND SYMPTOMS  People with tobacco use disorder have  two or more of the following signs and symptoms within 12 months:   Use of more tobacco over a longer period than intended.   Not able to cut down or control tobacco use.   A lot of time spent obtaining or using tobacco.   Strong desire or urge to use tobacco (craving). Cravings may last for 6 months or longer after quitting.  Use of tobacco even when use leads to major problems at work, school, or home.   Use of tobacco even when use leads to relationship problems.   Giving up or cutting down on important life activities because of tobacco use.   Repeatedly using tobacco in situations where it puts you or others in physical danger, like smoking in bed.   Use of tobacco even when it is known that a physical or mental problem is likely related to tobacco use.   Physical problems are numerous and may include chronic bronchitis, emphysema, lung and other cancers, gum disease, high blood pressure, heart disease, and stroke.   Mental problems caused by tobacco may include difficulty sleeping and anxiety.  Need to use greater amounts of tobacco to get the same effect. This means you have developed a tolerance.   Withdrawal symptoms as a result of stopping or rapidly cutting back use. These symptoms may last a month or more after quitting and include the following:   Depressed, anxious, or irritable mood.   Difficulty concentrating.   Increased appetite.  Restlessness or trouble sleeping.   Use of tobacco to avoid withdrawal symptoms. DIAGNOSIS  Tobacco use disorder is diagnosed by   your health care provider. A diagnosis may be made by:  Your health care provider asking questions about your tobacco use and any problems it may be causing.  A physical exam.  Lab tests.  You may be referred to a mental health professional or addiction specialist. The severity of tobacco use disorder depends on the number of signs and symptoms you have:   Mild--Two or three  symptoms.  Moderate--Four or five symptoms.   Severe--Six or more symptoms.  TREATMENT  Many people with tobacco use disorder are unable to quit on their own and need help. Treatment options include the following:  Nicotine replacement therapy (NRT). NRT provides nicotine without the other harmful chemicals in tobacco. NRT gradually lowers the dosage of nicotine in the body and reduces withdrawal symptoms. NRT is available in over-the-counter forms (gum, lozenges, and skin patches) as well as prescription forms (mouth inhaler and nasal spray).  Medicines.This may include:  Antidepressant medicine that may reduce nicotine cravings.  A medicine that acts on nicotine receptors in the brain to reduce cravings and withdrawal symptoms. It may also block the effects of tobacco in people with TUD who relapse.  Counseling or talk therapy. A form of talk therapy called behavioral therapy is commonly used to treat people with TUD. Behavioral therapy looks at triggers for tobacco use, how to avoid them, and how to cope with cravings. It is most effective in person or by phone but is also available in self-help forms (books and Internet websites).  Support groups. These provide emotional support, advice, and guidance for quitting tobacco. The most effective treatment for TUD is usually a combination of medicine, talk therapy, and support groups. HOME CARE INSTRUCTIONS  Keep all follow-up visits as directed by your health care provider. This is important.  Take medicines only as directed by your health care provider.  Check with your health care provider before starting new prescription or over-the-counter medicines. SEEK MEDICAL CARE IF:  You are not able to take your medicines as prescribed.  Treatment is not helping your TUD and your symptoms get worse. SEEK IMMEDIATE MEDICAL CARE IF:  You have serious thoughts about hurting yourself or others.  You have trouble breathing, chest pain,  sudden weakness, or sudden numbness in part of your body.   This information is not intended to replace advice given to you by your health care provider. Make sure you discuss any questions you have with your health care provider.   Document Released: 01/12/2004 Document Revised: 05/29/2014 Document Reviewed: 07/04/2013 Elsevier Interactive Patient Education 2016 [employer]  

## 2015-03-08 NOTE — Progress Notes (Signed)
Pre visit review using our clinic review tool, if applicable. No additional management support is needed unless otherwise documented below in the visit note. 

## 2015-03-12 ENCOUNTER — Encounter: Payer: Self-pay | Admitting: Family Medicine

## 2015-03-12 ENCOUNTER — Other Ambulatory Visit: Payer: Self-pay | Admitting: Family Medicine

## 2015-03-12 ENCOUNTER — Telehealth: Payer: Self-pay

## 2015-03-12 MED ORDER — AMOXICILLIN 500 MG PO CAPS: 500.0000 mg | ORAL_CAPSULE | Freq: Three times a day (TID) | ORAL | Status: DC

## 2015-03-12 NOTE — Telephone Encounter (Signed)
Pt called back in to check the status of her request. Informed pt that provider is out of the office today but I will definitely relay the message. If Dr. Jacinto Reap is comfortable with sending the Rx, pt would like to have it sent to the [facility] on file.    Pharmacy: [facility], [place]

## 2015-03-12 NOTE — Telephone Encounter (Signed)
Patient cracked tooth wants to know if you will give her a rx for antibiotics cannot getting with dentist he on vaction on call will not return her calls. Please advise

## 2015-03-14 NOTE — Assessment & Plan Note (Signed)
Well controlled, no changes to meds. Encouraged heart healthy diet such as the DASH diet and exercise as tolerated.  °

## 2015-03-14 NOTE — Assessment & Plan Note (Signed)
Encouraged complete cessation. Discussed need to quit as relates to risk of numerous cancers, cardiac and pulmonary disease as well as neurologic complications. Counseled for greater than 3 minutes 

## 2015-03-14 NOTE — Progress Notes (Signed)
Subjective:    Patient ID: Jennifer Kelly, female    DOB: 09/05/1960, 54 y.o.   MRN: [id number]  Chief Complaint  Patient presents with  . Follow-up    HPI Patient is in today for follow-up. Feeling fairly well. Has had some recent trouble with some mild congestion but no fevers or chills. Continues to struggle with back pain finds it manageable with current meds. Has been having some left heel pain but it is slowly improving. No other acute complaints. Denies CP/palp/SOB/HA/congestion/fevers/GI or GU c/o. Taking meds as prescribed  Past Medical History  Diagnosis Date  . Arthritis   . Hypertension   . Shingles 2014  . Chicken pox as a child  . Mumps as a child  . History of chicken pox   . H/O mumps   . Acute bronchitis 01/05/2014  . Dermatitis 09/09/2014  . Ganglion cyst of left foot 09/09/2014    Past Surgical History  Procedure Laterality Date  . Tooth extraction  05/2012  . Wisdom tooth extraction  54 yrs old  . Back surgery  2003  . Rotater cuff  20 yrs ago    left shoulder    Family History  Problem Relation Age of Onset  . Parkinson's disease Mother   . Heart disease Mother   . Hypertension Mother   . Diabetes Father     type 2  . Hypertension Father   . Stroke Father   . Vascular Disease Brother   . Hypertension Brother   . Diabetes Brother   . Heart attack Paternal Uncle     Social History   Social History  . Marital Status: Married    Spouse Name: N/A  . Number of Children: N/A  . Years of Education: N/A   Occupational History  . Not on file.   Social History Main Topics  . Smoking status: Current Every Day Smoker  . Smokeless tobacco: Not on file  . Alcohol Use: Yes     Comment: occ wine  . Drug Use: No  . Sexual Activity: No     Comment: no dietary restrictions. lives alone   Other Topics Concern  . Not on file   Social History Narrative    Outpatient Prescriptions Prior to Visit  Medication Sig Dispense Refill  . abatacept  (ORENCIA) 250 MG injection Inject into the vein once a week.    Marland Kitchen albuterol (VENTOLIN HFA) 108 (90 BASE) MCG/ACT inhaler inhale 2 puffs by mouth every 6 hours if needed for shortness of breath or wheezing 18 g 5  . beclomethasone (QVAR) 40 MCG/ACT inhaler Inhale 2 puffs into the lungs 2 (two) times daily at 10 AM and 5 PM. 1 Inhaler 2  . citalopram (CELEXA) 20 MG tablet Take 1 tablet (20 mg total) by mouth daily. 30 tablet 11  . diltiazem (CARDIZEM CD) 240 MG 24 hr capsule Take 1 capsule (240 mg total) by mouth daily. 30 capsule 11  . gabapentin (NEURONTIN) 600 MG tablet Take 1 tablet (600 mg total) by mouth 3 (three) times daily. 90 tablet 4  . hyoscyamine (LEVSIN SL) 0.125 MG SL tablet Place 1 tablet (0.125 mg total) under the tongue every 4 (four) hours as needed. 30 tablet 0  . ibuprofen (ADVIL,MOTRIN) 800 MG tablet     . losartan-hydrochlorothiazide (HYZAAR) 50-12.5 MG per tablet Take 1 tablet by mouth daily. 30 tablet 5  . predniSONE (DELTASONE) 5 MG tablet Take 1 tablet (5 mg total) by mouth daily with  breakfast.    . fentaNYL (DURAGESIC - DOSED MCG/HR) 100 MCG/HR Place 1 patch (100 mcg total) onto the skin every other day. Can be filled on or after 01/21/2015 15 patch 0  . oxyCODONE-acetaminophen (PERCOCET) 10-325 MG tablet Take 2 tablets by mouth every 4 (four) hours as needed for pain. Can be filled on or after 01/21/2015 180 tablet 0   No facility-administered medications prior to visit.    No Known Allergies  Review of Systems  Constitutional: Negative for fever and malaise/fatigue.  HENT: Negative for congestion.   Eyes: Negative for discharge.  Respiratory: Negative for shortness of breath.   Cardiovascular: Negative for chest pain, palpitations and leg swelling.  Gastrointestinal: Negative for nausea and abdominal pain.  Genitourinary: Negative for dysuria.  Musculoskeletal: Negative for falls.  Skin: Negative for rash.  Neurological: Negative for loss of consciousness and  headaches.  Endo/Heme/Allergies: Negative for environmental allergies.  Psychiatric/Behavioral: Negative for depression. The patient is not nervous/anxious.        Objective:    Physical Exam  Constitutional: She is oriented to person, place, and time. She appears well-developed and well-nourished. No distress.  HENT:  Head: Normocephalic and atraumatic.  Nose: Nose normal.  Eyes: Right eye exhibits no discharge. Left eye exhibits no discharge.  Neck: Normal range of motion. Neck supple.  Cardiovascular: Normal rate and regular rhythm.   No murmur heard. Pulmonary/Chest: Effort normal and breath sounds normal.  Abdominal: Soft. Bowel sounds are normal. There is no tenderness.  Musculoskeletal: She exhibits no edema.  Neurological: She is alert and oriented to person, place, and time.  Skin: Skin is warm and dry.  Psychiatric: She has a normal mood and affect.  Nursing note and vitals reviewed.   BP 134/64 mmHg  Pulse 71  Temp(Src) 98.2 F (36.8 C) (Oral)  Ht 6' (1.829 m)  Wt 248 lb 4 oz (112.605 kg)  BMI 33.66 kg/m2  SpO2 96% Wt Readings from Last 3 Encounters:  03/08/15 248 lb 4 oz (112.605 kg)  01/05/15 250 lb (113.399 kg)  11/26/14 250 lb (113.399 kg)     Lab Results  Component Value Date   WBC 9.4 11/26/2014   HGB 14.1 11/26/2014   HCT 43.8 11/26/2014   PLT 255 11/26/2014   GLUCOSE 101* 11/26/2014   CHOL 180 02/17/2014   TRIG 112.0 02/17/2014   HDL 78.60 02/17/2014   LDLCALC 79 02/17/2014   ALT 23 11/26/2014   AST 21 11/26/2014   NA 137 11/26/2014   K 3.7 11/26/2014   CL 98* 11/26/2014   CREATININE 0.68 11/26/2014   BUN 12 11/26/2014   CO2 31 11/26/2014   TSH 2.12 06/04/2014    Lab Results  Component Value Date   TSH 2.12 06/04/2014   Lab Results  Component Value Date   WBC 9.4 11/26/2014   HGB 14.1 11/26/2014   HCT 43.8 11/26/2014   MCV 89.0 11/26/2014   PLT 255 11/26/2014   Lab Results  Component Value Date   NA 137 11/26/2014   K  3.7 11/26/2014   CO2 31 11/26/2014   GLUCOSE 101* 11/26/2014   BUN 12 11/26/2014   CREATININE 0.68 11/26/2014   BILITOT 0.4 11/26/2014   ALKPHOS 112 11/26/2014   AST 21 11/26/2014   ALT 23 11/26/2014   PROT 7.9 11/26/2014   ALBUMIN 3.9 11/26/2014   CALCIUM 9.1 11/26/2014   ANIONGAP 8 11/26/2014   GFR 98.93 06/04/2014   Lab Results  Component Value Date  CHOL 180 02/17/2014   Lab Results  Component Value Date   HDL 78.60 02/17/2014   Lab Results  Component Value Date   LDLCALC 79 02/17/2014   Lab Results  Component Value Date   TRIG 112.0 02/17/2014   Lab Results  Component Value Date   CHOLHDL 2 02/17/2014   No results found for: HGBA1C     Assessment & Plan:   Problem List Items Addressed This Visit    TOBACCO ABUSE    Encouraged complete cessation. Discussed need to quit as relates to risk of numerous cancers, cardiac and pulmonary disease as well as neurologic complications. Counseled for greater than 3 minutes.       Rheumatoid arthritis (HCC)   Relevant Medications   oxyCODONE-acetaminophen (PERCOCET) 10-325 MG tablet   fentaNYL (DURAGESIC - DOSED MCG/HR) 100 MCG/HR   Pain   Relevant Medications   oxyCODONE-acetaminophen (PERCOCET) 10-325 MG tablet   Obesity    Encouraged DASH diet, decrease po intake and increase exercise as tolerated. Needs 7-8 hours of sleep nightly. Avoid trans fats, eat small, frequent meals every 4-5 hours with lean proteins, complex carbs and healthy fats. Minimize simple carbs, GMO foods.      LOW BACK PAIN    Manages with current meds and activity as tolerated      Relevant Medications   oxyCODONE-acetaminophen (PERCOCET) 10-325 MG tablet   fentaNYL (DURAGESIC - DOSED MCG/HR) 100 MCG/HR   Hypertension    Well controlled, no changes to meds. Encouraged heart healthy diet such as the DASH diet and exercise as tolerated.        Other Visit Diagnoses    Breast cancer screening    -  Primary    Relevant Orders    MM  Digital Screening    Cervical cancer screening        Relevant Orders    Ambulatory referral to Gynecology    Chest pain, unspecified chest pain type        Relevant Orders    DG Chest 2 View       I have changed Jennifer Kelly's oxyCODONE-acetaminophen and fentaNYL. I am also having her maintain her abatacept, ibuprofen, predniSONE, losartan-hydrochlorothiazide, gabapentin, diltiazem, citalopram, beclomethasone, albuterol, and hyoscyamine.  Meds ordered this encounter  Medications  . oxyCODONE-acetaminophen (PERCOCET) 10-325 MG tablet    Sig: Take 2 tablets by mouth every 4 (four) hours as needed for pain. Can be filled on or after 03/16/2015    Dispense:  180 tablet    Refill:  0    DISCONTINUE ALL PREVIOUS REFILLS FOR THIS MEDICATION  . fentaNYL (DURAGESIC - DOSED MCG/HR) 100 MCG/HR    Sig: Place 1 patch (100 mcg total) onto the skin every other day. Can be filled on or after 03/16/2015    Dispense:  15 patch    Refill:  0    DISCONTINUE ALL PREVIOUS REFILLS FOR THIS MEDICATION     Penni Homans, MD

## 2015-03-14 NOTE — Assessment & Plan Note (Signed)
Encouraged DASH diet, decrease po intake and increase exercise as tolerated. Needs 7-8 hours of sleep nightly. Avoid trans fats, eat small, frequent meals every 4-5 hours with lean proteins, complex carbs and healthy fats. Minimize simple carbs, GMO foods. 

## 2015-03-14 NOTE — Assessment & Plan Note (Signed)
Manages with current meds and activity as tolerated

## 2015-03-15 NOTE — Telephone Encounter (Signed)
Patient informed. 

## 2015-03-15 NOTE — Telephone Encounter (Signed)
I saw this over the weekend so I sent it to the pharmacy myself and sent the patient a my chart message

## 2015-03-26 ENCOUNTER — Other Ambulatory Visit: Payer: Self-pay | Admitting: Family Medicine

## 2015-04-12 ENCOUNTER — Other Ambulatory Visit: Payer: Self-pay | Admitting: Family Medicine

## 2015-04-12 DIAGNOSIS — R52 Pain, unspecified: Secondary | ICD-10-CM

## 2015-04-12 DIAGNOSIS — M069 Rheumatoid arthritis, unspecified: Secondary | ICD-10-CM

## 2015-04-12 MED ORDER — OXYCODONE-ACETAMINOPHEN 10-325 MG PO TABS: 2.0000 | ORAL_TABLET | ORAL | Status: DC | PRN

## 2015-04-12 MED ORDER — FENTANYL 100 MCG/HR TD PT72: 100.0000 ug | MEDICATED_PATCH | TRANSDERMAL | Status: DC

## 2015-04-12 NOTE — Telephone Encounter (Signed)
Caller name: Bettey   Relationship to patient:  Self   Can be reached: [phone]  Reason for call: Pt is requesting a refill on 2 medications, 1 Oxycodone and 2 Fentanyl .Marland Kitchen Pt says that she would like to have them ready before Wednesday before she goes out of town.

## 2015-04-12 NOTE — Telephone Encounter (Signed)
Requesting:  Oxycodone and Fentanyl Contract  Signed on 02/13/14 UDS   Low Risk Last OV  03/08/2015 Last Refill  Fentanyl  #15 03/08/15                    Oxycodone  #180 with 0 refills on 03/08/15   Please Advise

## 2015-04-13 NOTE — Telephone Encounter (Signed)
Called the patient left a detailed message that hardcopy of oxycodone and fentanyl patch ready for pickup at the front desk.

## 2015-05-07 ENCOUNTER — Other Ambulatory Visit: Payer: Self-pay | Admitting: Family Medicine

## 2015-05-07 DIAGNOSIS — R52 Pain, unspecified: Secondary | ICD-10-CM

## 2015-05-07 DIAGNOSIS — M069 Rheumatoid arthritis, unspecified: Secondary | ICD-10-CM

## 2015-05-07 NOTE — Telephone Encounter (Signed)
Can be reached: [phone]  Reason for call: Pt needs refill on fentanyl patches and the oxycodone. Ok til next week but wants to pick up before she and Dr. Charlett Blake are out of town. Pt can pick up Monday or Tuesday. Please call when RXs are ready.

## 2015-05-07 NOTE — Telephone Encounter (Signed)
Requesting---:fentanyl patch and oxycodone Contract----  Yes on 02/13/2014 UDS---  Yes Low risk next not due until 08/2015 Last OV----  03/08/2015 Last Refill---  Fentanyl patch #15 with 0 refills on 04/12/2015                    Oxycodone  #180 with 0 refills on 04/12/2015  Please Advise

## 2015-05-08 NOTE — Telephone Encounter (Signed)
OK to refill both 

## 2015-05-10 MED ORDER — OXYCODONE-ACETAMINOPHEN 10-325 MG PO TABS: 2.0000 | ORAL_TABLET | ORAL | Status: DC | PRN

## 2015-05-10 MED ORDER — FENTANYL 100 MCG/HR TD PT72: 100.0000 ug | MEDICATED_PATCH | TRANSDERMAL | Status: DC

## 2015-05-10 NOTE — Telephone Encounter (Signed)
Printed and on counter for signature. 

## 2015-05-10 NOTE — Telephone Encounter (Signed)
Called the patient left a detailed message hardcopy of both are ready for pickup at the front desk.

## 2015-05-14 ENCOUNTER — Encounter: Payer: Self-pay | Admitting: Medical

## 2015-05-14 ENCOUNTER — Ambulatory Visit (INDEPENDENT_AMBULATORY_CARE_PROVIDER_SITE_OTHER): Payer: 59 | Admitting: Medical

## 2015-05-14 VITALS — BP 147/86 | HR 75 | Temp 98.0°F | Ht 72.0 in | Wt 252.2 lb

## 2015-05-14 DIAGNOSIS — J209 Acute bronchitis, unspecified: Secondary | ICD-10-CM | POA: Diagnosis not present

## 2015-05-14 DIAGNOSIS — R062 Wheezing: Secondary | ICD-10-CM

## 2015-05-14 DIAGNOSIS — Z720 Tobacco use: Secondary | ICD-10-CM | POA: Diagnosis not present

## 2015-05-14 DIAGNOSIS — Z87891 Personal history of nicotine dependence: Secondary | ICD-10-CM

## 2015-05-14 MED ORDER — METHYLPREDNISOLONE ACETATE 40 MG/ML IJ SUSP
40.0000 mg | Freq: Once | INTRAMUSCULAR | Status: AC
  Administered 2015-05-14: 40 mg via INTRAMUSCULAR

## 2015-05-14 MED ORDER — ALBUTEROL SULFATE HFA 108 (90 BASE) MCG/ACT IN AERS: 2.0000 | INHALATION_SPRAY | Freq: Four times a day (QID) | RESPIRATORY_TRACT | Status: DC | PRN

## 2015-05-14 MED ORDER — CEFTRIAXONE SODIUM 1 G IJ SOLR
1.0000 g | Freq: Once | INTRAMUSCULAR | Status: AC
  Administered 2015-05-14: 1 g via INTRAMUSCULAR

## 2015-05-14 MED ORDER — AMOXICILLIN-POT CLAVULANATE 875-125 MG PO TABS: 1.0000 | ORAL_TABLET | Freq: Two times a day (BID) | ORAL | Status: DC

## 2015-05-14 MED ORDER — PREDNISONE 20 MG PO TABS: ORAL_TABLET | ORAL | Status: DC

## 2015-05-14 MED ORDER — BENZONATATE 100 MG PO CAPS: 100.0000 mg | ORAL_CAPSULE | Freq: Three times a day (TID) | ORAL | Status: DC | PRN

## 2015-05-14 NOTE — Progress Notes (Signed)
Pre visit review using our clinic review tool, if applicable. No additional management support is needed unless otherwise documented below in the visit note. 

## 2015-05-14 NOTE — Patient Instructions (Addendum)
For your bronchitis with some concern potential pneumonia over long holiday weekend, we gave rocephin 1 gram. This coming Monday recommend holding orencia as your rheumatologist has advised. Also start augmentin today  and stop pen vk.  For wheezing rx 3 day prednisone and hold your daily 5 mg prednisone dose. Depo medrol 40 mg im in office.  Continue q-var. Refill albuterol.  Follow up in 5-6 days or as needed. If you worsen over weekend then ED evaluation.

## 2015-05-14 NOTE — Progress Notes (Signed)
Subjective:    Patient ID: Jennifer Kelly, female    DOB: 04/03/1961, 54 y.o.   MRN: [id number]  HPI  Pt in states she thinks she has bronchitis.  She stats over past 2 days mild congestion but then this am onset chest congestion  Moderate to severe. When she exhales has wheezy sound. Felt little sweaty yesterday. No sinus pressure. No ear pain. Pt gets bronchitis about one time a year.   Pt on orencia for her RA. Also pt on prednisone 5 mg a day. Pt knows when she gets sick that she will stop orencia until she feels better. So she plans to not get orencia on Monday.  Pt has qvar and albuterol inhaler.   Pt smokes 4 cigarettes a day.     Review of Systems  Constitutional: Negative for fever, chills and fatigue.  HENT: Positive for congestion. Negative for ear pain, facial swelling, postnasal drip, rhinorrhea, sinus pressure and sore throat.   Respiratory: Positive for cough and wheezing. Negative for chest tightness and shortness of breath.   Cardiovascular: Negative for chest pain and palpitations.  Gastrointestinal: Negative for abdominal pain.  Musculoskeletal: Negative for back pain.  Skin: Negative for rash.  Neurological: Negative for dizziness and headaches.  Hematological: Negative for adenopathy. Does not bruise/bleed easily.    Past Medical History  Diagnosis Date  . Arthritis   . Hypertension   . Shingles 2014  . Chicken pox as a child  . Mumps as a child  . History of chicken pox   . H/O mumps   . Acute bronchitis 01/05/2014  . Dermatitis 09/09/2014  . Ganglion cyst of left foot 09/09/2014    Social History   Social History  . Marital Status: Married    Spouse Name: N/A  . Number of Children: N/A  . Years of Education: N/A   Occupational History  . Not on file.   Social History Main Topics  . Smoking status: Current Every Day Smoker  . Smokeless tobacco: Not on file  . Alcohol Use: Yes     Comment: occ wine  . Drug Use: No  . Sexual Activity: No       Comment: no dietary restrictions. lives alone   Other Topics Concern  . Not on file   Social History Narrative    Past Surgical History  Procedure Laterality Date  . Tooth extraction  05/2012  . Wisdom tooth extraction  54 yrs old  . Back surgery  2003  . Rotater cuff  20 yrs ago    left shoulder    Family History  Problem Relation Age of Onset  . Parkinson's disease Mother   . Heart disease Mother   . Hypertension Mother   . Diabetes Father     type 2  . Hypertension Father   . Stroke Father   . Vascular Disease Brother   . Hypertension Brother   . Diabetes Brother   . Heart attack Paternal Uncle     No Known Allergies  Current Outpatient Prescriptions on File Prior to Visit  Medication Sig Dispense Refill  . abatacept (ORENCIA) 250 MG injection Inject into the vein once a week.    Marland Kitchen albuterol (VENTOLIN HFA) 108 (90 BASE) MCG/ACT inhaler inhale 2 puffs by mouth every 6 hours if needed for shortness of breath or wheezing 18 g 5  . amoxicillin (AMOXIL) 500 MG capsule Take 1 capsule (500 mg total) by mouth 3 (three) times daily. 30 capsule 0  .  beclomethasone (QVAR) 40 MCG/ACT inhaler Inhale 2 puffs into the lungs 2 (two) times daily at 10 AM and 5 PM. 1 Inhaler 2  . citalopram (CELEXA) 20 MG tablet Take 1 tablet (20 mg total) by mouth daily. 30 tablet 11  . diltiazem (CARDIZEM CD) 240 MG 24 hr capsule Take 1 capsule (240 mg total) by mouth daily. 30 capsule 11  . fentaNYL (DURAGESIC - DOSED MCG/HR) 100 MCG/HR Place 1 patch (100 mcg total) onto the skin every other day. Can be filled on or after 03/16/2015 15 patch 0  . gabapentin (NEURONTIN) 600 MG tablet take 1 tablet by mouth three times a day 90 tablet 4  . hyoscyamine (LEVSIN SL) 0.125 MG SL tablet Place 1 tablet (0.125 mg total) under the tongue every 4 (four) hours as needed. 30 tablet 0  . ibuprofen (ADVIL,MOTRIN) 800 MG tablet     . losartan-hydrochlorothiazide (HYZAAR) 50-12.5 MG per tablet Take 1 tablet by  mouth daily. 30 tablet 5  . oxyCODONE-acetaminophen (PERCOCET) 10-325 MG tablet Take 2 tablets by mouth every 4 (four) hours as needed for pain. Can be filled on or after 03/16/2015 180 tablet 0  . predniSONE (DELTASONE) 5 MG tablet Take 1 tablet (5 mg total) by mouth daily with breakfast.     No current facility-administered medications on file prior to visit.    BP 147/86 mmHg  Pulse 75  Temp(Src) 98 F (36.7 C) (Oral)  Ht 6' (1.829 m)  Wt 252 lb 3.2 oz (114.397 kg)  BMI 34.20 kg/m2  SpO2 98%       Objective:   Physical Exam  General  Mental Status - Alert. General Appearance - Well groomed. Not in acute distress.  Skin Rashes- No Rashes.  HEENT Head- Normal. Ear Auditory Canal - Left- Normal. Right - Normal.Tympanic Membrane- Left- moderate dull Right- Normal. Eye Sclera/Conjunctiva- Left- Normal. Right- Normal. Nose & Sinuses Nasal Mucosa- Left-  Boggy and Congested. Right-  Boggy and  Congested.Bilateral no maxillary and no  frontal sinus pressure. Mouth & Throat Lips: Upper Lip- Normal: no dryness, cracking, pallor, cyanosis, or vesicular eruption. Lower Lip-Normal: no dryness, cracking, pallor, cyanosis or vesicular eruption. Buccal Mucosa- Bilateral- No Aphthous ulcers. Oropharynx- No Discharge or Erythema. Tonsils: Characteristics- Bilateral- No Erythema or Congestion. Size/Enlargement- Bilateral- No enlargement. Discharge- bilateral-None.  Neck Neck- Supple. No Masses.   Chest and Lung Exam Auscultation: Breath Sounds:-even and unlabored. But mild scatterd wheezing. Left lower lobe faint  rhonchi.  Cardiovascular Auscultation:Rythm- Regular, rate and rhythm. Murmurs & Other Heart Sounds:Ausculatation of the heart reveal- No Murmurs.  Lymphatic Head & Neck General Head & Neck Lymphatics: Bilateral: Description- No Localized lymphadenopathy.       Assessment & Plan:  For your bronchitis with some concern potential pneumonia over long holiday  weekend, we gave rocephin 1 gram. This coming Monday recommend holding orencia as your rheumatologist has advised. Also start augmentin today and stop pen vk.  For wheezing rx 3 day prednisone and hold your daily 5 mg prednisone dose. Depo medrol 40 mg im in office.  Continue q-var. Refill albuterol.  Follow up in 5-6 days or as needed. If you worsen over weekend then ED evaluation.

## 2015-05-20 ENCOUNTER — Other Ambulatory Visit: Payer: 59

## 2015-06-03 ENCOUNTER — Encounter: Payer: 59 | Admitting: Family Medicine

## 2015-06-04 ENCOUNTER — Other Ambulatory Visit: Payer: Self-pay | Admitting: Family Medicine

## 2015-06-09 ENCOUNTER — Telehealth: Payer: Self-pay | Admitting: Family Medicine

## 2015-06-09 DIAGNOSIS — M069 Rheumatoid arthritis, unspecified: Secondary | ICD-10-CM

## 2015-06-09 DIAGNOSIS — R52 Pain, unspecified: Secondary | ICD-10-CM

## 2015-06-09 NOTE — Telephone Encounter (Signed)
Last office visit 05/14/15 Last filled 04/30/15 #15 patches with 0 refills  #180 tablets with 0 refills Last UDS 06/04/14

## 2015-06-09 NOTE — Telephone Encounter (Signed)
Pt needing refills on oxycodone and fentanyl. Has 1-2 days. Please call pt when RXs are written at [phone]. Pts friend Loran Senters will pick up the RXs from our office bc she is working out of town after hours and can't get here by 5-7pm.

## 2015-06-10 ENCOUNTER — Telehealth: Payer: Self-pay | Admitting: Family Medicine

## 2015-06-10 MED ORDER — FENTANYL 100 MCG/HR TD PT72: 100.0000 ug | MEDICATED_PATCH | TRANSDERMAL | Status: DC

## 2015-06-10 MED ORDER — OXYCODONE-ACETAMINOPHEN 10-325 MG PO TABS: 2.0000 | ORAL_TABLET | ORAL | Status: DC | PRN

## 2015-06-10 NOTE — Telephone Encounter (Signed)
Printed both hardcopy's and on counter for signature by PCP. Called the patient at the number in phone note and left a detailed message hardcopy's of both are ready for pickup at the front desk.

## 2015-06-10 NOTE — Telephone Encounter (Signed)
OK to refill both meds

## 2015-06-10 NOTE — Telephone Encounter (Signed)
Pt sent her boss Mr. Despina Arias to pick up her Rx.

## 2015-06-18 ENCOUNTER — Other Ambulatory Visit: Payer: Self-pay | Admitting: Medical

## 2015-06-24 ENCOUNTER — Ambulatory Visit (HOSPITAL_BASED_OUTPATIENT_CLINIC_OR_DEPARTMENT_OTHER): Payer: 59

## 2015-06-29 ENCOUNTER — Ambulatory Visit (HOSPITAL_BASED_OUTPATIENT_CLINIC_OR_DEPARTMENT_OTHER): Payer: 59

## 2015-07-01 ENCOUNTER — Ambulatory Visit (HOSPITAL_BASED_OUTPATIENT_CLINIC_OR_DEPARTMENT_OTHER)
Admission: RE | Admit: 2015-07-01 | Discharge: 2015-07-01 | Disposition: A | Discharge status: Home / Self Care | Payer: 59 | Source: Ambulatory Visit | Attending: Family Medicine | Admitting: Family Medicine

## 2015-07-01 DIAGNOSIS — R079 Chest pain, unspecified: Secondary | ICD-10-CM | POA: Insufficient documentation

## 2015-07-01 DIAGNOSIS — F17200 Nicotine dependence, unspecified, uncomplicated: Secondary | ICD-10-CM | POA: Insufficient documentation

## 2015-07-01 DIAGNOSIS — Z1231 Encounter for screening mammogram for malignant neoplasm of breast: Secondary | ICD-10-CM | POA: Diagnosis not present

## 2015-07-01 DIAGNOSIS — R918 Other nonspecific abnormal finding of lung field: Secondary | ICD-10-CM | POA: Diagnosis not present

## 2015-07-01 DIAGNOSIS — Z1239 Encounter for other screening for malignant neoplasm of breast: Secondary | ICD-10-CM

## 2015-07-07 ENCOUNTER — Telehealth: Payer: Self-pay | Admitting: Family Medicine

## 2015-07-07 DIAGNOSIS — M069 Rheumatoid arthritis, unspecified: Secondary | ICD-10-CM

## 2015-07-07 DIAGNOSIS — R52 Pain, unspecified: Secondary | ICD-10-CM

## 2015-07-07 NOTE — Telephone Encounter (Signed)
Caller name: Melessa   Relationship to patient: Self   Can be reached: [phone]   Reason for call: pt is requesting a refill on 2 Rx.  1. fentaNYL  2. Oxycodone.  Please call pt when ready for pick up. She says that she may send her boss for pick up .

## 2015-07-07 NOTE — Telephone Encounter (Signed)
Please refill requested meds

## 2015-07-07 NOTE — Telephone Encounter (Signed)
Requesting Fentanyl (Duragesic-Dosed) 100 mcg/HR-Placed 1 patch onto the skin every other day.                    Oxycodone 10-325mg -Take 2 tablets by mouth every 4 hours as needed for pain. Last refill:06/10/15;#15,0                 06/10/15;#180,0 Last OV:05/14/15 w/Edward UDS:02/17/15-Low risk-Next screen:08/17/15 Please advise.//AB/CMA

## 2015-07-08 MED ORDER — FENTANYL 100 MCG/HR TD PT72: 100.0000 ug | MEDICATED_PATCH | TRANSDERMAL | Status: DC

## 2015-07-08 MED ORDER — OXYCODONE-ACETAMINOPHEN 10-325 MG PO TABS: 2.0000 | ORAL_TABLET | ORAL | Status: DC | PRN

## 2015-07-08 MED FILL — fentaNYL 100 MCG/HR PT72: 100 | 30 days supply | Qty: 15 | Fill #0

## 2015-07-08 MED FILL — OXYCODONE-APAP 10-325 TAB: 10-325 | 15 days supply | Qty: 180 | Fill #0

## 2015-07-08 NOTE — Telephone Encounter (Signed)
Printed both as PCP approved and on counter for signature. Called the patient left a detailed message hardcopy's are ready for pickup at the front desk.

## 2015-07-20 MED ORDER — ALBUTEROL SULFATE HFA 108 (90 BASE) MCG/ACT IN AERS: INHALATION_SPRAY | RESPIRATORY_TRACT | Status: DC

## 2015-07-20 NOTE — Telephone Encounter (Signed)
Pt called in to check the status of her Rx. Please call back to discuss further.    CB: [phone]

## 2015-07-20 NOTE — Addendum Note (Signed)
Addended by: Sharon Seller B on: 07/20/2015 03:29 PM   Modules accepted: Orders

## 2015-07-20 NOTE — Telephone Encounter (Signed)
Called the patient and she was requesting her Ventolin Inhaler to be refilled. Refill done as patient requested.

## 2015-08-04 ENCOUNTER — Other Ambulatory Visit: Payer: Self-pay | Admitting: Family Medicine

## 2015-08-04 DIAGNOSIS — R52 Pain, unspecified: Secondary | ICD-10-CM

## 2015-08-04 DIAGNOSIS — M069 Rheumatoid arthritis, unspecified: Secondary | ICD-10-CM

## 2015-08-04 NOTE — Telephone Encounter (Signed)
Caller name: Self  Can be reached: [phone]   Reason for call: Refill oxyCODONE-acetaminophen (PERCOCET) 10-325 MG tablet [id number] and fentaNYL (DURAGESIC - DOSED MCG/HR) 100 MCG/HR [id number]   States that Loran Senters will be picking rx's up for her

## 2015-08-05 MED ORDER — FENTANYL 100 MCG/HR TD PT72: 100.0000 ug | MEDICATED_PATCH | TRANSDERMAL | Status: DC

## 2015-08-05 MED ORDER — OXYCODONE-ACETAMINOPHEN 10-325 MG PO TABS: 2.0000 | ORAL_TABLET | ORAL | Status: DC | PRN

## 2015-08-05 NOTE — Telephone Encounter (Signed)
PCP signed/refilled both and patient notified both are ready for pickup at the front desk.

## 2015-08-05 NOTE — Telephone Encounter (Signed)
Requesting:  Oxycodone and fentanyl Contract  02/13/2014 UDS  Low risk Last OV   03/08/2015 Last Refill  Fentanyl #15 0 refills on 07/08/2015                    Oxycodone  #180 0 refills 07/08/2015  Please Advise

## 2015-09-02 ENCOUNTER — Telehealth: Payer: Self-pay | Admitting: Family Medicine

## 2015-09-02 DIAGNOSIS — M069 Rheumatoid arthritis, unspecified: Secondary | ICD-10-CM

## 2015-09-02 DIAGNOSIS — R52 Pain, unspecified: Secondary | ICD-10-CM

## 2015-09-02 MED ORDER — FENTANYL 100 MCG/HR TD PT72: 100.0000 ug | MEDICATED_PATCH | TRANSDERMAL | Status: DC

## 2015-09-02 MED ORDER — OXYCODONE-ACETAMINOPHEN 10-325 MG PO TABS: 2.0000 | ORAL_TABLET | ORAL | Status: DC | PRN

## 2015-09-02 NOTE — Telephone Encounter (Signed)
Received RF request for percocet and fentanyl  [facility]: last rx oxycodone filled 3/16, last rx fentanyl also 3/16 No other prescribers noted.  Will RF.  Called and let her know   Meds ordered this encounter  Medications  . oxyCODONE-acetaminophen (PERCOCET) 10-325 MG tablet    Sig: Take 2 tablets by mouth every 4 (four) hours as needed for pain.    Dispense:  180 tablet    Refill:  0    DISCONTINUE ALL PREVIOUS REFILLS FOR THIS MEDICATION  . fentaNYL (DURAGESIC - DOSED MCG/HR) 100 MCG/HR    Sig: Place 1 patch (100 mcg total) onto the skin every other day.    Dispense:  15 patch    Refill:  0    DISCONTINUE ALL PREVIOUS REFILLS FOR THIS MEDICATION

## 2015-09-02 NOTE — Telephone Encounter (Signed)
Relation to [id number] Call back number:[phone]   Reason for call:  Patient requesting a refill oxyCODONE-acetaminophen (PERCOCET) 10-325 MG tablet and fentaNYL (DURAGESIC - DOSED MCG/HR) 100 MCG/HR

## 2015-09-02 NOTE — Telephone Encounter (Signed)
Pt is requesting refill on Oxycodone and Fentanyl. Dr. Frederik Pear Pt.  Last OV: 03/08/2015 Last Fill on Oxycodone: 08/05/2015 #180 and 0RF Pt sig: 2 tablet q4h PRN Last Fill on Fentanyl: 08/05/2015 #15 patches and 0RF  Please advise.

## 2015-09-22 ENCOUNTER — Other Ambulatory Visit: Payer: Self-pay | Admitting: Family Medicine

## 2015-09-28 ENCOUNTER — Other Ambulatory Visit: Payer: Self-pay | Admitting: Family Medicine

## 2015-09-28 DIAGNOSIS — R52 Pain, unspecified: Secondary | ICD-10-CM

## 2015-09-28 DIAGNOSIS — M069 Rheumatoid arthritis, unspecified: Secondary | ICD-10-CM

## 2015-09-28 NOTE — Telephone Encounter (Signed)
Requesting:  Oxycodone and fentanyl Contract  02/13/2014 UDS  02/17/2015 low risk, due Last OV   03/08/2015 Last Refill  Fentanyl patch #15  09/02/2015                   Oxycodone  #180  09/02/2015  Please Advise

## 2015-09-28 NOTE — Telephone Encounter (Signed)
Can have refills at end of week

## 2015-09-28 NOTE — Telephone Encounter (Signed)
Pt called in to request a refill on 2 medications. Pt says that she know that they are not due for refill yet. She would like to know if provider could just make them available for pick up now and date them for the correct "refill date"   1. fentaNYL  2. Oxycodone    CB: [phone]

## 2015-09-29 MED ORDER — OXYCODONE-ACETAMINOPHEN 10-325 MG PO TABS: 2.0000 | ORAL_TABLET | ORAL | Status: DC | PRN

## 2015-09-29 MED ORDER — FENTANYL 100 MCG/HR TD PT72: 100.0000 ug | MEDICATED_PATCH | TRANSDERMAL | Status: DC

## 2015-09-29 NOTE — Telephone Encounter (Signed)
Rx printed with start date 10/01/15. Placed in PCP's red folder for signature.

## 2015-09-29 NOTE — Telephone Encounter (Signed)
Pt will be out of town and will be sending her boss to pick up Rx.

## 2015-09-30 NOTE — Telephone Encounter (Signed)
Called the patient informed both refills will be ready for pickup on Friday Oct 01, 2015 (left a detailed message0

## 2015-10-06 ENCOUNTER — Other Ambulatory Visit: Payer: Self-pay | Admitting: Family Medicine

## 2015-10-29 ENCOUNTER — Telehealth: Payer: Self-pay | Admitting: Family Medicine

## 2015-10-29 DIAGNOSIS — R52 Pain, unspecified: Secondary | ICD-10-CM

## 2015-10-29 DIAGNOSIS — M069 Rheumatoid arthritis, unspecified: Secondary | ICD-10-CM

## 2015-10-29 MED ORDER — OXYCODONE-ACETAMINOPHEN 10-325 MG PO TABS: 2.0000 | ORAL_TABLET | ORAL | Status: DC | PRN

## 2015-10-29 MED ORDER — FENTANYL 100 MCG/HR TD PT72: 100.0000 ug | MEDICATED_PATCH | TRANSDERMAL | Status: DC

## 2015-10-29 NOTE — Telephone Encounter (Signed)
OK to fill both meds

## 2015-10-29 NOTE — Telephone Encounter (Signed)
Printed and on counter for signature. Patient informed.

## 2015-10-29 NOTE — Telephone Encounter (Signed)
Patient called back to check status states she will have a hard time coming to pick it up after 2:30 due to not being fully staffed at work

## 2015-10-29 NOTE — Telephone Encounter (Signed)
Pt called in to request a refill on 2 medications.   1. Oxycodone   2. Fentanyl  Pt says that she will be out over the weekend. Please inform when ready for pickup. I will call pt to make her aware.

## 2015-10-29 NOTE — Telephone Encounter (Signed)
Both filled on 10/01/2015 Fentanyl #15 Oxycodone #180 Last Office visit---03/08/2015 Contract--02/13/2014 UDS---Low Risk, was due on 08/17/2015

## 2015-11-04 ENCOUNTER — Other Ambulatory Visit: Payer: Self-pay | Admitting: Family Medicine

## 2015-11-16 ENCOUNTER — Other Ambulatory Visit (INDEPENDENT_AMBULATORY_CARE_PROVIDER_SITE_OTHER): Payer: 59

## 2015-11-17 ENCOUNTER — Other Ambulatory Visit: Payer: Self-pay | Admitting: Family Medicine

## 2015-11-25 ENCOUNTER — Telehealth: Payer: Self-pay | Admitting: Family Medicine

## 2015-11-25 DIAGNOSIS — R52 Pain, unspecified: Secondary | ICD-10-CM

## 2015-11-25 DIAGNOSIS — M069 Rheumatoid arthritis, unspecified: Secondary | ICD-10-CM

## 2015-11-25 MED ORDER — FENTANYL 100 MCG/HR TD PT72: 100.0000 ug | MEDICATED_PATCH | TRANSDERMAL | Status: DC

## 2015-11-25 MED ORDER — OXYCODONE-ACETAMINOPHEN 10-325 MG PO TABS: 2.0000 | ORAL_TABLET | ORAL | Status: DC | PRN

## 2015-11-25 NOTE — Telephone Encounter (Signed)
Printed both scripts and on counter for signature. Called the patient (no answer, left a detailed message hardcopy's of both are ready for pickup at the front desk.)

## 2015-11-25 NOTE — Telephone Encounter (Signed)
°  Relationship to patient: Self Can be reached:[phone]    Reason for call: Request refills on oxyCODONE-acetaminophen (PERCOCET) 10-325 MG tablet [id number] and fentaNYL (DURAGESIC - DOSED MCG/HR) 100 MCG/HR [id number]

## 2015-11-25 NOTE — Telephone Encounter (Signed)
Last OV: 05/14/15 UDS: 02/17/15, positive for Fentanyl and Oxy, low risk  PERCOCET Last filled: 10/29/15, #180, 0 RF Sig: Take 2 tablets by mouth every 4 (four) hours as needed for pain.  FENTANYL Last filled: 10/29/15, #15, 0 RF Sig: Place 1 patch (100 mcg total) onto the skin every other day.

## 2015-11-25 NOTE — Telephone Encounter (Signed)
OK to refill both meds she has an appt next week

## 2015-11-26 ENCOUNTER — Other Ambulatory Visit (INDEPENDENT_AMBULATORY_CARE_PROVIDER_SITE_OTHER): Payer: 59

## 2015-11-26 DIAGNOSIS — I10 Essential (primary) hypertension: Secondary | ICD-10-CM | POA: Diagnosis not present

## 2015-11-26 DIAGNOSIS — R739 Hyperglycemia, unspecified: Secondary | ICD-10-CM | POA: Diagnosis not present

## 2015-11-26 LAB — CBC
HCT: 42.9 % (ref 36.0–46.0)
Hemoglobin: 14.5 g/dL (ref 12.0–15.0)
MCHC: 33.9 g/dL (ref 30.0–36.0)
MCV: 86.8 fl (ref 78.0–100.0)
PLATELETS: 248 10*3/uL (ref 150.0–400.0)
RBC: 4.95 Mil/uL (ref 3.87–5.11)
RDW: 14.4 % (ref 11.5–15.5)
WBC: 13.4 10*3/uL — ABNORMAL HIGH (ref 4.0–10.5)

## 2015-11-26 LAB — LIPID PANEL
CHOLESTEROL: 146 mg/dL (ref 0–200)
HDL: 50.3 mg/dL (ref >39.00)
LDL Cholesterol: 81 mg/dL (ref 0–99)
NonHDL: 95.62
TRIGLYCERIDES: 74 mg/dL (ref 0.0–149.0)
Total CHOL/HDL Ratio: 3
VLDL: 14.8 mg/dL (ref 0.0–40.0)

## 2015-11-26 LAB — COMPREHENSIVE METABOLIC PANEL
ALBUMIN: 3.7 g/dL (ref 3.5–5.2)
ALK PHOS: 90 U/L (ref 39–117)
ALT: 18 U/L (ref 0–35)
AST: 14 U/L (ref 0–37)
BILIRUBIN TOTAL: 0.6 mg/dL (ref 0.2–1.2)
BUN: 15 mg/dL (ref 6–23)
CALCIUM: 9.4 mg/dL (ref 8.4–10.5)
CO2: 32 mEq/L (ref 19–32)
CREATININE: 0.61 mg/dL (ref 0.40–1.20)
Chloride: 100 mEq/L (ref 96–112)
GFR: 107.76 mL/min (ref >60.00)
Glucose, Bld: 117 mg/dL — ABNORMAL HIGH (ref 70–99)
Potassium: 3.9 mEq/L (ref 3.5–5.1)
Sodium: 138 mEq/L (ref 135–145)
TOTAL PROTEIN: 7.3 g/dL (ref 6.0–8.3)

## 2015-11-26 LAB — TSH: TSH: 2.57 u[IU]/mL (ref 0.35–4.50)

## 2015-11-26 MED FILL — OXYCODONE-APAP 10-325 TAB: 10-325 | 15 days supply | Qty: 180 | Fill #0

## 2015-11-30 ENCOUNTER — Ambulatory Visit (INDEPENDENT_AMBULATORY_CARE_PROVIDER_SITE_OTHER): Payer: 59 | Admitting: Family Medicine

## 2015-11-30 ENCOUNTER — Encounter: Payer: Self-pay | Admitting: Family Medicine

## 2015-11-30 VITALS — BP 122/82 | HR 71 | Temp 98.3°F | Ht 72.0 in | Wt 234.0 lb

## 2015-11-30 DIAGNOSIS — E669 Obesity, unspecified: Secondary | ICD-10-CM | POA: Diagnosis not present

## 2015-11-30 DIAGNOSIS — R739 Hyperglycemia, unspecified: Secondary | ICD-10-CM

## 2015-11-30 DIAGNOSIS — Z0000 Encounter for general adult medical examination without abnormal findings: Secondary | ICD-10-CM

## 2015-11-30 DIAGNOSIS — D72829 Elevated white blood cell count, unspecified: Secondary | ICD-10-CM

## 2015-11-30 DIAGNOSIS — I10 Essential (primary) hypertension: Secondary | ICD-10-CM

## 2015-11-30 DIAGNOSIS — Z124 Encounter for screening for malignant neoplasm of cervix: Secondary | ICD-10-CM

## 2015-11-30 DIAGNOSIS — F17200 Nicotine dependence, unspecified, uncomplicated: Secondary | ICD-10-CM | POA: Diagnosis not present

## 2015-11-30 DIAGNOSIS — M545 Low back pain: Secondary | ICD-10-CM

## 2015-11-30 HISTORY — DX: Hyperglycemia, unspecified: R73.9

## 2015-11-30 MED ORDER — ALBUTEROL SULFATE HFA 108 (90 BASE) MCG/ACT IN AERS: INHALATION_SPRAY | RESPIRATORY_TRACT | Status: DC

## 2015-11-30 NOTE — Assessment & Plan Note (Addendum)
Encouraged DASH diet, decrease po intake and increase exercise as tolerated. Needs 7-8 hours of sleep nightly. Avoid trans fats, eat small, frequent meals every 4-5 hours with lean proteins, complex carbs and healthy fats. Minimize simple carbs. Good weight loss with low fat diet, staying more active

## 2015-11-30 NOTE — Assessment & Plan Note (Signed)
hgba1c acceptable, minimize simple carbs. Increase exercise as tolerated. Continue current meds 

## 2015-11-30 NOTE — Assessment & Plan Note (Signed)
Encouraged complete cessation. Discussed need to quit as relates to risk of numerous cancers, cardiac and pulmonary disease as well as neurologic complications. Counseled for greater than 3 minutes 

## 2015-11-30 NOTE — Assessment & Plan Note (Signed)
Encouraged moist heat and gentle stretching as tolerated. May try NSAIDs and prescription meds as directed and report if symptoms worsen or seek immediate care. Continue pain meds.

## 2015-11-30 NOTE — Progress Notes (Signed)
Pre visit review using our clinic review tool, if applicable. No additional management support is needed unless otherwise documented below in the visit note. 

## 2015-11-30 NOTE — Progress Notes (Signed)
Patient ID: Jennifer Kelly, female   DOB: 04/13/1961, 55 y.o.   MRN: [id number]   Subjective:    Patient ID: Jennifer Kelly, female    DOB: 1961-03-26, 55 y.o.   MRN: [id number]  Chief Complaint  Patient presents with  . Annual Exam    HPI Patient is in today for annual exam. Feels well today, no recent illness or acute concerns. Still smoking about 1/4 ppd per pack. Did have bronchitis a couple of months ago but no respiratory symptoms at present. Denies CP/palp/SOB/HA/congestion/fevers/GI or GU c/o. Taking meds as prescribed. Coughs qhs and sputum is white.  Past Medical History  Diagnosis Date  . Arthritis   . Hypertension   . Shingles 2014  . Chicken pox as a child  . Mumps as a child  . History of chicken pox   . H/O mumps   . Acute bronchitis 01/05/2014  . Dermatitis 09/09/2014  . Ganglion cyst of left foot 09/09/2014  . Hyperglycemia 11/30/2015    Past Surgical History  Procedure Laterality Date  . Tooth extraction  05/2012  . Wisdom tooth extraction  55 yrs old  . Back surgery  2003  . Rotater cuff  20 yrs ago    left shoulder    Family History  Problem Relation Age of Onset  . Parkinson's disease Mother   . Heart disease Mother   . Hypertension Mother   . Diabetes Father     type 2  . Hypertension Father   . Stroke Father   . Vascular Disease Brother   . Hypertension Brother   . Diabetes Brother   . Heart attack Paternal Uncle     Social History   Social History  . Marital Status: Married    Spouse Name: N/A  . Number of Children: N/A  . Years of Education: N/A   Occupational History  . Not on file.   Social History Main Topics  . Smoking status: Current Every Day Smoker  . Smokeless tobacco: Not on file  . Alcohol Use: Yes     Comment: occ wine  . Drug Use: No  . Sexual Activity: No     Comment: no dietary restrictions. lives alone   Other Topics Concern  . Not on file   Social History Narrative    Outpatient Prescriptions Prior to  Visit  Medication Sig Dispense Refill  . abatacept (ORENCIA) 250 MG injection Inject into the vein once a week.    . citalopram (CELEXA) 20 MG tablet take 1 tablet by mouth once daily 30 tablet 11  . diltiazem (CARDIZEM CD) 240 MG 24 hr capsule take 1 capsule by mouth once daily 30 capsule 5  . fentaNYL (DURAGESIC - DOSED MCG/HR) 100 MCG/HR Place 1 patch (100 mcg total) onto the skin every other day. 15 patch 0  . gabapentin (NEURONTIN) 600 MG tablet take 1 tablet by mouth three times a day 90 tablet 4  . ibuprofen (ADVIL,MOTRIN) 800 MG tablet     . oxyCODONE-acetaminophen (PERCOCET) 10-325 MG tablet Take 2 tablets by mouth every 4 (four) hours as needed for pain. 180 tablet 0  . losartan-hydrochlorothiazide (HYZAAR) 50-12.5 MG tablet take 1 tablet by mouth once daily 30 tablet 5  . VENTOLIN HFA 108 (90 Base) MCG/ACT inhaler inhale 2 puffs by mouth every 6 hours if needed for wheezing FOR WHEEZING. 18 Inhaler 1  . amoxicillin (AMOXIL) 500 MG capsule Take 1 capsule (500 mg total) by mouth 3 (three) times daily.  30 capsule 0  . amoxicillin-clavulanate (AUGMENTIN) 875-125 MG tablet Take 1 tablet by mouth 2 (two) times daily. 20 tablet 0  . beclomethasone (QVAR) 40 MCG/ACT inhaler Inhale 2 puffs into the lungs 2 (two) times daily at 10 AM and 5 PM. 1 Inhaler 2  . benzonatate (TESSALON) 100 MG capsule Take 1 capsule (100 mg total) by mouth 3 (three) times daily as needed for cough. 21 capsule 0  . hyoscyamine (LEVSIN SL) 0.125 MG SL tablet Place 1 tablet (0.125 mg total) under the tongue every 4 (four) hours as needed. 30 tablet 0  . penicillin v potassium (VEETID) 500 MG tablet take 1 tablet by mouth four times a day UNTIL GONE  0  . predniSONE (DELTASONE) 20 MG tablet 1 tab po tid day 1, then 1 tab po bid day 2, then 1 tab po day 3 6 tablet 0   No facility-administered medications prior to visit.    No Known Allergies  Review of Systems  Constitutional: Negative for fever, chills and  malaise/fatigue.  HENT: Negative for congestion and hearing loss.   Eyes: Negative for discharge.  Respiratory: Negative for cough, sputum production and shortness of breath.   Cardiovascular: Negative for chest pain, palpitations and leg swelling.  Gastrointestinal: Negative for heartburn, nausea, vomiting, abdominal pain, diarrhea, constipation and blood in stool.  Genitourinary: Negative for dysuria, urgency, frequency and hematuria.  Musculoskeletal: Positive for myalgias and back pain. Negative for falls.  Skin: Negative for rash.  Neurological: Negative for dizziness, sensory change, loss of consciousness, weakness and headaches.  Endo/Heme/Allergies: Negative for environmental allergies. Does not bruise/bleed easily.  Psychiatric/Behavioral: Negative for depression and suicidal ideas. The patient is not nervous/anxious and does not have insomnia.        Objective:    Physical Exam  Constitutional: She is oriented to person, place, and time. She appears well-developed and well-nourished. No distress.  Waist circumference 45.5"  HENT:  Head: Normocephalic and atraumatic.  Eyes: Conjunctivae are normal.  Neck: Neck supple. No thyromegaly present.  Cardiovascular: Normal rate, regular rhythm and normal heart sounds.   No murmur heard. Pulmonary/Chest: Effort normal and breath sounds normal. No respiratory distress.  Abdominal: Soft. Bowel sounds are normal. She exhibits no distension and no mass. There is no tenderness.  Musculoskeletal: She exhibits no edema.  Lymphadenopathy:    She has no cervical adenopathy.  Neurological: She is alert and oriented to person, place, and time.  Skin: Skin is warm and dry.  Psychiatric: She has a normal mood and affect. Her behavior is normal.    BP 122/82 mmHg  Pulse 71  Temp(Src) 98.3 F (36.8 C) (Oral)  Ht 6' (1.829 m)  Wt 234 lb (106.142 kg)  BMI 31.73 kg/m2  SpO2 92% Wt Readings from Last 3 Encounters:  11/30/15 234 lb (106.142  kg)  05/14/15 252 lb 3.2 oz (114.397 kg)  03/08/15 248 lb 4 oz (112.605 kg)     Lab Results  Component Value Date   WBC 13.4* 11/26/2015   HGB 14.5 11/26/2015   HCT 42.9 11/26/2015   PLT 248.0 11/26/2015   GLUCOSE 117* 11/26/2015   CHOL 146 11/26/2015   TRIG 74.0 11/26/2015   HDL 50.30 11/26/2015   LDLCALC 81 11/26/2015   ALT 18 11/26/2015   AST 14 11/26/2015   NA 138 11/26/2015   K 3.9 11/26/2015   CL 100 11/26/2015   CREATININE 0.61 11/26/2015   BUN 15 11/26/2015   CO2 32 11/26/2015  TSH 2.57 11/26/2015    Lab Results  Component Value Date   TSH 2.57 11/26/2015   Lab Results  Component Value Date   WBC 13.4* 11/26/2015   HGB 14.5 11/26/2015   HCT 42.9 11/26/2015   MCV 86.8 11/26/2015   PLT 248.0 11/26/2015   Lab Results  Component Value Date   NA 138 11/26/2015   K 3.9 11/26/2015   CO2 32 11/26/2015   GLUCOSE 117* 11/26/2015   BUN 15 11/26/2015   CREATININE 0.61 11/26/2015   BILITOT 0.6 11/26/2015   ALKPHOS 90 11/26/2015   AST 14 11/26/2015   ALT 18 11/26/2015   PROT 7.3 11/26/2015   ALBUMIN 3.7 11/26/2015   CALCIUM 9.4 11/26/2015   ANIONGAP 8 11/26/2014   GFR 107.76 11/26/2015   Lab Results  Component Value Date   CHOL 146 11/26/2015   Lab Results  Component Value Date   HDL 50.30 11/26/2015   Lab Results  Component Value Date   LDLCALC 81 11/26/2015   Lab Results  Component Value Date   TRIG 74.0 11/26/2015   Lab Results  Component Value Date   CHOLHDL 3 11/26/2015   No results found for: HGBA1C     Assessment & Plan:   Problem List Items Addressed This Visit    Obesity    Encouraged DASH diet, decrease po intake and increase exercise as tolerated. Needs 7-8 hours of sleep nightly. Avoid trans fats, eat small, frequent meals every 4-5 hours with lean proteins, complex carbs and healthy fats. Minimize simple carbs. Good weight loss with low fat diet, staying more active      TOBACCO ABUSE    Encouraged complete cessation.  Discussed need to quit as relates to risk of numerous cancers, cardiac and pulmonary disease as well as neurologic complications. Counseled for greater than 3 minutes      LOW BACK PAIN    Encouraged moist heat and gentle stretching as tolerated. May try NSAIDs and prescription meds as directed and report if symptoms worsen or seek immediate care. Continue pain meds.       Relevant Medications   predniSONE (DELTASONE) 5 MG tablet   Routine health maintenance    Patient encouraged to maintain heart healthy diet, regular exercise, adequate sleep. Consider daily probiotics. Take medications as prescribed. Given and reviewed copy of ACP documents from [employer] and encouraged to complete and return. Has cologuard ready to go. Referred to GYN for annual exam      Hypertension - Primary    Well controlled, no changes to meds. Encouraged heart healthy diet such as the DASH diet and exercise as tolerated.       Leukocytosis    Stable, mild asymptomatic, report worsening symptoms      Hyperglycemia    hgba1c acceptable, minimize si.mple carbs. Increase exercise as tolerated. Continue current meds.      Relevant Orders   Hemoglobin A1c    Other Visit Diagnoses    Cervical cancer screening        Relevant Orders    Ambulatory referral to Obstetrics / Gynecology       I have discontinued Ms. Edenfield's beclomethasone, hyoscyamine, amoxicillin, penicillin v potassium, amoxicillin-clavulanate, and benzonatate. I have also changed her VENTOLIN HFA to albuterol. Additionally, I am having her maintain her abatacept, ibuprofen, diltiazem, citalopram, gabapentin, oxyCODONE-acetaminophen, fentaNYL, and predniSONE.  Meds ordered this encounter  Medications  . predniSONE (DELTASONE) 5 MG tablet    Sig: Take 1 tablet by mouth  daily.    Refill:  0  . albuterol (VENTOLIN HFA) 108 (90 Base) MCG/ACT inhaler    Sig: inhale 2 puffs by mouth every 6 hours if needed for wheezing FOR WHEEZING.      Dispense:  18 Inhaler    Refill:  8     Penni Homans, MD

## 2015-11-30 NOTE — Assessment & Plan Note (Addendum)
Patient encouraged to maintain heart healthy diet, regular exercise, adequate sleep. Consider daily probiotics. Take medications as prescribed. Given and reviewed copy of ACP documents from [employer] and encouraged to complete and return. Has cologuard ready to go. Referred to GYN for annual exam

## 2015-11-30 NOTE — Patient Instructions (Signed)
seborrhae keratosis   Preventive Care for Adults, Female A healthy lifestyle and preventive care can promote health and wellness. Preventive health guidelines for women include the following key practices.  A routine yearly physical is a good way to check with your health care provider about your health and preventive screening. It is a chance to share any concerns and updates on your health and to receive a thorough exam.  Visit your dentist for a routine exam and preventive care every 6 months. Brush your teeth twice a day and floss once a day. Good oral hygiene prevents tooth decay and gum disease.  The frequency of eye exams is based on your age, health, family medical history, use of contact lenses, and other factors. Follow your health care provider's recommendations for frequency of eye exams.  Eat a healthy diet. Foods like vegetables, fruits, whole grains, low-fat dairy products, and lean protein foods contain the nutrients you need without too many calories. Decrease your intake of foods high in solid fats, added sugars, and salt. Eat the right amount of calories for you.Get information about a proper diet from your health care provider, if necessary.  Regular physical exercise is one of the most important things you can do for your health. Most adults should get at least 150 minutes of moderate-intensity exercise (any activity that increases your heart rate and causes you to sweat) each week. In addition, most adults need muscle-strengthening exercises on 2 or more days a week.  Maintain a healthy weight. The body mass index (BMI) is a screening tool to identify possible weight problems. It provides an estimate of body fat based on height and weight. Your health care provider can find your BMI and can help you achieve or maintain a healthy weight.For adults 20 years and older:  A BMI below 18.5 is considered underweight.  A BMI of 18.5 to 24.9 is normal.  A BMI of 25 to 29.9 is  considered overweight.  A BMI of 30 and above is considered obese.  Maintain normal blood lipids and cholesterol levels by exercising and minimizing your intake of saturated fat. Eat a balanced diet with plenty of fruit and vegetables. Blood tests for lipids and cholesterol should begin at age 85 and be repeated every 5 years. If your lipid or cholesterol levels are high, you are over 50, or you are at high risk for heart disease, you may need your cholesterol levels checked more frequently.Ongoing high lipid and cholesterol levels should be treated with medicines if diet and exercise are not working.  If you smoke, find out from your health care provider how to quit. If you do not use tobacco, do not start.  Lung cancer screening is recommended for adults aged 5-80 years who are at high risk for developing lung cancer because of a history of smoking. A yearly low-dose CT scan of the lungs is recommended for people who have at least a 30-pack-year history of smoking and are a current smoker or have quit within the past 15 years. A pack year of smoking is smoking an average of 1 pack of cigarettes a day for 1 year (for example: 1 pack a day for 30 years or 2 packs a day for 15 years). Yearly screening should continue until the smoker has stopped smoking for at least 15 years. Yearly screening should be stopped for people who develop a health problem that would prevent them from having lung cancer treatment.  If you are pregnant, do not drink  alcohol. If you are breastfeeding, be very cautious about drinking alcohol. If you are not pregnant and choose to drink alcohol, do not have more than 1 drink per day. One drink is considered to be 12 ounces (355 mL) of beer, 5 ounces (148 mL) of wine, or 1.5 ounces (44 mL) of liquor.  Avoid use of street drugs. Do not share needles with anyone. Ask for help if you need support or instructions about stopping the use of drugs.  High blood pressure causes heart  disease and increases the risk of stroke. Your blood pressure should be checked at least every 1 to 2 years. Ongoing high blood pressure should be treated with medicines if weight loss and exercise do not work.  If you are 75-48 years old, ask your health care provider if you should take aspirin to prevent strokes.  Diabetes screening is done by taking a blood sample to check your blood glucose level after you have not eaten for a certain period of time (fasting). If you are not overweight and you do not have risk factors for diabetes, you should be screened once every 3 years starting at age 21. If you are overweight or obese and you are 67-59 years of age, you should be screened for diabetes every year as part of your cardiovascular risk assessment.  Breast cancer screening is essential preventive care for women. You should practice "breast self-awareness." This means understanding the normal appearance and feel of your breasts and may include breast self-examination. Any changes detected, no matter how small, should be reported to a health care provider. Women in their 71s and 30s should have a clinical breast exam (CBE) by a health care provider as part of a regular health exam every 1 to 3 years. After age 52, women should have a CBE every year. Starting at age 97, women should consider having a mammogram (breast X-ray test) every year. Women who have a family history of breast cancer should talk to their health care provider about genetic screening. Women at a high risk of breast cancer should talk to their health care providers about having an MRI and a mammogram every year.  Breast cancer gene (BRCA)-related cancer risk assessment is recommended for women who have family members with BRCA-related cancers. BRCA-related cancers include breast, ovarian, tubal, and peritoneal cancers. Having family members with these cancers may be associated with an increased risk for harmful changes (mutations) in the  breast cancer genes BRCA1 and BRCA2. Results of the assessment will determine the need for genetic counseling and BRCA1 and BRCA2 testing.  Your health care provider may recommend that you be screened regularly for cancer of the pelvic organs (ovaries, uterus, and vagina). This screening involves a pelvic examination, including checking for microscopic changes to the surface of your cervix (Pap test). You may be encouraged to have this screening done every 3 years, beginning at age 45.  For women ages 62-65, health care providers may recommend pelvic exams and Pap testing every 3 years, or they may recommend the Pap and pelvic exam, combined with testing for human papilloma virus (HPV), every 5 years. Some types of HPV increase your risk of cervical cancer. Testing for HPV may also be done on women of any age with unclear Pap test results.  Other health care providers may not recommend any screening for nonpregnant women who are considered low risk for pelvic cancer and who do not have symptoms. Ask your health care provider if a screening pelvic  exam is right for you.  If you have had past treatment for cervical cancer or a condition that could lead to cancer, you need Pap tests and screening for cancer for at least 20 years after your treatment. If Pap tests have been discontinued, your risk factors (such as having a new sexual partner) need to be reassessed to determine if screening should resume. Some women have medical problems that increase the chance of getting cervical cancer. In these cases, your health care provider may recommend more frequent screening and Pap tests.  Colorectal cancer can be detected and often prevented. Most routine colorectal cancer screening begins at the age of 27 years and continues through age 54 years. However, your health care provider may recommend screening at an earlier age if you have risk factors for colon cancer. On a yearly basis, your health care provider may  provide home test kits to check for hidden blood in the stool. Use of a small camera at the end of a tube, to directly examine the colon (sigmoidoscopy or colonoscopy), can detect the earliest forms of colorectal cancer. Talk to your health care provider about this at age 65, when routine screening begins. Direct exam of the colon should be repeated every 5-10 years through age 25 years, unless early forms of precancerous polyps or small growths are found.  People who are at an increased risk for hepatitis B should be screened for this virus. You are considered at high risk for hepatitis B if:  You were born in a country where hepatitis B occurs often. Talk with your health care provider about which countries are considered high risk.  Your parents were born in a high-risk country and you have not received a shot to protect against hepatitis B (hepatitis B vaccine).  You have HIV or AIDS.  You use needles to inject street drugs.  You live with, or have sex with, someone who has hepatitis B.  You get hemodialysis treatment.  You take certain medicines for conditions like cancer, organ transplantation, and autoimmune conditions.  Hepatitis C blood testing is recommended for all people born from [birth year] through 1965 and any individual with known risks for hepatitis C.  Practice safe sex. Use condoms and avoid high-risk sexual practices to reduce the spread of sexually transmitted infections (STIs). STIs include gonorrhea, chlamydia, syphilis, trichomonas, herpes, HPV, and human immunodeficiency virus (HIV). Herpes, HIV, and HPV are viral illnesses that have no cure. They can result in disability, cancer, and death.  You should be screened for sexually transmitted illnesses (STIs) including gonorrhea and chlamydia if:  You are sexually active and are younger than 24 years.  You are older than 24 years and your health care provider tells you that you are at risk for this type of infection.  Your  sexual activity has changed since you were last screened and you are at an increased risk for chlamydia or gonorrhea. Ask your health care provider if you are at risk.  If you are at risk of being infected with HIV, it is recommended that you take a prescription medicine daily to prevent HIV infection. This is called preexposure prophylaxis (PrEP). You are considered at risk if:  You are sexually active and do not regularly use condoms or know the HIV status of your partner(s).  You take drugs by injection.  You are sexually active with a partner who has HIV.  Talk with your health care provider about whether you are at high risk of being  infected with HIV. If you choose to begin PrEP, you should first be tested for HIV. You should then be tested every 3 months for as long as you are taking PrEP.  Osteoporosis is a disease in which the bones lose minerals and strength with aging. This can result in serious bone fractures or breaks. The risk of osteoporosis can be identified using a bone density scan. Women ages 49 years and over and women at risk for fractures or osteoporosis should discuss screening with their health care providers. Ask your health care provider whether you should take a calcium supplement or vitamin D to reduce the rate of osteoporosis.  Menopause can be associated with physical symptoms and risks. Hormone replacement therapy is available to decrease symptoms and risks. You should talk to your health care provider about whether hormone replacement therapy is right for you.  Use sunscreen. Apply sunscreen liberally and repeatedly throughout the day. You should seek shade when your shadow is shorter than you. Protect yourself by wearing long sleeves, pants, a wide-brimmed hat, and sunglasses year round, whenever you are outdoors.  Once a month, do a whole body skin exam, using a mirror to look at the skin on your back. Tell your health care provider of new moles, moles that have  irregular borders, moles that are larger than a pencil eraser, or moles that have changed in shape or color.  Stay current with required vaccines (immunizations).  Influenza vaccine. All adults should be immunized every year.  Tetanus, diphtheria, and acellular pertussis (Td, Tdap) vaccine. Pregnant women should receive 1 dose of Tdap vaccine during each pregnancy. The dose should be obtained regardless of the length of time since the last dose. Immunization is preferred during the 27th-36th week of gestation. An adult who has not previously received Tdap or who does not know her vaccine status should receive 1 dose of Tdap. This initial dose should be followed by tetanus and diphtheria toxoids (Td) booster doses every 10 years. Adults with an unknown or incomplete history of completing a 3-dose immunization series with Td-containing vaccines should begin or complete a primary immunization series including a Tdap dose. Adults should receive a Td booster every 10 years.  Varicella vaccine. An adult without evidence of immunity to varicella should receive 2 doses or a second dose if she has previously received 1 dose. Pregnant females who do not have evidence of immunity should receive the first dose after pregnancy. This first dose should be obtained before leaving the health care facility. The second dose should be obtained 4-8 weeks after the first dose.  Human papillomavirus (HPV) vaccine. Females aged 13-26 years who have not received the vaccine previously should obtain the 3-dose series. The vaccine is not recommended for use in pregnant females. However, pregnancy testing is not needed before receiving a dose. If a female is found to be pregnant after receiving a dose, no treatment is needed. In that case, the remaining doses should be delayed until after the pregnancy. Immunization is recommended for any person with an immunocompromised condition through the age of 28 years if she did not get any or  all doses earlier. During the 3-dose series, the second dose should be obtained 4-8 weeks after the first dose. The third dose should be obtained 24 weeks after the first dose and 16 weeks after the second dose.  Zoster vaccine. One dose is recommended for adults aged 68 years or older unless certain conditions are present.  Measles, mumps, and rubella (  MMR) vaccine. Adults born before [birth year] generally are considered immune to measles and mumps. Adults born in [birth year] or later should have 1 or more doses of MMR vaccine unless there is a contraindication to the vaccine or there is laboratory evidence of immunity to each of the three diseases. A routine second dose of MMR vaccine should be obtained at least 28 days after the first dose for students attending postsecondary schools, health care workers, or international travelers. People who received inactivated measles vaccine or an unknown type of measles vaccine during 1963-1967 should receive 2 doses of MMR vaccine. People who received inactivated mumps vaccine or an unknown type of mumps vaccine before 1979 and are at high risk for mumps infection should consider immunization with 2 doses of MMR vaccine. For females of childbearing age, rubella immunity should be determined. If there is no evidence of immunity, females who are not pregnant should be vaccinated. If there is no evidence of immunity, females who are pregnant should delay immunization until after pregnancy. Unvaccinated health care workers born before [birth year] who lack laboratory evidence of measles, mumps, or rubella immunity or laboratory confirmation of disease should consider measles and mumps immunization with 2 doses of MMR vaccine or rubella immunization with 1 dose of MMR vaccine.  Pneumococcal 13-valent conjugate (PCV13) vaccine. When indicated, a person who is uncertain of his immunization history and has no record of immunization should receive the PCV13 vaccine. All adults 81 years of age and  older should receive this vaccine. An adult aged 66 years or older who has certain medical conditions and has not been previously immunized should receive 1 dose of PCV13 vaccine. This PCV13 should be followed with a dose of pneumococcal polysaccharide (PPSV23) vaccine. Adults who are at high risk for pneumococcal disease should obtain the PPSV23 vaccine at least 8 weeks after the dose of PCV13 vaccine. Adults older than 56 years of age who have normal immune system function should obtain the PPSV23 vaccine dose at least 1 year after the dose of PCV13 vaccine.  Pneumococcal polysaccharide (PPSV23) vaccine. When PCV13 is also indicated, PCV13 should be obtained first. All adults aged 46 years and older should be immunized. An adult younger than age 42 years who has certain medical conditions should be immunized. Any person who resides in a nursing home or long-term care facility should be immunized. An adult smoker should be immunized. People with an immunocompromised condition and certain other conditions should receive both PCV13 and PPSV23 vaccines. People with human immunodeficiency virus (HIV) infection should be immunized as soon as possible after diagnosis. Immunization during chemotherapy or radiation therapy should be avoided. Routine use of PPSV23 vaccine is not recommended for American Indians, [place] Natives, or people younger than 65 years unless there are medical conditions that require PPSV23 vaccine. When indicated, people who have unknown immunization and have no record of immunization should receive PPSV23 vaccine. One-time revaccination 5 years after the first dose of PPSV23 is recommended for people aged 19-64 years who have chronic kidney failure, nephrotic syndrome, asplenia, or immunocompromised conditions. People who received 1-2 doses of PPSV23 before age 30 years should receive another dose of PPSV23 vaccine at age 26 years or later if at least 5 years have passed since the previous dose.  Doses of PPSV23 are not needed for people immunized with PPSV23 at or after age 74 years.  Meningococcal vaccine. Adults with asplenia or persistent complement component deficiencies should receive 2 doses of quadrivalent meningococcal conjugate (MenACWY-D) vaccine. The  doses should be obtained at least 2 months apart. Microbiologists working with certain meningococcal bacteria, [place] recruits, people at risk during an outbreak, and people who travel to or live in countries with a high rate of meningitis should be immunized. A first-year college student up through age 42 years who is living in a residence hall should receive a dose if she did not receive a dose on or after her 16th birthday. Adults who have certain high-risk conditions should receive one or more doses of vaccine.  Hepatitis A vaccine. Adults who wish to be protected from this disease, have certain high-risk conditions, work with hepatitis A-infected animals, work in hepatitis A research labs, or travel to or work in countries with a high rate of hepatitis A should be immunized. Adults who were previously unvaccinated and who anticipate close contact with an international adoptee during the first 60 days after arrival in the [place] from a country with a high rate of hepatitis A should be immunized.  Hepatitis B vaccine. Adults who wish to be protected from this disease, have certain high-risk conditions, may be exposed to blood or other infectious body fluids, are household contacts or sex partners of hepatitis B positive people, are clients or workers in certain care facilities, or travel to or work in countries with a high rate of hepatitis B should be immunized.  Haemophilus influenzae type b (Hib) vaccine. A previously unvaccinated person with asplenia or sickle cell disease or having a scheduled splenectomy should receive 1 dose of Hib vaccine. Regardless of previous immunization, a recipient of a hematopoietic stem cell  transplant should receive a 3-dose series 6-12 months after her successful transplant. Hib vaccine is not recommended for adults with HIV infection. Preventive Services / Frequency Ages 41 to 10 years  Blood pressure check.** / Every 3-5 years.  Lipid and cholesterol check.** / Every 5 years beginning at age 70.  Clinical breast exam.** / Every 3 years for women in their 58s and 30s.  BRCA-related cancer risk assessment.** / For women who have family members with a BRCA-related cancer (breast, ovarian, tubal, or peritoneal cancers).  Pap test.** / Every 2 years from ages 72 through 35. Every 3 years starting at age 80 through age 31 or 42 with a history of 3 consecutive normal Pap tests.  HPV screening.** / Every 3 years from ages 76 through ages 58 to 66 with a history of 3 consecutive normal Pap tests.  Hepatitis C blood test.** / For any individual with known risks for hepatitis C.  Skin self-exam. / Monthly.  Influenza vaccine. / Every year.  Tetanus, diphtheria, and acellular pertussis (Tdap, Td) vaccine.** / Consult your health care provider. Pregnant women should receive 1 dose of Tdap vaccine during each pregnancy. 1 dose of Td every 10 years.  Varicella vaccine.** / Consult your health care provider. Pregnant females who do not have evidence of immunity should receive the first dose after pregnancy.  HPV vaccine. / 3 doses over 6 months, if 64 and younger. The vaccine is not recommended for use in pregnant females. However, pregnancy testing is not needed before receiving a dose.  Measles, mumps, rubella (MMR) vaccine.** / You need at least 1 dose of MMR if you were born in 1957 or later. You may also need a 2nd dose. For females of childbearing age, rubella immunity should be determined. If there is no evidence of immunity, females who are not pregnant should be vaccinated. If there is no evidence of  immunity, females who are pregnant should delay immunization until after  pregnancy.  Pneumococcal 13-valent conjugate (PCV13) vaccine.** / Consult your health care provider.  Pneumococcal polysaccharide (PPSV23) vaccine.** / 1 to 2 doses if you smoke cigarettes or if you have certain conditions.  Meningococcal vaccine.** / 1 dose if you are age 72 to 9 years and a Market researcher living in a residence hall, or have one of several medical conditions, you need to get vaccinated against meningococcal disease. You may also need additional booster doses.  Hepatitis A vaccine.** / Consult your health care provider.  Hepatitis B vaccine.** / Consult your health care provider.  Haemophilus influenzae type b (Hib) vaccine.** / Consult your health care provider. Ages 76 to 47 years  Blood pressure check.** / Every year.  Lipid and cholesterol check.** / Every 5 years beginning at age 2 years.  Lung cancer screening. / Every year if you are aged 53-80 years and have a 30-pack-year history of smoking and currently smoke or have quit within the past 15 years. Yearly screening is stopped once you have quit smoking for at least 15 years or develop a health problem that would prevent you from having lung cancer treatment.  Clinical breast exam.** / Every year after age 32 years.  BRCA-related cancer risk assessment.** / For women who have family members with a BRCA-related cancer (breast, ovarian, tubal, or peritoneal cancers).  Mammogram.** / Every year beginning at age 44 years and continuing for as long as you are in good health. Consult with your health care provider.  Pap test.** / Every 3 years starting at age 40 years through age 85 or 66 years with a history of 3 consecutive normal Pap tests.  HPV screening.** / Every 3 years from ages 36 years through ages 67 to 60 years with a history of 3 consecutive normal Pap tests.  Fecal occult blood test (FOBT) of stool. / Every year beginning at age 73 years and continuing until age 46 years. You may not need  to do this test if you get a colonoscopy every 10 years.  Flexible sigmoidoscopy or colonoscopy.** / Every 5 years for a flexible sigmoidoscopy or every 10 years for a colonoscopy beginning at age 14 years and continuing until age 64 years.  Hepatitis C blood test.** / For all people born from [birth year] through 1966 and any individual with known risks for hepatitis C.  Skin self-exam. / Monthly.  Influenza vaccine. / Every year.  Tetanus, diphtheria, and acellular pertussis (Tdap/Td) vaccine.** / Consult your health care provider. Pregnant women should receive 1 dose of Tdap vaccine during each pregnancy. 1 dose of Td every 10 years.  Varicella vaccine.** / Consult your health care provider. Pregnant females who do not have evidence of immunity should receive the first dose after pregnancy.  Zoster vaccine.** / 1 dose for adults aged 23 years or older.  Measles, mumps, rubella (MMR) vaccine.** / You need at least 1 dose of MMR if you were born in 1957 or later. You may also need a second dose. For females of childbearing age, rubella immunity should be determined. If there is no evidence of immunity, females who are not pregnant should be vaccinated. If there is no evidence of immunity, females who are pregnant should delay immunization until after pregnancy.  Pneumococcal 13-valent conjugate (PCV13) vaccine.** / Consult your health care provider.  Pneumococcal polysaccharide (PPSV23) vaccine.** / 1 to 2 doses if you smoke cigarettes or if you have certain conditions.  Meningococcal vaccine.** / Consult your health care provider.  Hepatitis A vaccine.** / Consult your health care provider.  Hepatitis B vaccine.** / Consult your health care provider.  Haemophilus influenzae type b (Hib) vaccine.** / Consult your health care provider. Ages 54 years and over  Blood pressure check.** / Every year.  Lipid and cholesterol check.** / Every 5 years beginning at age 87 years.  Lung cancer  screening. / Every year if you are aged 71-80 years and have a 30-pack-year history of smoking and currently smoke or have quit within the past 15 years. Yearly screening is stopped once you have quit smoking for at least 15 years or develop a health problem that would prevent you from having lung cancer treatment.  Clinical breast exam.** / Every year after age 68 years.  BRCA-related cancer risk assessment.** / For women who have family members with a BRCA-related cancer (breast, ovarian, tubal, or peritoneal cancers).  Mammogram.** / Every year beginning at age 93 years and continuing for as long as you are in good health. Consult with your health care provider.  Pap test.** / Every 3 years starting at age 76 years through age 45 or 58 years with 3 consecutive normal Pap tests. Testing can be stopped between 65 and 70 years with 3 consecutive normal Pap tests and no abnormal Pap or HPV tests in the past 10 years.  HPV screening.** / Every 3 years from ages 37 years through ages 35 or 59 years with a history of 3 consecutive normal Pap tests. Testing can be stopped between 65 and 70 years with 3 consecutive normal Pap tests and no abnormal Pap or HPV tests in the past 10 years.  Fecal occult blood test (FOBT) of stool. / Every year beginning at age 5 years and continuing until age 66 years. You may not need to do this test if you get a colonoscopy every 10 years.  Flexible sigmoidoscopy or colonoscopy.** / Every 5 years for a flexible sigmoidoscopy or every 10 years for a colonoscopy beginning at age 49 years and continuing until age 81 years.  Hepatitis C blood test.** / For all people born from [birth year] through 1966 and any individual with known risks for hepatitis C.  Osteoporosis screening.** / A one-time screening for women ages 28 years and over and women at risk for fractures or osteoporosis.  Skin self-exam. / Monthly.  Influenza vaccine. / Every year.  Tetanus, diphtheria, and  acellular pertussis (Tdap/Td) vaccine.** / 1 dose of Td every 10 years.  Varicella vaccine.** / Consult your health care provider.  Zoster vaccine.** / 1 dose for adults aged 36 years or older.  Pneumococcal 13-valent conjugate (PCV13) vaccine.** / Consult your health care provider.  Pneumococcal polysaccharide (PPSV23) vaccine.** / 1 dose for all adults aged 39 years and older.  Meningococcal vaccine.** / Consult your health care provider.  Hepatitis A vaccine.** / Consult your health care provider.  Hepatitis B vaccine.** / Consult your health care provider.  Haemophilus influenzae type b (Hib) vaccine.** / Consult your health care provider. ** Family history and personal history of risk and conditions may change your health care provider's recommendations.   This information is not intended to replace advice given to you by your health care provider. Make sure you discuss any questions you have with your health care provider.   Document Released: 07/04/2001 Document Revised: 05/29/2014 Document Reviewed: 10/03/2010 Elsevier Interactive Patient Education [employer].

## 2015-11-30 NOTE — Assessment & Plan Note (Signed)
Well controlled, no changes to meds. Encouraged heart healthy diet such as the DASH diet and exercise as tolerated.  °

## 2015-12-03 ENCOUNTER — Other Ambulatory Visit: Payer: Self-pay | Admitting: Family Medicine

## 2015-12-10 NOTE — Assessment & Plan Note (Signed)
Stable, mild asymptomatic, report worsening symptoms

## 2015-12-23 ENCOUNTER — Telehealth: Payer: Self-pay | Admitting: Family Medicine

## 2015-12-23 DIAGNOSIS — R52 Pain, unspecified: Secondary | ICD-10-CM

## 2015-12-23 DIAGNOSIS — M069 Rheumatoid arthritis, unspecified: Secondary | ICD-10-CM

## 2015-12-23 NOTE — Telephone Encounter (Signed)
Patient called in to advise that she is in need of renewal for fentaNYL (DURAGESIC - DOSED MCG/HR) 100 MCG/HR [id number]  And  oxyCODONE-acetaminophen (PERCOCET) 10-325 MG tablet [id number]

## 2015-12-23 NOTE — Telephone Encounter (Signed)
OK to refill both meds

## 2015-12-23 NOTE — Telephone Encounter (Signed)
Pt is requesting refill on Fentanyl and Oxycodone.   Last OV: 11/30/2015 Last Fill on Fentanyl: 11/25/2015 #15 and 0RF Last Fill on Oxycodone: 11/25/2015 #180 and 0RF UDS: 02/17/2015 Low risk  Please advise.

## 2015-12-24 MED ORDER — FENTANYL 100 MCG/HR TD PT72: 100.0000 ug | MEDICATED_PATCH | TRANSDERMAL | 0 refills | Status: DC

## 2015-12-24 MED ORDER — OXYCODONE-ACETAMINOPHEN 10-325 MG PO TABS: 2.0000 | ORAL_TABLET | ORAL | 0 refills | Status: DC | PRN

## 2015-12-24 MED FILL — OXYCODONE-APAP 10-325 TAB: 10-325 | 15 days supply | Qty: 180 | Fill #0

## 2015-12-24 NOTE — Telephone Encounter (Signed)
Pt called in to follow up on Rx refill request. Pt would like to pick up today if possible .

## 2015-12-24 NOTE — Telephone Encounter (Signed)
Rx printed and will have covering MD sign; Rx ready for p/u, pt informed via message on VM/SLS 08/04

## 2016-01-14 ENCOUNTER — Other Ambulatory Visit: Payer: Self-pay | Admitting: Neurosurgery

## 2016-01-14 DIAGNOSIS — M5020 Other cervical disc displacement, unspecified cervical region: Secondary | ICD-10-CM

## 2016-01-15 ENCOUNTER — Ambulatory Visit
Admission: RE | Admit: 2016-01-15 | Discharge: 2016-01-15 | Disposition: A | Discharge status: Home / Self Care | Payer: 59 | Source: Ambulatory Visit | Attending: Neurosurgery | Admitting: Neurosurgery

## 2016-01-15 DIAGNOSIS — M5020 Other cervical disc displacement, unspecified cervical region: Secondary | ICD-10-CM

## 2016-01-20 ENCOUNTER — Telehealth: Payer: Self-pay | Admitting: Family Medicine

## 2016-01-20 DIAGNOSIS — M069 Rheumatoid arthritis, unspecified: Secondary | ICD-10-CM

## 2016-01-20 DIAGNOSIS — R52 Pain, unspecified: Secondary | ICD-10-CM

## 2016-01-20 NOTE — Telephone Encounter (Signed)
Caller name: Kyira Relationship to patient: self Can be reached: [phone]  Reason for call: pt called for refill on fentanyl and oxycodone. She would like to pick up Friday 01/21/16. She is going to the beach Saturday morning and Monday is a holiday.

## 2016-01-20 NOTE — Telephone Encounter (Signed)
OK to refill both meds requested

## 2016-01-20 NOTE — Telephone Encounter (Signed)
Requesting:  Oxycodone and fentanyl patch Contract   925/2015 UDS  02/17/15 Last OV   11/30/2015 Last Refill  Oxy  #180 with 0 refills on 12/24/2015                    fantanyl  #15 with 0 refills on 12/24/2015  Please Advise

## 2016-01-21 MED ORDER — OXYCODONE-ACETAMINOPHEN 10-325 MG PO TABS: 2.0000 | ORAL_TABLET | ORAL | 0 refills | Status: DC | PRN

## 2016-01-21 MED ORDER — FENTANYL 100 MCG/HR TD PT72: 100.0000 ug | MEDICATED_PATCH | TRANSDERMAL | 0 refills | Status: DC

## 2016-01-21 MED FILL — OXYCODONE-APAP 10-325: 10-325 | 30 days supply | Qty: 180 | Fill #0

## 2016-01-21 NOTE — Telephone Encounter (Signed)
Printed prescriptions/PCP signed. Called the patient informed ok to pickup at the front desk.(left msg.)

## 2016-02-15 ENCOUNTER — Telehealth: Payer: Self-pay | Admitting: Family Medicine

## 2016-02-15 DIAGNOSIS — M069 Rheumatoid arthritis, unspecified: Secondary | ICD-10-CM

## 2016-02-15 DIAGNOSIS — R52 Pain, unspecified: Secondary | ICD-10-CM

## 2016-02-15 NOTE — Telephone Encounter (Signed)
I saw this in evening. Am willing to write rx for both meds but will sign on 9/28

## 2016-02-15 NOTE — Telephone Encounter (Signed)
°  Relation to [id number] Call back [phone] Pharmacy:  Reason for call:  Pt is having anterior disc fusion surgery on Thursday (02-17-16) with Dr. Jovita Gamma, pt will be out for 4 to 6 weeks unable to drive, pt would like to know if at all possible if Dr. Charlett Blake can write her rx for oxyCODONE-acetaminophen (PERCOCET) 10-325 MG tablet and fentaNYL (DURAGESIC - DOSED MCG/HR) 100 MCG/HR, for her to pick up tomorrow before the surgery, pt is not asking for the rx to be written early just wants to have the rx there and available because she will be out of meds before she will be able to drive again after surgery. Please contact pt to make her aware if rx can be available and ready for pick up tomorrow

## 2016-02-16 MED ORDER — OXYCODONE-ACETAMINOPHEN 10-325 MG PO TABS: 2.0000 | ORAL_TABLET | ORAL | 0 refills | Status: DC | PRN

## 2016-02-16 MED ORDER — FENTANYL 100 MCG/HR TD PT72: 100.0000 ug | MEDICATED_PATCH | TRANSDERMAL | 0 refills | Status: DC

## 2016-02-16 NOTE — Telephone Encounter (Signed)
Pt notified. She is having surgery tomorrow morning and will have someone come to pick up rxs when they are ready. Do you want start date for rxs written for tomorrow or 10/1? Both were last filled 9/1, 30 day supply.

## 2016-02-16 NOTE — Telephone Encounter (Signed)
Rxs printed and placed on Robin's desk for PCP signature tomorrow.

## 2016-02-16 NOTE — Telephone Encounter (Signed)
Can write them to be signed tomorrow but in notes on bottom of rx write they can be filled on 02/20/2016. Thanks

## 2016-02-17 HISTORY — PX: NECK SURGERY: SHX720

## 2016-02-17 NOTE — Telephone Encounter (Signed)
Called the patient informed hardcopy's are ready for pickup.

## 2016-02-17 NOTE — Telephone Encounter (Signed)
Pt spoke w/ [facility] 02/16/16 @ 2051 to inform us that either [place] or [facility] "Manus Gunning" Joslyn Devon will pick up rx.

## 2016-02-25 IMAGING — US US TRANSVAGINAL NON-OB
1 series · 13 of 25 positions shown · non-contrast
Comparison: CT abdomen and pelvis performed earlier today.

CLINICAL DATA: Acute onset of right lower quadrant abdominal pain
and right pelvic pain earlier today.

EXAM:
TRANSABDOMINAL AND TRANSVAGINAL ULTRASOUND OF PELVIS
TECHNIQUE: Both transabdominal and transvaginal ultrasound examinations of the
pelvis were performed. Transabdominal technique was performed for
global imaging of the pelvis including uterus, ovaries, adnexal
regions, and pelvic cul-de-sac. It was necessary to proceed with
endovaginal exam following the transabdominal exam to visualize the
endometrium and ovaries, as the bladder was incompletely distended.

[Series 1: us transvaginal non-ob · 0.29mm/px · 13 of 37 slices shown]
[im 1/37]
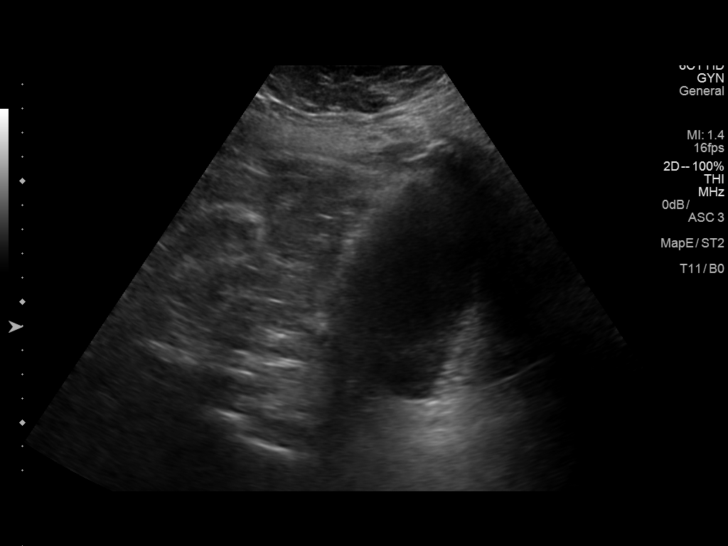
[im 4/37]
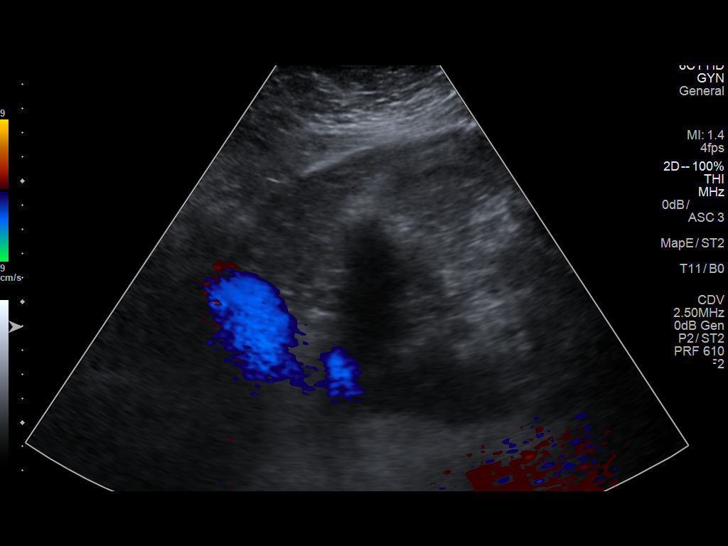
[im 7/37]
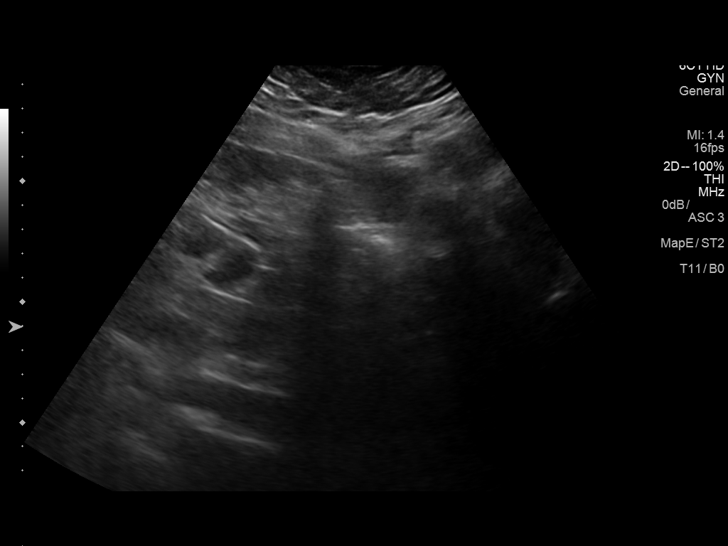
[im 10/37]
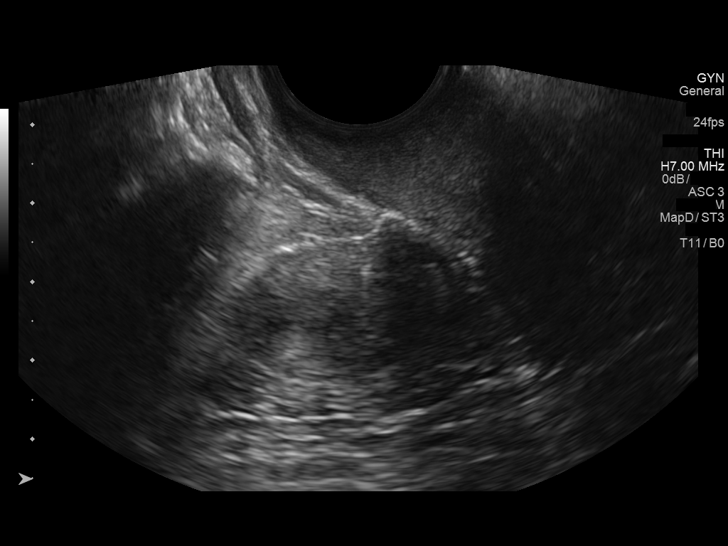
[im 13/37]
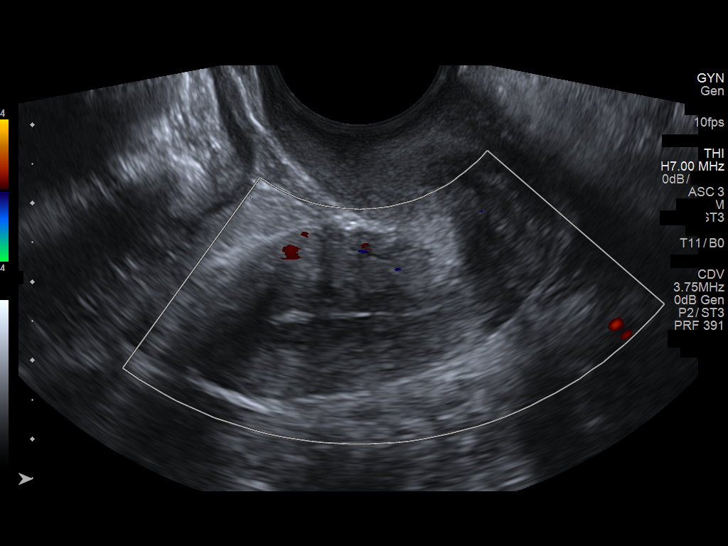
[im 16/37]
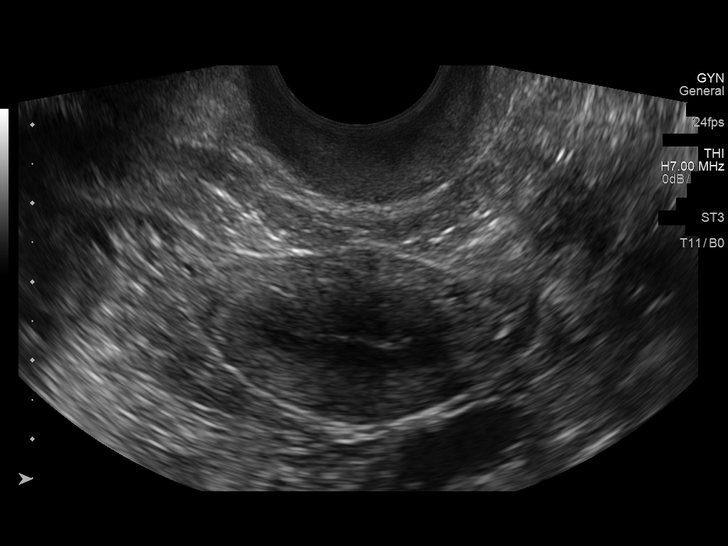
[im 19/37]
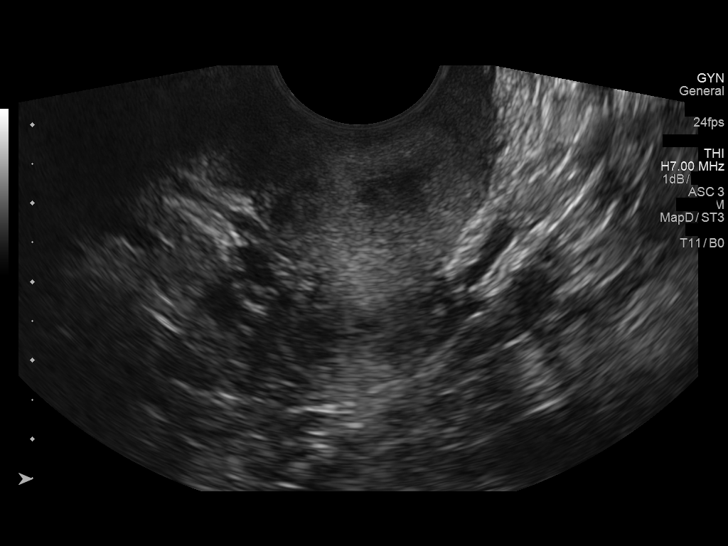
[im 22/37]
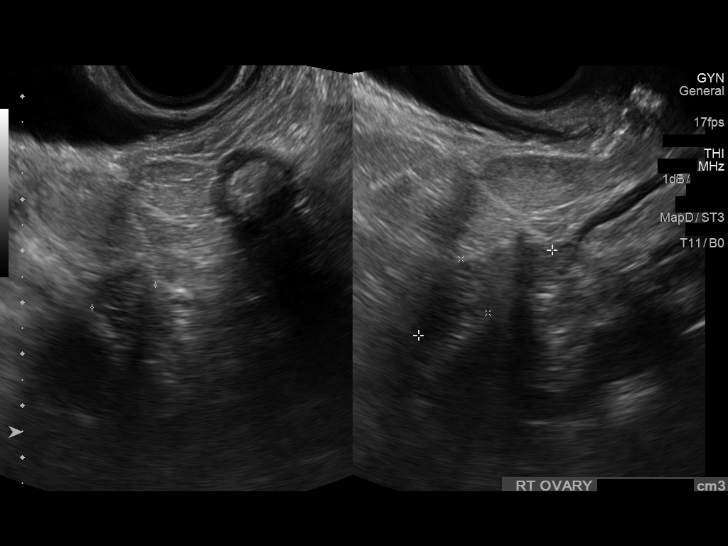
[im 25/37]
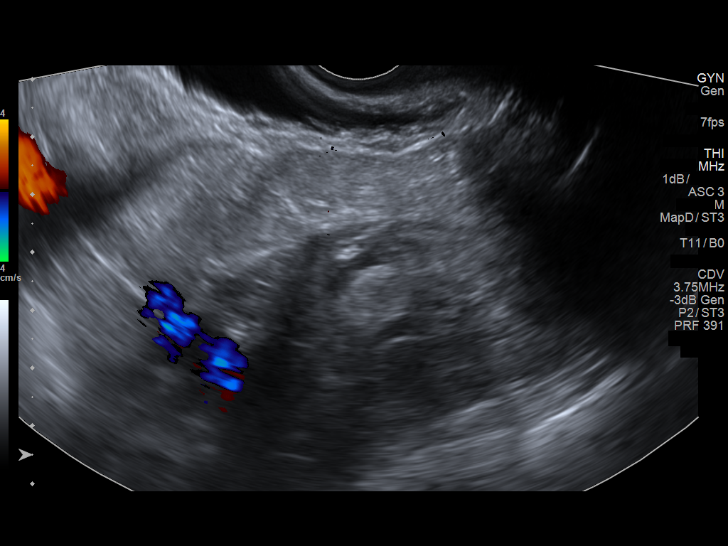
[im 28/37]
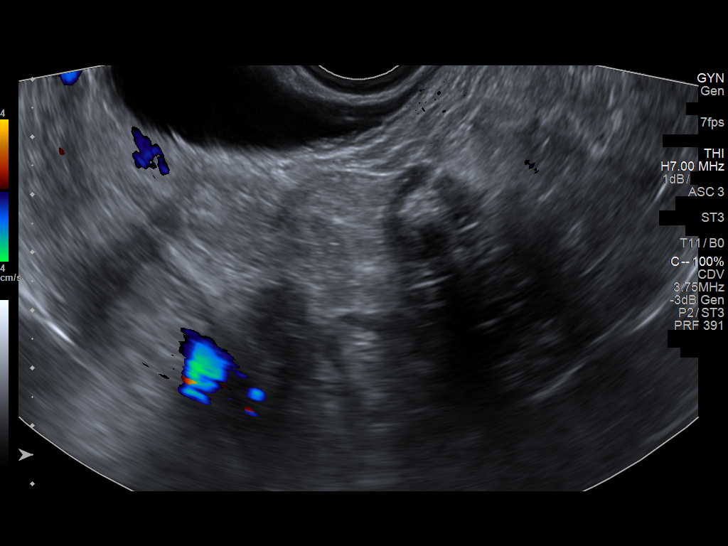
[im 31/37]
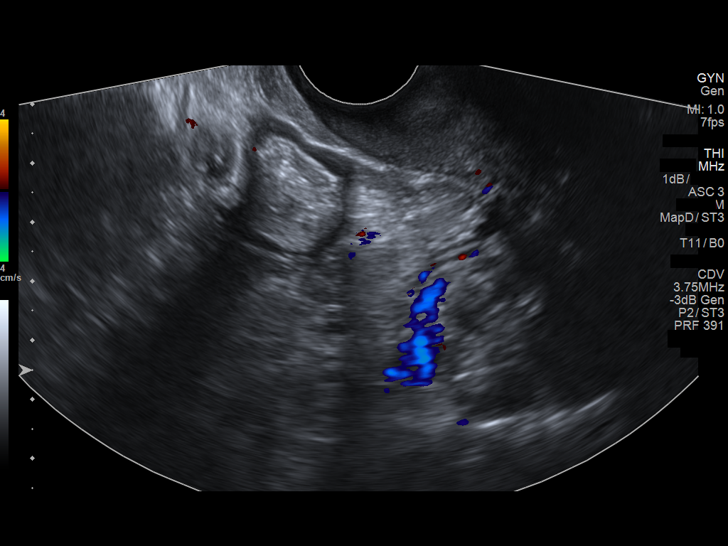
[im 34/37]
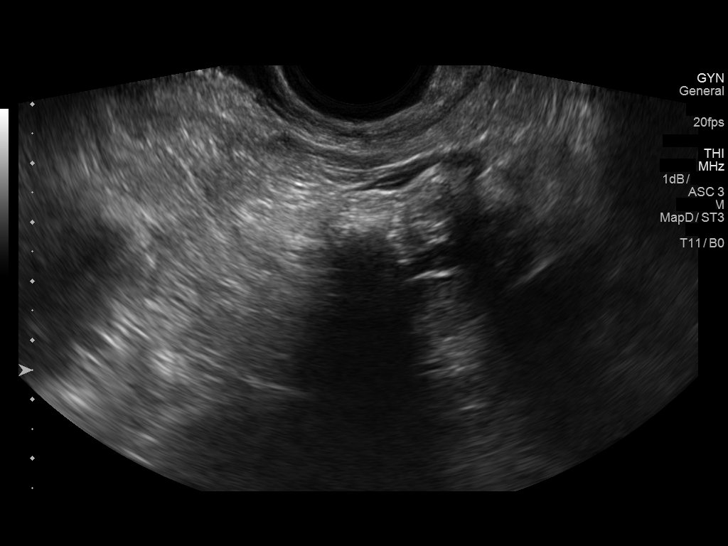
[im 37/37]
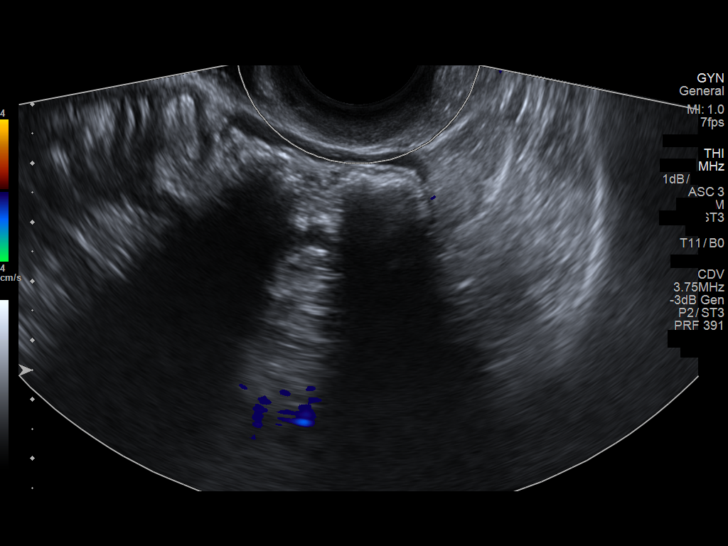

[13 of 25 positions shown; findings below may reference images not displayed]

FINDINGS: Uterus

Measurements: Approximately 5.3 x 2.5 x 3.7 cm. Homogeneous
echotexture without focal fibroid or other myometrial abnormality.
Normal-appearing uterine cervix.

Endometrium

Thickness: Approximately 2 mm. Normal appearance without evidence of
endometrial fluid or mass.

Right ovary

Measurements: Approximately 3.1 x 1.2 x 1.3 cm. Only visualized
transvaginally, and difficult to visualize due to its superior
position in the pelvis as noted on the earlier CT. No dominant cyst
or solid mass. Normal appearance on the earlier CT.

Left ovary

Not visualized either transabdominally or transvaginally. Normal
appearance on the earlier CT, also superiorly positioned in the
pelvis.

Other findings

No free fluid.
IMPRESSION: 1. Ovaries difficult to visualize by ultrasound due to their
superior location in the pelvis as noted on the earlier CT. Right
ovary unremarkable by ultrasound. Left ovary not visualized. Both
ovaries have a normal appearance on the earlier CT.
2. Normal-appearing uterus and endometrium.

## 2016-03-17 ENCOUNTER — Other Ambulatory Visit: Payer: Self-pay | Admitting: Family Medicine

## 2016-03-17 DIAGNOSIS — R52 Pain, unspecified: Secondary | ICD-10-CM

## 2016-03-17 DIAGNOSIS — M069 Rheumatoid arthritis, unspecified: Secondary | ICD-10-CM

## 2016-03-17 NOTE — Telephone Encounter (Signed)
These are not due until 03/22/2016 can fill next week

## 2016-03-17 NOTE — Telephone Encounter (Signed)
Patient is requesting a refill of these medications  fentaNYL (DURAGESIC - DOSED MCG/HR) 100 MCG/HR  And oxyCODONE-acetaminophen (PERCOCET) 10-325 MG tablet. Patient states that refills are due on Monday. Please advise.    Patient also would like Dr. Randel Pigg to know that she just had interior fusion surgery on the 02/17/16.    Patient phone: [phone]

## 2016-03-17 NOTE — Telephone Encounter (Signed)
Requesting: Oxycodone and fentanyl Contract   02/13/2014 UDS  Low risk Last OV   11/30/2015 Last Refill  Oxycodone  #180 with 0 refills 02/17/2016                   Fentanyl  #15 with 0 refills on 02/17/2016  Please Advise

## 2016-03-17 NOTE — Telephone Encounter (Signed)
The prescriptions read that she was not supposed to be filling them until 02/20/2016. So now she should not be able to get them til 03/21/2016 which is 30 days. They were not supposed to let her fill them early. She can have them Monday

## 2016-03-20 MED ORDER — FENTANYL 100 MCG/HR TD PT72: 100.0000 ug | MEDICATED_PATCH | TRANSDERMAL | 0 refills | Status: DC

## 2016-03-20 MED ORDER — OXYCODONE-ACETAMINOPHEN 10-325 MG PO TABS: 2.0000 | ORAL_TABLET | ORAL | 0 refills | Status: DC | PRN

## 2016-03-20 MED FILL — OXYCODONE-APAP 10-325: 10-325 | 15 days supply | Qty: 180 | Fill #0

## 2016-03-20 NOTE — Telephone Encounter (Signed)
Printed prescriptions will let patient know to pickup at her convenience

## 2016-03-20 NOTE — Addendum Note (Signed)
Addended by: Sharon Seller B on: 03/20/2016 11:14 AM   Modules accepted: Orders

## 2016-03-25 ENCOUNTER — Other Ambulatory Visit: Payer: Self-pay | Admitting: Family Medicine

## 2016-03-29 ENCOUNTER — Other Ambulatory Visit: Payer: Self-pay | Admitting: Family Medicine

## 2016-03-29 NOTE — Telephone Encounter (Signed)
Rx request Denied, Duplicate request/SLS [id number]  Medication Detail  Disp Refills Start End   CARTIA XT 240 MG 24 hr capsule 30 capsule 5 03/25/2016    Sig: take 1 capsule by mouth once daily   E-Prescribing Status: Receipt confirmed by pharmacy (03/25/2016 4:11 PM EDT)   Pharmacy   [facility], [place]

## 2016-04-18 ENCOUNTER — Telehealth: Payer: Self-pay | Admitting: Family Medicine

## 2016-04-18 DIAGNOSIS — M069 Rheumatoid arthritis, unspecified: Secondary | ICD-10-CM

## 2016-04-18 DIAGNOSIS — R52 Pain, unspecified: Secondary | ICD-10-CM

## 2016-04-18 MED ORDER — FENTANYL 100 MCG/HR TD PT72: 100.0000 ug | MEDICATED_PATCH | TRANSDERMAL | 0 refills | Status: DC

## 2016-04-18 MED ORDER — OXYCODONE-ACETAMINOPHEN 10-325 MG PO TABS: 2.0000 | ORAL_TABLET | ORAL | 0 refills | Status: DC | PRN

## 2016-04-18 NOTE — Telephone Encounter (Signed)
Ok to refill both meds but needs UDS

## 2016-04-18 NOTE — Telephone Encounter (Signed)
Called the patient informed ok to pickup oxycodone and fentanyl (printed on one sheet) today. BUT, must update UDS--there is a sticky note attached to refill to remind front staff to have her do UDS.

## 2016-04-18 NOTE — Telephone Encounter (Signed)
Requesting:   Oxycodone and Fentanyl Contract    Signed on 02/13/2014 UDS  Low risk---NEEDS UDS UPDATED Last OV    11/30/2015 Last Refill   Fentanyl--#15 on 03/20/2016                     Oxycodone---#180 on 03/20/2016  Please Advise

## 2016-04-18 NOTE — Telephone Encounter (Signed)
Caller name: Merve Relationship to patient: self Can be reached: [phone]  Reason for call: pt needing RX for fentanyl and oxycodone. Please call when ready for pick up. 2 days on hand.

## 2016-04-21 ENCOUNTER — Encounter: Payer: Self-pay | Admitting: Family Medicine

## 2016-04-21 MED FILL — OXYCODONE-APAP 10-325: 10-325 | 15 days supply | Qty: 180 | Fill #0

## 2016-05-17 ENCOUNTER — Telehealth: Payer: Self-pay | Admitting: Family Medicine

## 2016-05-17 DIAGNOSIS — M069 Rheumatoid arthritis, unspecified: Secondary | ICD-10-CM

## 2016-05-17 DIAGNOSIS — R52 Pain, unspecified: Secondary | ICD-10-CM

## 2016-05-17 NOTE — Telephone Encounter (Signed)
Relation to [id number] Call back number:[phone]  Reason for call:  Patient requesting a refill fentaNYL (DURAGESIC - DOSED MCG/HR) 100 MCG/HR and oxyCODONE-acetaminophen (PERCOCET) 10-325 MG table

## 2016-05-18 MED ORDER — FENTANYL 100 MCG/HR TD PT72: 100.0000 ug | MEDICATED_PATCH | TRANSDERMAL | 0 refills | Status: DC

## 2016-05-18 MED ORDER — OXYCODONE-ACETAMINOPHEN 10-325 MG PO TABS: 2.0000 | ORAL_TABLET | ORAL | 0 refills | Status: DC | PRN

## 2016-05-18 MED FILL — OXYCODONE-APAP 10-325: 10-325 | 15 days supply | Qty: 180 | Fill #0

## 2016-05-18 NOTE — Telephone Encounter (Signed)
Printed and called the patient left a detailed message hardcopy for both are ready for pickup.

## 2016-05-18 NOTE — Telephone Encounter (Signed)
Requesting:  Oxycodone and Fentanyl Contract  02/03/2014 UDS  Low risk Last OV   11/30/2015 Last Refill  Oxy---#180   04/18/2016                   Fentanyl---#15     04/18/2016  Please Advise

## 2016-05-18 NOTE — Telephone Encounter (Signed)
OK to refill both as long as s he has been seen in past 6 months

## 2016-06-01 ENCOUNTER — Encounter: Payer: Self-pay | Admitting: Family Medicine

## 2016-06-01 ENCOUNTER — Ambulatory Visit (INDEPENDENT_AMBULATORY_CARE_PROVIDER_SITE_OTHER): Payer: 59 | Admitting: Family Medicine

## 2016-06-01 VITALS — BP 139/85 | HR 68 | Temp 98.0°F | Ht 72.0 in | Wt 235.6 lb

## 2016-06-01 DIAGNOSIS — R739 Hyperglycemia, unspecified: Secondary | ICD-10-CM | POA: Diagnosis not present

## 2016-06-01 DIAGNOSIS — M069 Rheumatoid arthritis, unspecified: Secondary | ICD-10-CM | POA: Diagnosis not present

## 2016-06-01 DIAGNOSIS — I10 Essential (primary) hypertension: Secondary | ICD-10-CM | POA: Diagnosis not present

## 2016-06-01 DIAGNOSIS — R52 Pain, unspecified: Secondary | ICD-10-CM

## 2016-06-01 DIAGNOSIS — F17200 Nicotine dependence, unspecified, uncomplicated: Secondary | ICD-10-CM | POA: Diagnosis not present

## 2016-06-01 MED ORDER — FENTANYL 100 MCG/HR TD PT72: 100.0000 ug | MEDICATED_PATCH | TRANSDERMAL | 0 refills | Status: DC

## 2016-06-01 MED ORDER — OXYCODONE-ACETAMINOPHEN 10-325 MG PO TABS: 2.0000 | ORAL_TABLET | ORAL | 0 refills | Status: DC | PRN

## 2016-06-01 NOTE — Assessment & Plan Note (Signed)
Well controlled, no changes to meds. Encouraged heart healthy diet such as the DASH diet and exercise as tolerated.  °

## 2016-06-01 NOTE — Progress Notes (Signed)
Pre visit review using our clinic review tool, if applicable. No additional management support is needed unless otherwise documented below in the visit note. 

## 2016-06-01 NOTE — Progress Notes (Signed)
Subjective:    Patient ID: Jennifer Kelly, female    DOB: September 18, 1960, 56 y.o.   MRN: [id number]  No chief complaint on file.   HPI Patient is in today for 6 month follow up. No acute concerns noted. She continues to struggle with fatigue and diffuse chronic pain. No injury or fall. No recent febrile ill or falls. No injury. Denies CP/palp/SOB/HA/congestion/feversor GU c/o. Taking meds as prescribed. as chronic headaches as much as once to twice a wi  Past Medical History:  Diagnosis Date  . Acute bronchitis 01/05/2014  . Arthritis   . Chicken pox as a child  . Dermatitis 09/09/2014  . Ganglion cyst of left foot 09/09/2014  . H/O mumps   . History of chicken pox   . Hyperglycemia 11/30/2015  . Hypertension   . Mumps as a child  . Shingles 2014    Past Surgical History:  Procedure Laterality Date  . BACK SURGERY  2003  . NECK SURGERY  02/17/2016   C4, C5 & C6  . rotater cuff  20 yrs ago   left shoulder  . TOOTH EXTRACTION  05/2012  . WISDOM TOOTH EXTRACTION  56 yrs old    Family History  Problem Relation Age of Onset  . Parkinson's disease Mother   . Heart disease Mother   . Hypertension Mother   . Diabetes Father     type 2  . Hypertension Father   . Stroke Father   . Vascular Disease Brother   . Hypertension Brother   . Diabetes Brother   . Heart attack Paternal Uncle     Social History   Social History  . Marital status: Married    Spouse name: N/A  . Number of children: N/A  . Years of education: N/A   Occupational History  . Not on file.   Social History Main Topics  . Smoking status: Former Smoker    Start date: 11/30/2015  . Smokeless tobacco: Never Used  . Alcohol use Yes     Comment: occ wine  . Drug use: No  . Sexual activity: No     Comment: no dietary restrictions. lives alone   Other Topics Concern  . Not on file   Social History Narrative  . No narrative on file    Outpatient Medications Prior to Visit  Medication Sig Dispense  Refill  . abatacept (ORENCIA) 250 MG injection Inject into the vein once a week.    Marland Kitchen albuterol (VENTOLIN HFA) 108 (90 Base) MCG/ACT inhaler inhale 2 puffs by mouth every 6 hours if needed for wheezing FOR WHEEZING. 18 Inhaler 8  . CARTIA XT 240 MG 24 hr capsule take 1 capsule by mouth once daily 30 capsule 5  . citalopram (CELEXA) 20 MG tablet take 1 tablet by mouth once daily 30 tablet 11  . gabapentin (NEURONTIN) 600 MG tablet take 1 tablet by mouth three times a day 90 tablet 4  . ibuprofen (ADVIL,MOTRIN) 800 MG tablet     . losartan-hydrochlorothiazide (HYZAAR) 50-12.5 MG tablet take 1 tablet by mouth once daily 30 tablet 5  . predniSONE (DELTASONE) 5 MG tablet Take 1 tablet by mouth daily.  0  . fentaNYL (DURAGESIC - DOSED MCG/HR) 100 MCG/HR Place 1 patch (100 mcg total) onto the skin every other day. 15 patch 0  . oxyCODONE-acetaminophen (PERCOCET) 10-325 MG tablet Take 2 tablets by mouth every 4 (four) hours as needed for pain. 180 tablet 0   No facility-administered medications  prior to visit.     No Known Allergies  Review of Systems  Constitutional: Positive for malaise/fatigue. Negative for fever.  HENT: Negative for congestion.   Eyes: Negative for blurred vision.  Respiratory: Negative for shortness of breath.   Cardiovascular: Negative for chest pain, palpitations and leg swelling.  Gastrointestinal: Negative for abdominal pain, blood in stool and nausea.  Genitourinary: Negative for dysuria and frequency.  Musculoskeletal: Positive for back pain and joint pain. Negative for falls.  Skin: Negative for rash.  Neurological: Negative for dizziness, loss of consciousness and headaches.  Endo/Heme/Allergies: Negative for environmental allergies.  Psychiatric/Behavioral: Negative for depression. The patient is not nervous/anxious.        Objective:    Physical Exam  Constitutional: She is oriented to person, place, and time. She appears well-developed and well-nourished.  No distress.  HENT:  Head: Normocephalic and atraumatic.  Eyes: Conjunctivae are normal.  Neck: Normal range of motion. No thyromegaly present.  Cardiovascular: Normal rate and regular rhythm.   Pulmonary/Chest: Effort normal and breath sounds normal. She has no wheezes.  Abdominal: Soft. Bowel sounds are normal. There is no tenderness.  Musculoskeletal: She exhibits no edema or deformity.  Neurological: She is alert and oriented to person, place, and time.  Skin: Skin is warm and dry. She is not diaphoretic.  Psychiatric: She has a normal mood and affect.    BP 139/85 (BP Location: Left Arm, Patient Position: Sitting, Cuff Size: Large)   Pulse 68   Temp 98 F (36.7 C) (Oral)   Ht 6' (1.829 m)   Wt 235 lb 9.6 oz (106.9 kg)   SpO2 99% Comment: RA  BMI 31.95 kg/m  Wt Readings from Last 3 Encounters:  06/01/16 235 lb 9.6 oz (106.9 kg)  11/30/15 234 lb (106.1 kg)  05/14/15 252 lb 3.2 oz (114.4 kg)     Lab Results  Component Value Date   WBC 13.4 (H) 11/26/2015   HGB 14.5 11/26/2015   HCT 42.9 11/26/2015   PLT 248.0 11/26/2015   GLUCOSE 117 (H) 11/26/2015   CHOL 146 11/26/2015   TRIG 74.0 11/26/2015   HDL 50.30 11/26/2015   LDLCALC 81 11/26/2015   ALT 18 11/26/2015   AST 14 11/26/2015   NA 138 11/26/2015   K 3.9 11/26/2015   CL 100 11/26/2015   CREATININE 0.61 11/26/2015   BUN 15 11/26/2015   CO2 32 11/26/2015   TSH 2.57 11/26/2015    Lab Results  Component Value Date   TSH 2.57 11/26/2015   Lab Results  Component Value Date   WBC 13.4 (H) 11/26/2015   HGB 14.5 11/26/2015   HCT 42.9 11/26/2015   MCV 86.8 11/26/2015   PLT 248.0 11/26/2015   Lab Results  Component Value Date   NA 138 11/26/2015   K 3.9 11/26/2015   CO2 32 11/26/2015   GLUCOSE 117 (H) 11/26/2015   BUN 15 11/26/2015   CREATININE 0.61 11/26/2015   BILITOT 0.6 11/26/2015   ALKPHOS 90 11/26/2015   AST 14 11/26/2015   ALT 18 11/26/2015   PROT 7.3 11/26/2015   ALBUMIN 3.7 11/26/2015    CALCIUM 9.4 11/26/2015   ANIONGAP 8 11/26/2014   GFR 107.76 11/26/2015   Lab Results  Component Value Date   CHOL 146 11/26/2015   Lab Results  Component Value Date   HDL 50.30 11/26/2015   Lab Results  Component Value Date   LDLCALC 81 11/26/2015   Lab Results  Component Value Date  TRIG 74.0 11/26/2015   Lab Results  Component Value Date   CHOLHDL 3 11/26/2015   No results found for: HGBA1C    I acted as a Education administrator for Dr. Charlett Blake. [place]ord Noble, [place]  Assessment & Plan:   Problem List Items Addressed This Visit    TOBACCO ABUSE    Encouraged complete cessation. Discussed need to quit as relates to risk of numerous cancers, cardiac and pulmonary disease as well as neurologic complications. Counseled for greater than 3 minutes Quit smoking in 2017      Rheumatoid arthritis ([place])    Orencia shots. Has been trying to not taking the MTX. She will continue Fentanyl, Oxycodone       Relevant Medications   oxyCODONE-acetaminophen (PERCOCET) 10-325 MG tablet   fentaNYL (DURAGESIC - DOSED MCG/HR) 100 MCG/HR   Other Relevant Orders   Comprehensive metabolic panel   Hypertension    Well controlled, no changes to meds. Encouraged heart healthy diet such as the DASH diet and exercise as tolerated.       Relevant Orders   CBC   Comprehensive metabolic panel   Lipid panel   TSH   Pain    Encouraged to stay as active as tolerated and may continue current meds      Relevant Medications   oxyCODONE-acetaminophen (PERCOCET) 10-325 MG tablet   Hyperglycemia - Primary    hgba1c acceptable, minimize simple carbs. Increase exercise as tolerated. Continue current meds      Relevant Orders   Hemoglobin A1c      I have changed Ms. Szymborski's oxyCODONE-acetaminophen and fentaNYL. I am also having her maintain her abatacept, ibuprofen, citalopram, gabapentin, predniSONE, albuterol, losartan-hydrochlorothiazide, and CARTIA XT.  Meds ordered this encounter  Medications  .  oxyCODONE-acetaminophen (PERCOCET) 10-325 MG tablet    Sig: Take 2 tablets by mouth every 4 (four) hours as needed for pain. May fill on 06/17/2016    Dispense:  180 tablet    Refill:  0  . fentaNYL (DURAGESIC - DOSED MCG/HR) 100 MCG/HR    Sig: Place 1 patch (100 mcg total) onto the skin every other day. 06/17/2016    Dispense:  15 patch    Refill:  0    CMA served as scribe during this visit. History, Physical and Plan performed by medical provider. Documentation and orders reviewed and attested to.  Penni Homans, MD

## 2016-06-01 NOTE — Progress Notes (Signed)
Patient ID: Jennifer Kelly, female   DOB: [birth year], 56 y.o.   MRN: [id number]

## 2016-06-01 NOTE — Patient Instructions (Signed)
Carbohydrate Counting for Diabetes Mellitus, Adult Carbohydrate counting is a method for keeping track of how many carbohydrates you eat. Eating carbohydrates naturally increases the amount of sugar (glucose) in the blood. Counting how many carbohydrates you eat helps keep your blood glucose within normal limits, which helps you manage your diabetes (diabetes mellitus). It is important to know how many carbohydrates you can safely have in each meal. This is different for every person. A diet and nutrition specialist (registered dietitian) can help you make a meal plan and calculate how many carbohydrates you should have at each meal and snack. Carbohydrates are found in the following foods:  Grains, such as breads and cereals.  Dried beans and soy products.  Starchy vegetables, such as potatoes, peas, and corn.  Fruit and fruit juices.  Milk and yogurt.  Sweets and snack foods, such as cake, cookies, candy, chips, and soft drinks. How do I count carbohydrates? There are two ways to count carbohydrates in food. You can use either of the methods or a combination of both. Reading "Nutrition Facts" on packaged food  The "Nutrition Facts" list is included on the labels of almost all packaged foods and beverages in the [place] It includes:  The serving size.  Information about nutrients in each serving, including the grams (g) of carbohydrate per serving. To use the "Nutrition Facts":  Decide how many servings you will have.  Multiply the number of servings by the number of carbohydrates per serving.  The resulting number is the total amount of carbohydrates that you will be having. Learning standard serving sizes of other foods  When you eat foods containing carbohydrates that are not packaged or do not include "Nutrition Facts" on the label, you need to measure the servings in order to count the amount of carbohydrates:  Measure the foods that you will eat with a food scale or measuring  cup, if needed.  Decide how many standard-size servings you will eat.  Multiply the number of servings by 15. Most carbohydrate-rich foods have about 15 g of carbohydrates per serving.  For example, if you eat 8 oz (170 g) of strawberries, you will have eaten 2 servings and 30 g of carbohydrates (2 servings x 15 g = 30 g).  For foods that have more than one food mixed, such as soups and casseroles, you must count the carbohydrates in each food that is included. The following list contains standard serving sizes of common carbohydrate-rich foods. Each of these servings has about 15 g of carbohydrates:   hamburger bun or  English muffin.   oz (15 mL) syrup.   oz (14 g) jelly.  1 slice of bread.  1 six-inch tortilla.  3 oz (85 g) cooked rice or pasta.  4 oz (113 g) cooked dried beans.  4 oz (113 g) starchy vegetable, such as peas, corn, or potatoes.  4 oz (113 g) hot cereal.  4 oz (113 g) mashed potatoes or  of a large baked potato.  4 oz (113 g) canned or frozen fruit.  4 oz (120 mL) fruit juice.  4-6 crackers.  6 chicken nuggets.  6 oz (170 g) unsweetened dry cereal.  6 oz (170 g) plain fat-free yogurt or yogurt sweetened with artificial sweeteners.  8 oz (240 mL) milk.  8 oz (170 g) fresh fruit or one small piece of fruit.  24 oz (680 g) popped popcorn. Example of carbohydrate counting Sample meal  3 oz (85 g) chicken breast.  6 oz (  170 g) brown rice.  4 oz (113 g) corn.  8 oz (240 mL) milk.  8 oz (170 g) strawberries with sugar-free whipped topping. Carbohydrate calculation 1. Identify the foods that contain carbohydrates:  Rice.  Corn.  Milk.  Strawberries. 2. Calculate how many servings you have of each food:  2 servings rice.  1 serving corn.  1 serving milk.  1 serving strawberries. 3. Multiply each number of servings by 15 g:  2 servings rice x 15 g = 30 g.  1 serving corn x 15 g = 15 g.  1 serving milk x 15 g = 15  g.  1 serving strawberries x 15 g = 15 g. 4. Add together all of the amounts to find the total grams of carbohydrates eaten:  30 g + 15 g + 15 g + 15 g = 75 g of carbohydrates total. This information is not intended to replace advice given to you by your health care provider. Make sure you discuss any questions you have with your health care provider. Document Released: 05/08/2005 Document Revised: 11/26/2015 Document Reviewed: 10/20/2015 Elsevier Interactive Patient Education  2017 Elsevier Inc.  

## 2016-06-01 NOTE — Assessment & Plan Note (Addendum)
Encouraged complete cessation. Discussed need to quit as relates to risk of numerous cancers, cardiac and pulmonary disease as well as neurologic complications. Counseled for greater than 3 minutes Quit smoking in 2017

## 2016-06-01 NOTE — Assessment & Plan Note (Signed)
Orencia shots. Has been trying to not taking the MTX. She will continue Fentanyl, Oxycodone

## 2016-06-05 NOTE — Assessment & Plan Note (Signed)
Encouraged to stay as active as tolerated and may continue current meds

## 2016-06-05 NOTE — Assessment & Plan Note (Signed)
hgba1c acceptable, minimize simple carbs. Increase exercise as tolerated. Continue current meds 

## 2016-06-06 ENCOUNTER — Other Ambulatory Visit (INDEPENDENT_AMBULATORY_CARE_PROVIDER_SITE_OTHER): Payer: 59

## 2016-06-06 ENCOUNTER — Other Ambulatory Visit: Payer: Self-pay | Admitting: Family Medicine

## 2016-06-06 DIAGNOSIS — M069 Rheumatoid arthritis, unspecified: Secondary | ICD-10-CM

## 2016-06-06 DIAGNOSIS — I10 Essential (primary) hypertension: Secondary | ICD-10-CM | POA: Diagnosis not present

## 2016-06-06 DIAGNOSIS — R739 Hyperglycemia, unspecified: Secondary | ICD-10-CM | POA: Diagnosis not present

## 2016-06-06 LAB — CBC
HEMATOCRIT: 41.1 % (ref 36.0–46.0)
Hemoglobin: 14.1 g/dL (ref 12.0–15.0)
MCHC: 34.2 g/dL (ref 30.0–36.0)
MCV: 87.1 fl (ref 78.0–100.0)
Platelets: 269 10*3/uL (ref 150.0–400.0)
RBC: 4.73 Mil/uL (ref 3.87–5.11)
RDW: 15.4 % (ref 11.5–15.5)
WBC: 9.2 10*3/uL (ref 4.0–10.5)

## 2016-06-06 LAB — TSH: TSH: 2.3 u[IU]/mL (ref 0.35–4.50)

## 2016-06-06 LAB — LIPID PANEL
CHOLESTEROL: 182 mg/dL (ref 0–200)
HDL: 77.5 mg/dL (ref >39.00)
LDL CALC: 89 mg/dL (ref 0–99)
NONHDL: 104.95
Total CHOL/HDL Ratio: 2
Triglycerides: 80 mg/dL (ref 0.0–149.0)
VLDL: 16 mg/dL (ref 0.0–40.0)

## 2016-06-06 LAB — HEMOGLOBIN A1C: HEMOGLOBIN A1C: 5.8 % (ref 4.6–6.5)

## 2016-06-06 LAB — COMPREHENSIVE METABOLIC PANEL
ALBUMIN: 3.7 g/dL (ref 3.5–5.2)
ALK PHOS: 91 U/L (ref 39–117)
ALT: 24 U/L (ref 0–35)
AST: 19 U/L (ref 0–37)
BUN: 12 mg/dL (ref 6–23)
CO2: 31 mEq/L (ref 19–32)
CREATININE: 0.59 mg/dL (ref 0.40–1.20)
Calcium: 9.2 mg/dL (ref 8.4–10.5)
Chloride: 100 mEq/L (ref 96–112)
GFR: 111.77 mL/min (ref >60.00)
GLUCOSE: 114 mg/dL — AB (ref 70–99)
POTASSIUM: 3.8 meq/L (ref 3.5–5.1)
Sodium: 137 mEq/L (ref 135–145)
TOTAL PROTEIN: 7.6 g/dL (ref 6.0–8.3)
Total Bilirubin: 0.8 mg/dL (ref 0.2–1.2)

## 2016-06-16 ENCOUNTER — Telehealth: Payer: Self-pay | Admitting: Family Medicine

## 2016-06-16 NOTE — Telephone Encounter (Signed)
I will sign form when available

## 2016-06-16 NOTE — Telephone Encounter (Addendum)
Relation to [id number] Call back number:[phone]   Reason for call:  Patient emailed [employer] Screening form, informed patient 5 to 7 business day turnaround patient would like to pick up when ready, form placed in paperwork basket located at the front desk.

## 2016-06-26 ENCOUNTER — Telehealth: Payer: Self-pay | Admitting: Family Medicine

## 2016-06-26 NOTE — Telephone Encounter (Signed)
Relation to [id number] Call back number:[phone]  Reason for call:  Patient contacted insurance and was informed PA needed for fentanyl patches and would like nurse to call insurance provider line [phone], please advise

## 2016-06-27 ENCOUNTER — Other Ambulatory Visit: Payer: Self-pay | Admitting: Family Medicine

## 2016-06-27 NOTE — Telephone Encounter (Signed)
Will take care of asap.

## 2016-06-27 NOTE — Telephone Encounter (Signed)
Last refill on 11/17/2015  #90 with 0 refills Last office visit 06/01/2016

## 2016-06-28 NOTE — Telephone Encounter (Signed)
Pt called back in she said that she spoke with insurance and they gave her an emergency PA override phone number to call: [phone] she said that it is the doc line.

## 2016-06-28 NOTE — Telephone Encounter (Signed)
Pt called in to follow up on PA. I assured that CMA is working on PA for her. She would like to know if she can have a call back just for confirmation on things. Also, pt said that she will be out soon.    CB: [phone]

## 2016-06-29 NOTE — Telephone Encounter (Signed)
PA initiated via Covermymeds; KEY: [id number]. Awaiting determination.

## 2016-06-30 ENCOUNTER — Other Ambulatory Visit: Payer: Self-pay

## 2016-06-30 MED ORDER — DURAGESIC-100 100 MCG/HR TD PT72: 100.0000 ug | MEDICATED_PATCH | TRANSDERMAL | 0 refills | Status: DC

## 2016-07-03 ENCOUNTER — Telehealth: Payer: Self-pay

## 2016-07-03 NOTE — Telephone Encounter (Signed)
PA started. Awaiting response for fentanyl.

## 2016-07-04 NOTE — Telephone Encounter (Signed)
Kim from [facility] is calling about the PA for patients Fentanyl. Request that PA be called to [phone]. The reference # is [id number]. Plse request that it be completed ASAP.

## 2016-07-04 NOTE — Telephone Encounter (Signed)
Patient checking on the status of message below, patient has additional information best # [phone]

## 2016-07-04 NOTE — Telephone Encounter (Signed)
Spoke with Bessie in the PA department, she stated that the medication could not be prescribed but the authorization did not express an alternative for the medication.  pc

## 2016-07-05 NOTE — Telephone Encounter (Signed)
Joshlyn from [facility] would like to start a new PA for the patients Fentanyl. She states that a lot of information was left out of the original request for PA. States that 13 questions were not answered. Case # [id number].

## 2016-07-06 ENCOUNTER — Telehealth: Payer: Self-pay

## 2016-07-06 NOTE — Telephone Encounter (Signed)
Joshlyn from [facility] called to inform that the fax they received this morning was a duplicate of the one they had already received and there is still unanswered questions.

## 2016-07-06 NOTE — Telephone Encounter (Signed)
Patient called and stated that she talked to her insurance company and stated that if the RX is written for 75 MCG  #10 patches a month, 1 patch every third day.  Please advise  PC

## 2016-07-07 ENCOUNTER — Ambulatory Visit (INDEPENDENT_AMBULATORY_CARE_PROVIDER_SITE_OTHER): Payer: 59 | Admitting: Medical

## 2016-07-07 ENCOUNTER — Other Ambulatory Visit: Payer: Self-pay | Admitting: Family Medicine

## 2016-07-07 ENCOUNTER — Encounter: Payer: Self-pay | Admitting: Medical

## 2016-07-07 VITALS — BP 135/74 | HR 76 | Temp 98.4°F | Ht 72.0 in | Wt 233.8 lb

## 2016-07-07 DIAGNOSIS — Z1159 Encounter for screening for other viral diseases: Secondary | ICD-10-CM

## 2016-07-07 DIAGNOSIS — R739 Hyperglycemia, unspecified: Secondary | ICD-10-CM

## 2016-07-07 DIAGNOSIS — M069 Rheumatoid arthritis, unspecified: Secondary | ICD-10-CM | POA: Diagnosis not present

## 2016-07-07 MED ORDER — FENTANYL 75 MCG/HR TD PT72: 75.0000 ug | MEDICATED_PATCH | TRANSDERMAL | 0 refills | Status: DC

## 2016-07-07 NOTE — Telephone Encounter (Signed)
Per Dr. Charlett Blake she will write the rx for the 83mcg. Changing the  Patches every 3 days.  Disp #10patches.  PC

## 2016-07-07 NOTE — Patient Instructions (Addendum)
Regarding your hep c antibody study, I have put in a future lab. I want you to call your insurance and verify that they will cover the test.  For your RA continue orencia and methotrexate.(regarding flu vaccine ask you specialist office if they recommend and we can give if you decide you want)  In mid April will be able to get a1c. Low sugar diet.  Follow up with Dr Charlett Blake as regularly scheduled or as needed

## 2016-07-07 NOTE — Addendum Note (Signed)
Addended by: Anabel Halon on: 07/07/2016 04:13 PM   Modules accepted: Orders, Level of Service

## 2016-07-07 NOTE — Progress Notes (Signed)
Subjective:    Patient ID: Jennifer Kelly, female    DOB: 1960-11-24, 56 y.o.   MRN: [id number]  HPI   Pt in and states she was reviewing her maintenance issues to update and saw hep C antibody needed. Pt not sure if insurance will pay for study.  Also t in past has had mild high sugar levels. No hyperglycemic symptoms. Family history of diabetes.Pt a1-c has been check one month ago and was 5.8.  Pt never gets flu vaccine . Pt is on orencia. and methotrexate. She presently want to not get vaccine.    Review of Systems  Constitutional: Negative for chills, fatigue and fever.  HENT: Negative for congestion, ear discharge, ear pain, nosebleeds, postnasal drip and rhinorrhea.   Respiratory: Negative for cough, chest tightness, shortness of breath and wheezing.   Cardiovascular: Negative for chest pain and palpitations.  Gastrointestinal: Negative for abdominal pain.  Musculoskeletal: Negative for back pain.       Daily joint pains.  Skin: Negative for rash.  Hematological: Negative for adenopathy. Does not bruise/bleed easily.  Psychiatric/Behavioral: Negative for behavioral problems, confusion, sleep disturbance and suicidal ideas. The patient is not nervous/anxious.     Past Medical History:  Diagnosis Date  . Acute bronchitis 01/05/2014  . Arthritis   . Chicken pox as a child  . Dermatitis 09/09/2014  . Ganglion cyst of left foot 09/09/2014  . H/O mumps   . History of chicken pox   . Hyperglycemia 11/30/2015  . Hypertension   . Mumps as a child  . Shingles 2014     Social History   Social History  . Marital status: Married    Spouse name: N/A  . Number of children: N/A  . Years of education: N/A   Occupational History  . Not on file.   Social History Main Topics  . Smoking status: Former Smoker    Start date: 11/30/2015  . Smokeless tobacco: Never Used  . Alcohol use Yes     Comment: occ wine  . Drug use: No  . Sexual activity: No     Comment: no dietary  restrictions. lives alone   Other Topics Concern  . Not on file   Social History Narrative  . No narrative on file    Past Surgical History:  Procedure Laterality Date  . BACK SURGERY  2003  . NECK SURGERY  02/17/2016   C4, C5 & C6  . rotater cuff  20 yrs ago   left shoulder  . TOOTH EXTRACTION  05/2012  . WISDOM TOOTH EXTRACTION  56 yrs old    Family History  Problem Relation Age of Onset  . Parkinson's disease Mother   . Heart disease Mother   . Hypertension Mother   . Diabetes Father     type 2  . Hypertension Father   . Stroke Father   . Vascular Disease Brother   . Hypertension Brother   . Diabetes Brother   . Heart attack Paternal Uncle     No Known Allergies  Current Outpatient Prescriptions on File Prior to Visit  Medication Sig Dispense Refill  . abatacept (ORENCIA) 250 MG injection Inject into the vein once a week.    Marland Kitchen albuterol (VENTOLIN HFA) 108 (90 Base) MCG/ACT inhaler inhale 2 puffs by mouth every 6 hours if needed for wheezing FOR WHEEZING. 18 Inhaler 8  . CARTIA XT 240 MG 24 hr capsule take 1 capsule by mouth once daily 30 capsule 5  .  citalopram (CELEXA) 20 MG tablet take 1 tablet by mouth once daily 30 tablet 11  . DURAGESIC 100 MCG/HR Place 1 patch (100 mcg total) onto the skin every 3 (three) days. 10 patch 0  . gabapentin (NEURONTIN) 600 MG tablet take 1 tablet by mouth three times a day 90 tablet 1  . ibuprofen (ADVIL,MOTRIN) 800 MG tablet     . losartan-hydrochlorothiazide (HYZAAR) 50-12.5 MG tablet take 1 tablet by mouth once daily 30 tablet 5  . oxyCODONE-acetaminophen (PERCOCET) 10-325 MG tablet Take 2 tablets by mouth every 4 (four) hours as needed for pain. May fill on 06/17/2016 180 tablet 0  . predniSONE (DELTASONE) 5 MG tablet Take 1 tablet by mouth daily as needed.   0   No current facility-administered medications on file prior to visit.     BP 135/74   Pulse 76   Temp 98.4 F (36.9 C) (Oral)   Ht 6' (1.829 m)   Wt 233 lb 12.8  oz (106.1 kg)   SpO2 96%   BMI 31.71 kg/m       Objective:   Physical Exam  General- No acute distress. Pleasant patient. Neck- Full range of motion, no jvd Lungs- Clear, even and unlabored. Heart- regular rate and rhythm. Neurologic- CNII- XII grossly intact.       Assessment & Plan:  Regarding your hep c antibody study, I have put in a future lab. I want you to call your insurance and verify that they will cover the test.  For your RA continue orencia and methotrexate.(regarding flu vaccine ask you specialist office if they recommend and we can give if you decide you want)  In mid April will be able to get a1c. Low sugar diet.  Follow up with Dr Charlett Blake as regularly scheduled or as needed   Jazlin Tapscott, Percell Miller, PA-C

## 2016-07-07 NOTE — Progress Notes (Signed)
Pre visit review using our clinic review tool, if applicable. No additional management support is needed unless otherwise documented below in the visit note. 

## 2016-07-13 ENCOUNTER — Telehealth: Payer: Self-pay | Admitting: Family Medicine

## 2016-07-13 DIAGNOSIS — M069 Rheumatoid arthritis, unspecified: Secondary | ICD-10-CM

## 2016-07-13 DIAGNOSIS — R52 Pain, unspecified: Secondary | ICD-10-CM

## 2016-07-13 NOTE — Telephone Encounter (Signed)
°  Relation to [id number] Call back number: [phone] Pharmacy:  Reason for call: pt is needing rx  oxyCODONE-acetaminophen (PERCOCET) 10-325 MG tablet, pt states rx not due til Monday however she will need to pick up on tomorrow.

## 2016-07-13 NOTE — Telephone Encounter (Signed)
Ok to refill requested med 

## 2016-07-13 NOTE — Telephone Encounter (Signed)
Requesting:  Oxycodone Contract    9/06/26/2013 UDS   Low risk--next is due 10/20/2016 Last OV    06/01/2016--- scheduled 01/02/2017 Last Refill   #180 on 06/01/2016  Please Advise

## 2016-07-14 MED ORDER — OXYCODONE-ACETAMINOPHEN 10-325 MG PO TABS: 2.0000 | ORAL_TABLET | ORAL | 0 refills | Status: DC | PRN

## 2016-07-14 NOTE — Telephone Encounter (Signed)
Printed and on counter for signature. 

## 2016-07-14 NOTE — Telephone Encounter (Signed)
Called the patient informed to pickup hardcopy at the front desk. 

## 2016-07-17 NOTE — Telephone Encounter (Signed)
[facility] in [place] called today to inform due to better prices she is changing to them for her controlled. Felt it needed to be documented as on her last contract [facility] [place].

## 2016-07-17 NOTE — Telephone Encounter (Signed)
When she picks up refill will need to update contract

## 2016-08-02 ENCOUNTER — Telehealth: Payer: Self-pay | Admitting: Family Medicine

## 2016-08-02 NOTE — Telephone Encounter (Signed)
°  Relation to JR:PZPS Call back [phone] Pharmacy:  Reason for call:  Patient requesting a refill fentaNYL (DURAGESIC) 75 MCG/HR

## 2016-08-03 ENCOUNTER — Encounter: Payer: Self-pay | Admitting: Family Medicine

## 2016-08-03 MED ORDER — FENTANYL 75 MCG/HR TD PT72: 75.0000 ug | MEDICATED_PATCH | TRANSDERMAL | 0 refills | Status: DC

## 2016-08-03 NOTE — Telephone Encounter (Signed)
Printed hardcopy/contract/patient informed to pickup hardcoy/complete contract at the front desk.

## 2016-08-03 NOTE — Telephone Encounter (Signed)
Requesting:   Fentanyl 75 Contract  Signed on 02/13/2014---needs updating UDS  Done on 21/05/2015 result low risk--next is due 10/20/2016 Last OV   06/01/2016---next scheduled appt is 01/02/2017 Last Refill   #10 on 07/07/2016  Please Advise

## 2016-08-03 NOTE — Telephone Encounter (Signed)
OK to refill the Fentanyl and update contract

## 2016-08-07 ENCOUNTER — Telehealth: Payer: Self-pay

## 2016-08-07 NOTE — Telephone Encounter (Signed)
PA initiated via Covermymeds; KEY: [id number]. Awaiting determination.

## 2016-08-08 NOTE — Telephone Encounter (Signed)
Request Reference Number: [id number]. FENTANYL DIS 75MCG/HR is approved through 02/07/2017. For further questions, call [phone]

## 2016-08-08 NOTE — Telephone Encounter (Signed)
[facility] informed of PA approval. Received PA approval notification, sent for scanning.

## 2016-08-14 ENCOUNTER — Telehealth: Payer: Self-pay | Admitting: Family Medicine

## 2016-08-14 DIAGNOSIS — R52 Pain, unspecified: Secondary | ICD-10-CM

## 2016-08-14 DIAGNOSIS — M069 Rheumatoid arthritis, unspecified: Secondary | ICD-10-CM

## 2016-08-14 MED ORDER — OXYCODONE-ACETAMINOPHEN 10-325 MG PO TABS: 2.0000 | ORAL_TABLET | ORAL | 0 refills | Status: DC | PRN

## 2016-08-14 NOTE — Telephone Encounter (Signed)
Ok to refill requested med 

## 2016-08-14 NOTE — Telephone Encounter (Signed)
Requesting:   Oxycodone Contract    02/13/2014 UDS   Low risk---next is due on 10/20/2016 Last OV    06/01/2016----next appt is on 01/02/2017 Last Refill     #180 no refills on 07/14/2016  Please Advise

## 2016-08-14 NOTE — Telephone Encounter (Signed)
Printed hardcopy and called the patient informed to pickup hardcopy at the front desk. Had to leave a message

## 2016-08-14 NOTE — Telephone Encounter (Signed)
Caller name: Eliette Drumwright Relationship to patient: self Can be reached: [phone]  Reason for call: Pt needing refill on oxycodone. Will need to pick up tomorrow. Please call when ready.

## 2016-09-01 ENCOUNTER — Encounter: Payer: Self-pay | Admitting: Family Medicine

## 2016-09-01 ENCOUNTER — Telehealth: Payer: Self-pay | Admitting: Family Medicine

## 2016-09-01 MED ORDER — FENTANYL 75 MCG/HR TD PT72: 75.0000 ug | MEDICATED_PATCH | TRANSDERMAL | 0 refills | Status: DC

## 2016-09-01 NOTE — Telephone Encounter (Signed)
Printed prescription/contract/pcp signed. Called the patient informed to pickup hardcopy/do contract.

## 2016-09-01 NOTE — Telephone Encounter (Signed)
OK to refill needs a new contract update

## 2016-09-01 NOTE — Telephone Encounter (Signed)
Caller name: Relationship to patient: Self Can be reached: [phone]  Pharmacy:  [facility], [place] [phone] (Phone) [phone] (Fax)     Reason for call: Refill fentaNYL ([place]) 75 MCG/HR

## 2016-09-01 NOTE — Telephone Encounter (Signed)
Requesting:   Fentanyl 75 patch Contract    02/13/2014 UDS   Low risk----next is due on 10/20/2016 Last OV    06/01/2016-----future appointment is 01/02/2017 Last Refill   #10 no refills on 08/03/2016  Please Advise

## 2016-09-04 ENCOUNTER — Other Ambulatory Visit: Payer: Self-pay | Admitting: Family Medicine

## 2016-09-12 ENCOUNTER — Telehealth: Payer: Self-pay | Admitting: Family Medicine

## 2016-09-12 DIAGNOSIS — M069 Rheumatoid arthritis, unspecified: Secondary | ICD-10-CM

## 2016-09-12 DIAGNOSIS — R52 Pain, unspecified: Secondary | ICD-10-CM

## 2016-09-12 NOTE — Telephone Encounter (Signed)
Requesting:   Oxycodone Contract    09/01/2016 UDS   Low risk   Next is due 11/09/2016 Last OV    06/01/2016---future appt 01/02/2017 Last Refill     #180 no refills on 08/14/16  Please Advise

## 2016-09-12 NOTE — Telephone Encounter (Signed)
OK to refill requested med 

## 2016-09-12 NOTE — Telephone Encounter (Signed)
°  Relation to TX:HFSF Call back number:[phone] Pharmacy:  Reason for call:  Patient requesting a refill oxyCODONE-acetaminophen (PERCOCET) 10-325 MG tablet

## 2016-09-13 MED ORDER — OXYCODONE-ACETAMINOPHEN 10-325 MG PO TABS: 2.0000 | ORAL_TABLET | ORAL | 0 refills | Status: DC | PRN

## 2016-09-13 NOTE — Telephone Encounter (Signed)
PCP approved and will sign on 09/14/16 Patient notified can pickup on 09/14/16.

## 2016-09-20 ENCOUNTER — Encounter: Payer: Self-pay | Admitting: Internal Medicine

## 2016-09-21 ENCOUNTER — Other Ambulatory Visit: Payer: Self-pay | Admitting: Family Medicine

## 2016-09-27 ENCOUNTER — Other Ambulatory Visit: Payer: Self-pay | Admitting: Family Medicine

## 2016-10-02 ENCOUNTER — Telehealth: Payer: Self-pay | Admitting: Family Medicine

## 2016-10-02 NOTE — Telephone Encounter (Signed)
OK to refill

## 2016-10-02 NOTE — Telephone Encounter (Signed)
Caller name: Araya Relation to pt: Self  Call back number: [phone] Pharmacy: [facility], [place]  Reason for call: Pt is requesting rx for fentaNYL ([place]) 75 MCG/HR, let pt know when ready. Please advise.

## 2016-10-02 NOTE — Telephone Encounter (Signed)
Requesting:   fentanyl Contract    09/01/2016 UDS   Low risk next is due on 10/20/2016 Last OV   06/01/2016 Last Refill   #10 no refills on 09/01/2016  Please Advise

## 2016-10-03 MED ORDER — FENTANYL 75 MCG/HR TD PT72: 75.0000 ug | MEDICATED_PATCH | TRANSDERMAL | 0 refills | Status: DC

## 2016-10-03 NOTE — Telephone Encounter (Signed)
Faxed hardcopy for Fentanyl patch to [facility]

## 2016-10-03 NOTE — Telephone Encounter (Signed)
My mistake hardcopy was faxed and cannot be. Patient will pickup at the front desk tomorrow 10/04/2016. She is notified.

## 2016-10-07 ENCOUNTER — Other Ambulatory Visit: Payer: Self-pay | Admitting: Family Medicine

## 2016-10-11 ENCOUNTER — Telehealth: Payer: Self-pay | Admitting: Family Medicine

## 2016-10-11 DIAGNOSIS — R52 Pain, unspecified: Secondary | ICD-10-CM

## 2016-10-11 DIAGNOSIS — M069 Rheumatoid arthritis, unspecified: Secondary | ICD-10-CM

## 2016-10-11 NOTE — Telephone Encounter (Signed)
Caller name: Relationship to patient: Self Can be reached: [phone]  Pharmacy:  Reason for call: Refill oxyCODONE-acetaminophen (PERCOCET) 10-325 MG tablet

## 2016-10-12 MED ORDER — OXYCODONE-ACETAMINOPHEN 10-325 MG PO TABS: 2.0000 | ORAL_TABLET | ORAL | 0 refills | Status: DC | PRN

## 2016-10-12 NOTE — Telephone Encounter (Signed)
Patient was given her first infusion for RA (simponi infusion). Since then has a rash from head to toe.  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  She knows PCP cannot do anything about this but just wanted PCP to be aware She is seeing her dermatologist June 12 and is trying to get moved up sooner. FYI for PCP.

## 2016-10-12 NOTE — Telephone Encounter (Signed)
Requesting:   oxycodone Contract     09/01/16 UDS     Low risk on 10/20/16 Last OV     06/01/16 Last Refill    #180 on 09/13/16  Please Advise

## 2016-10-12 NOTE — Telephone Encounter (Signed)
OK to refill

## 2016-10-12 NOTE — Telephone Encounter (Signed)
Called the patient informed to pickup hardcopy at the front desk. 

## 2016-10-23 ENCOUNTER — Encounter: Payer: Self-pay | Admitting: Internal Medicine

## 2016-10-23 ENCOUNTER — Ambulatory Visit (INDEPENDENT_AMBULATORY_CARE_PROVIDER_SITE_OTHER): Payer: 59 | Admitting: Internal Medicine

## 2016-10-23 VITALS — BP 140/90 | HR 80 | Ht 71.5 in | Wt 250.2 lb

## 2016-10-23 DIAGNOSIS — Z1211 Encounter for screening for malignant neoplasm of colon: Secondary | ICD-10-CM | POA: Diagnosis not present

## 2016-10-23 DIAGNOSIS — R1013 Epigastric pain: Secondary | ICD-10-CM | POA: Diagnosis not present

## 2016-10-23 NOTE — Progress Notes (Signed)
HISTORY OF PRESENT ILLNESS:  Jennifer Kelly is a 56 y.o. female with history of hypertension and rheumatoid arthritis who is referred by her rheumatologist Dr. Dossie Der for evaluation of transient lower chest/epigastric pain. Patient reports a long history of rheumatoid arthritis for which she is on multiple agents. Most recently she received an infusion of Simponi. Within a week she had several issues including rash. About 3 days after her infusion she reports developing severe lower chest and epigastric pain [place] the [place]. The pain was described as a burning soreness pain located anterior and focal without radiation. No associated nausea, vomiting, or diaphoresis. She took multiple antacids and reports that her problem resolved over the course of several hours. She denies having had similar problems previously. She has had no problems since. She denies indigestion, heartburn, pyrosis, or dysphagia. Weight has been stable. GI review of systems otherwise negative. She denies NSAIDs but takes narcotics for chronic pain. She was quit his regarding colon cancer screening and tells me that she plans to do Cologuard through her PCP. She wanted to discuss this somewhat.  REVIEW OF SYSTEMS:  All non-GI ROS negative unless otherwise listed in the history of present illness except for arthritis, itching, skin rash, ankle swelling  Past Medical History:  Diagnosis Date  . Acute bronchitis 01/05/2014  . Arthritis   . Chicken pox as a child  . Dermatitis 09/09/2014  . Ganglion cyst of left foot 09/09/2014  . H/O mumps   . History of chicken pox   . Hyperglycemia 11/30/2015  . Hypertension   . Mumps as a child  . Psoriatic arthritis ([place])   . Rheumatoid arthritis ([place])   . Shingles 2014    Past Surgical History:  Procedure Laterality Date  . BACK SURGERY  2003  . NECK SURGERY  02/17/2016   C4, C5 & C6  . rotater cuff  20 yrs ago   left shoulder  . TOOTH EXTRACTION  05/2012  .  WISDOM TOOTH EXTRACTION  56 yrs old    Social History Lux CHELSEI MCCHESNEY  reports that she has quit smoking. She started smoking about 10 months ago. She has never used smokeless tobacco. She reports that she drinks alcohol. She reports that she does not use drugs.  family history includes Diabetes in her brother and father; Heart attack in her paternal uncle; Heart disease in her mother; Hypertension in her brother, father, and mother; Parkinson's disease in her mother; Stroke in her father; Vascular Disease in her brother.  No Known Allergies     PHYSICAL EXAMINATION: Vital signs: BP 140/90   Pulse 80   Ht 5' 11.5" (1.816 m)   Wt 250 lb 4 oz (113.5 kg)   BMI 34.42 kg/m   Constitutional: Pleasant, obese, generally well-appearing, no acute distress Psychiatric: alert and oriented x3, cooperative Eyes: extraocular movements intact, anicteric, conjunctiva pink Mouth: oral pharynx moist, no lesions Neck: supple no lymphadenopathy Cardiovascular: heart regular rate and rhythm, no murmur Lungs: clear to auscultation bilaterally Abdomen: soft, nontender, nondistended, no obvious ascites, no peritoneal signs, normal bowel sounds, no organomegaly Rectal: Omitted Extremities: no clubbing or cyanosis. Trace lower extremity edema bilaterally Skin: Scaly skin. Otherwise no lesions on visible extremities Neuro: No focal deficits. Cranial nerves intact  ASSESSMENT:  #1. Focal episode of lower chest and epigastric pain as described. Complete resolution without recurrence. Etiology unclear. This is not typical for GERD or ulcer disease. Rule out musculoskeletal, rule out gallbladder. Not sure this  is related to her infusion therapy for arthritis #2. Colon cancer screening. She wanted to discuss   PLAN:  #1. Abdominal ultrasound to rule out gallstones and evaluate further causes of pain. We will contact her with results when available and any further recommendations if necessary #2. Present for  evaluation should the pain return. If severe would recommend emergency room #3. Discussed optical colonoscopy versus Cologuard in detail. Multiple questions answered. She will contemplate her choice. If she chooses optical colonoscopy, we will be happy to assist in scheduling   a copy of this consultation note has been sent to Dr. Dossie Der

## 2016-10-23 NOTE — Patient Instructions (Signed)
You have been scheduled for an abdominal ultrasound at [facility] (1st floor of hospital) on 10/26/2016 at 11:30am. Please arrive 15 minutes prior to your appointment for registration. Make certain not to have anything to eat or drink 8 hours prior to your appointment. Should you need to reschedule your appointment, please contact radiology at [phone]. This test typically takes about 30 minutes to perform.

## 2016-10-26 ENCOUNTER — Ambulatory Visit (HOSPITAL_COMMUNITY): Payer: 59

## 2016-10-31 ENCOUNTER — Ambulatory Visit (HOSPITAL_COMMUNITY)
Admission: RE | Admit: 2016-10-31 | Discharge: 2016-10-31 | Disposition: A | Discharge status: Home / Self Care | Payer: 59 | Source: Ambulatory Visit | Attending: Internal Medicine | Admitting: Internal Medicine

## 2016-10-31 DIAGNOSIS — K7689 Other specified diseases of liver: Secondary | ICD-10-CM | POA: Diagnosis not present

## 2016-10-31 DIAGNOSIS — R1013 Epigastric pain: Secondary | ICD-10-CM | POA: Diagnosis present

## 2016-11-02 ENCOUNTER — Telehealth: Payer: Self-pay | Admitting: Family Medicine

## 2016-11-02 ENCOUNTER — Other Ambulatory Visit: Payer: Self-pay | Admitting: Family Medicine

## 2016-11-02 MED ORDER — FENTANYL 75 MCG/HR TD PT72: 75.0000 ug | MEDICATED_PATCH | TRANSDERMAL | 0 refills | Status: DC

## 2016-11-02 NOTE — Telephone Encounter (Signed)
Requesting:    fentanyl Contract   09/01/2016 UDS   Low risk due on 10/20/16 Last OV    06/01/2016 Last Refill    #10 no refills on 10/03/16  Please Advise

## 2016-11-02 NOTE — Telephone Encounter (Signed)
I have printed, signed and left on your desk.

## 2016-11-02 NOTE — Telephone Encounter (Signed)
Caller name: Relationship to patient: Self Can be reached: [phone]  Pharmacy:  Reason for call: Refill fentaNYL (DURAGESIC) 75 MCG/HR

## 2016-11-03 NOTE — Telephone Encounter (Signed)
Called the patient informed to pickup hardcopy at the front desk at our office at her convenience. (had to leave a detailed message)

## 2016-11-08 ENCOUNTER — Other Ambulatory Visit: Payer: Self-pay | Admitting: Family Medicine

## 2016-11-09 ENCOUNTER — Telehealth: Payer: Self-pay | Admitting: Family Medicine

## 2016-11-09 DIAGNOSIS — M069 Rheumatoid arthritis, unspecified: Secondary | ICD-10-CM

## 2016-11-09 DIAGNOSIS — R52 Pain, unspecified: Secondary | ICD-10-CM

## 2016-11-09 MED ORDER — OXYCODONE-ACETAMINOPHEN 10-325 MG PO TABS: 2.0000 | ORAL_TABLET | ORAL | 0 refills | Status: DC | PRN

## 2016-11-09 NOTE — Telephone Encounter (Signed)
Relation to SU:GAYG Call back number:[phone]   Reason for call:  Patient requesting a refill oxyCODONE-acetaminophen (PERCOCET) 10-325 MG tablet

## 2016-11-09 NOTE — Telephone Encounter (Signed)
Printed as PCP instructed/she signed Called the patient left a message to call back

## 2016-11-09 NOTE — Telephone Encounter (Signed)
Requesting:   oxycodone Contract    09/01/2016 UDS   Low risk due on 10/20/2016 Last OV   06/01/2016---future appt is on 11/20/2016 Last Refill  #180 no refills on 10/12/2016  Please Advise

## 2016-11-09 NOTE — Telephone Encounter (Signed)
Patient informed of PCP instructions To pickup hardcopy She will discuss with PCP at her next appt.

## 2016-11-09 NOTE — Telephone Encounter (Signed)
OK to refill but let her know per new guidelines I will not be able to prescribe as many tabs per month. So she will need to space them out and she can have 150# this month and we can discuss more next month

## 2016-11-20 ENCOUNTER — Encounter: Payer: Self-pay | Admitting: Family Medicine

## 2016-11-20 ENCOUNTER — Ambulatory Visit (INDEPENDENT_AMBULATORY_CARE_PROVIDER_SITE_OTHER): Payer: 59 | Admitting: Family Medicine

## 2016-11-20 DIAGNOSIS — F17200 Nicotine dependence, unspecified, uncomplicated: Secondary | ICD-10-CM | POA: Diagnosis not present

## 2016-11-20 DIAGNOSIS — R52 Pain, unspecified: Secondary | ICD-10-CM

## 2016-11-20 DIAGNOSIS — M810 Age-related osteoporosis without current pathological fracture: Secondary | ICD-10-CM

## 2016-11-20 DIAGNOSIS — M069 Rheumatoid arthritis, unspecified: Secondary | ICD-10-CM

## 2016-11-20 DIAGNOSIS — I10 Essential (primary) hypertension: Secondary | ICD-10-CM | POA: Diagnosis not present

## 2016-11-20 DIAGNOSIS — E669 Obesity, unspecified: Secondary | ICD-10-CM | POA: Diagnosis not present

## 2016-11-20 DIAGNOSIS — M545 Low back pain: Secondary | ICD-10-CM | POA: Diagnosis not present

## 2016-11-20 HISTORY — DX: Age-related osteoporosis without current pathological fracture: M81.0

## 2016-11-20 MED ORDER — FENTANYL 75 MCG/HR TD PT72: 75.0000 ug | MEDICATED_PATCH | TRANSDERMAL | 0 refills | Status: DC

## 2016-11-20 MED ORDER — OXYCODONE-ACETAMINOPHEN 10-325 MG PO TABS: 2.0000 | ORAL_TABLET | ORAL | 0 refills | Status: DC | PRN

## 2016-11-20 NOTE — Patient Instructions (Addendum)
Tylenol/Acetaminophen ES 500 mg tabs,  Add a tab to each oxycodone tab daily Ibuprofen/Advil/Motrin tabs 200 mg tabs, 2 tabs every 4-6 hours daily Lidocaine gel to affected areas  Consider Prolia injections twice yearly  Recommend calcium intake of 1200 to 1500 mg daily, divided into roughly 3 doses. Best source is the diet and a single dairy serving is about 500 mg, a supplement of calcium citrate once or twice daily to balance diet is fine if not getting enough in diet. Also need Vitamin D 2000 IU caps, 1 cap daily if not already taking vitamin D. Also recommend weight baring exercise on hips and upper body to keep bones strong   Psoriasis Psoriasis is a long-term (chronic) skin condition. It causes raised, red patches (plaques) on your skin that look silvery. The red patches may show up anywhere on your body. They can be any size or shape. Psoriasis can come and go. It can range from mild to very bad. It cannot be passed from one person to another (not contagious). There is no cure for this condition, but it can be helped with treatment. Follow these instructions at home: Skin Care  Apply moisturizers to your skin as needed. Only use those that your doctor has said are okay.  Apply cool compresses to the affected areas.  Do not scratch your skin. Lifestyle   Do not use tobacco products. This includes cigarettes, chewing tobacco, and e-cigarettes. If you need help quitting, ask your doctor.  Drink little or no alcohol.  Try to lower your stress. Meditation or yoga may help.  Get sun as told by your doctor. Do not get sunburned.  Think about joining a psoriasis support group. Medicines  Take or use over-the-counter and prescription medicines only as told by your doctor.  If you were prescribed an antibiotic, take or use it as told by your doctor. Do not stop taking the antibiotic even if your condition starts to get better. General instructions  Keep a journal. Use this to  help track what triggers an outbreak. Try to avoid any triggers.  See a counselor or social worker if you feel very sad, upset, or hopeless about your condition and these feelings affect your work or relationships.  Keep all follow-up visits as told by your doctor. This is important. Contact a doctor if:  Your pain gets worse.  You have more redness or warmth in the affected areas.  You have new pain or stiffness in your joints.  Your pain or stiffness in your joints gets worse.  Your nails start to break easily.  Your nails pull away from the nail bed easily.  You have a fever.  You feel very sad (depressed). This information is not intended to replace advice given to you by your health care provider. Make sure you discuss any questions you have with your health care provider. Document Released: 06/15/2004 Document Revised: 10/14/2015 Document Reviewed: 09/23/2014 Elsevier Interactive Patient Education  2018 [employer].

## 2016-11-20 NOTE — Progress Notes (Signed)
Subjective:  I acted as a Education administrator for Dr. Charlett Blake. [name], [place]  Patient ID: Jennifer Kelly, female    DOB: 1961-04-21, 56 y.o.   MRN: [id number]  No chief complaint on file.   HPI  Patient is in today for a follow up visit. She continues to struggle with daily pain in her back and her RA is flared recently due to bad reactions to several meds. Developed a diffuse pruritic rash on Orencia with a secondary bacterial infection with staph and had to perform bleach baths. This is all resolved. Now. They tried MTX but she had severe nausea. With her history of steroid use for RA she has also developed osteoporosis. She is still under the care of Dr Sherwood Gambler of Neurosurgery. She continues to struggle with fatigue, SOB and ankle swelling but this is stable. Sees Dr Delman Cheadle for Derm, Dr Henrene Pastor for GI, Dr Donneta Romberg for Rheumatology and pulmonary as well. Denies CP/palp/SOB/HA/congestion/fevers/GI or GU c/o. Taking meds as prescribed.   Patient Care Team: Mosie Lukes, MD as PCP - General (Family Medicine) Jovita Gamma, MD (Neurosurgery) Unice Bailey, MD (Rheumatology) Elsie Saas, MD (Orthopedic Surgery)   Past Medical History:  Diagnosis Date  . Acute bronchitis 01/05/2014  . Arthritis   . Chicken pox as a child  . Dermatitis 09/09/2014  . Ganglion cyst of left foot 09/09/2014  . H/O mumps   . History of chicken pox   . Hyperglycemia 11/30/2015  . Hypertension   . Mumps as a child  . Osteoporosis 11/20/2016  . Psoriatic arthritis ([place])   . Rheumatoid arthritis ([place])   . Shingles 2014    Past Surgical History:  Procedure Laterality Date  . BACK SURGERY  2003  . NECK SURGERY  02/17/2016   C4, C5 & C6  . rotater cuff  20 yrs ago   left shoulder  . TOOTH EXTRACTION  05/2012  . WISDOM TOOTH EXTRACTION  56 yrs old    Family History  Problem Relation Age of Onset  . Parkinson's disease Mother   . Heart disease Mother   . Hypertension Mother   . Diabetes Father        type 2  .  Hypertension Father   . Stroke Father   . Vascular Disease Brother   . Hypertension Brother   . Diabetes Brother   . Heart attack Paternal Uncle     Social History   Social History  . Marital status: Divorced    Spouse name: N/A  . Number of children: 0  . Years of education: N/A   Occupational History  . help desk coordinator    Social History Main Topics  . Smoking status: Former Smoker    Start date: 11/30/2015  . Smokeless tobacco: Never Used  . Alcohol use Yes     Comment: occ wine  . Drug use: No  . Sexual activity: No     Comment: no dietary restrictions. lives alone   Other Topics Concern  . Not on file   Social History Narrative  . No narrative on file    Outpatient Medications Prior to Visit  Medication Sig Dispense Refill  . CARTIA XT 240 MG 24 hr capsule take 1 capsule by mouth once daily 30 capsule 5  . citalopram (CELEXA) 20 MG tablet take 1 tablet by mouth once daily 30 tablet 11  . gabapentin (NEURONTIN) 600 MG tablet take 1 tablet by mouth three times a day 90 tablet 5  . ibuprofen (  ADVIL,MOTRIN) 800 MG tablet     . losartan-hydrochlorothiazide (HYZAAR) 50-12.5 MG tablet take 1 tablet by mouth once daily 30 tablet 5  . predniSONE (DELTASONE) 5 MG tablet Take 20 mg by mouth daily.   0  . VENTOLIN HFA 108 (90 Base) MCG/ACT inhaler inhale 2 puffs by mouth every 6 hours if needed for wheezing 18 g 8  . fentaNYL (DURAGESIC) 75 MCG/HR Place 1 patch (75 mcg total) onto the skin every 3 (three) days. 10 patch 0  . oxyCODONE-acetaminophen (PERCOCET) 10-325 MG tablet Take 2 tablets by mouth every 4 (four) hours as needed for pain. 150 tablet 0  . hydrocortisone 2.5 % ointment APPLY ONE TO TWO TIMES DAILY FOR 14 DAYS  0  . triamcinolone (KENALOG) 0.025 % ointment Apply 1 application topically daily.     No facility-administered medications prior to visit.     No Known Allergies  Review of Systems  Constitutional: Positive for malaise/fatigue. Negative for  fever.  HENT: Negative for congestion.   Eyes: Negative for blurred vision.  Respiratory: Negative for cough and shortness of breath.   Cardiovascular: Negative for chest pain, palpitations and leg swelling.  Gastrointestinal: Negative for vomiting.  Musculoskeletal: Positive for back pain, joint pain and myalgias.  Skin: Positive for itching and rash.  Neurological: Negative for loss of consciousness and headaches.       Objective:    Physical Exam  Constitutional: She is oriented to person, place, and time. She appears well-developed and well-nourished. No distress.  HENT:  Head: Normocephalic and atraumatic.  Eyes: Conjunctivae are normal.  Neck: Normal range of motion. No thyromegaly present.  Cardiovascular: Normal rate and regular rhythm.   Pulmonary/Chest: Effort normal and breath sounds normal. She has no wheezes.  Abdominal: Soft. Bowel sounds are normal. There is no tenderness.  Musculoskeletal: Normal range of motion. She exhibits no edema or deformity.  Contracture third right finger  Neurological: She is alert and oriented to person, place, and time.  Skin: Skin is warm and dry. She is not diaphoretic.  Psychiatric: She has a normal mood and affect.    BP 128/82 (BP Location: Left Arm, Patient Position: Sitting, Cuff Size: Large)   Pulse 66   Temp 98.6 F (37 C) (Oral)   Resp 18   Wt 244 lb 9.6 oz (110.9 kg)   SpO2 96%   BMI 33.64 kg/m  Wt Readings from Last 3 Encounters:  11/20/16 244 lb 9.6 oz (110.9 kg)  10/23/16 250 lb 4 oz (113.5 kg)  07/07/16 233 lb 12.8 oz (106.1 kg)   BP Readings from Last 3 Encounters:  11/20/16 128/82  10/23/16 140/90  07/07/16 135/74     There is no immunization history for the selected administration types on file for this patient.  Health Maintenance  Topic Date Due  . Hepatitis C Screening  04-Aug-1960  . HIV Screening  09/17/1974  . PAP SMEAR  05/22/2008  . COLONOSCOPY  09/16/2009  . INFLUENZA VACCINE  12/20/2016    . MAMMOGRAM  06/30/2017  . TETANUS/TDAP  08/08/2024    Lab Results  Component Value Date   WBC 9.2 06/06/2016   HGB 14.1 06/06/2016   HCT 41.1 06/06/2016   PLT 269.0 06/06/2016   GLUCOSE 114 (H) 06/06/2016   CHOL 182 06/06/2016   TRIG 80.0 06/06/2016   HDL 77.50 06/06/2016   LDLCALC 89 06/06/2016   ALT 24 06/06/2016   AST 19 06/06/2016   NA 137 06/06/2016   K 3.8  06/06/2016   CL 100 06/06/2016   CREATININE 0.59 06/06/2016   BUN 12 06/06/2016   CO2 31 06/06/2016   TSH 2.30 06/06/2016   HGBA1C 5.8 06/06/2016    Lab Results  Component Value Date   TSH 2.30 06/06/2016   Lab Results  Component Value Date   WBC 9.2 06/06/2016   HGB 14.1 06/06/2016   HCT 41.1 06/06/2016   MCV 87.1 06/06/2016   PLT 269.0 06/06/2016   Lab Results  Component Value Date   NA 137 06/06/2016   K 3.8 06/06/2016   CO2 31 06/06/2016   GLUCOSE 114 (H) 06/06/2016   BUN 12 06/06/2016   CREATININE 0.59 06/06/2016   BILITOT 0.8 06/06/2016   ALKPHOS 91 06/06/2016   AST 19 06/06/2016   ALT 24 06/06/2016   PROT 7.6 06/06/2016   ALBUMIN 3.7 06/06/2016   CALCIUM 9.2 06/06/2016   ANIONGAP 8 11/26/2014   GFR 111.77 06/06/2016   Lab Results  Component Value Date   CHOL 182 06/06/2016   Lab Results  Component Value Date   HDL 77.50 06/06/2016   Lab Results  Component Value Date   LDLCALC 89 06/06/2016   Lab Results  Component Value Date   TRIG 80.0 06/06/2016   Lab Results  Component Value Date   CHOLHDL 2 06/06/2016   Lab Results  Component Value Date   HGBA1C 5.8 06/06/2016         Assessment & Plan:   Problem List Items Addressed This Visit    Obesity    Encouraged DASH diet, decrease po intake and increase exercise as tolerated. Needs 7-8 hours of sleep nightly. Avoid trans fats, eat small, frequent meals every 4-5 hours with lean proteins, complex carbs and healthy fats. Minimize simple carbs      TOBACCO ABUSE    Has not had a cigarette since 02/15/2017       Rheumatoid arthritis ([place])    They tried a new RA med, Orencia and she had a diffuse rash which became secondarily infected with staph but this has all resolved. She is not currently on any med for this and is in a great deal of pain. Will allow her to continue Fentanyl and Oxycodone for now      Relevant Medications   oxyCODONE-acetaminophen (PERCOCET) 10-325 MG tablet   fentaNYL (DURAGESIC) 75 MCG/HR   LOW BACK PAIN    Continues to struggle with daily pain. Gets relief with current meds will continue. Encouraged moist heat and gentle stretching as tolerated. May try NSAIDs and prescription meds as directed and report if symptoms worsen or seek immediate care      Relevant Medications   oxyCODONE-acetaminophen (PERCOCET) 10-325 MG tablet   fentaNYL (DURAGESIC) 75 MCG/HR   Hypertension    Well controlled, no changes to meds. Encouraged heart healthy diet such as the DASH diet and exercise as tolerated.       Pain   Relevant Medications   oxyCODONE-acetaminophen (PERCOCET) 10-325 MG tablet   Osteoporosis    Steroid induced. Check Vitamin D encouraged increased upper and lower body exercise and discussed bisphosphanates but she is apprehensive for now.          I have discontinued Ms. Pasquarella's hydrocortisone and triamcinolone. I have also changed her oxyCODONE-acetaminophen and fentaNYL. Additionally, I am having her maintain her ibuprofen, predniSONE, losartan-hydrochlorothiazide, VENTOLIN HFA, CARTIA XT, citalopram, and gabapentin.  Meds ordered this encounter  Medications  . oxyCODONE-acetaminophen (PERCOCET) 10-325 MG tablet    Sig:  Take 2 tablets by mouth every 4 (four) hours as needed for pain. Can fill on or after 12/07/2016    Dispense:  150 tablet    Refill:  0  . fentaNYL (DURAGESIC) 75 MCG/HR    Sig: Place 1 patch (75 mcg total) onto the skin every 3 (three) days. OK to fill on or after 12/03/2016    Dispense:  10 patch    Refill:  0    CMA served as scribe during  this visit. History, Physical and Plan performed by medical provider. Documentation and orders reviewed and attested to.  Penni Homans, MD

## 2016-11-21 NOTE — Assessment & Plan Note (Signed)
Steroid induced. Check Vitamin D encouraged increased upper and lower body exercise and discussed bisphosphanates but she is apprehensive for now.

## 2016-11-21 NOTE — Assessment & Plan Note (Signed)
They tried a new RA med, Orencia and she had a diffuse rash which became secondarily infected with staph but this has all resolved. She is not currently on any med for this and is in a great deal of pain. Will allow her to continue Fentanyl and Oxycodone for now

## 2016-11-21 NOTE — Assessment & Plan Note (Signed)
Well controlled, no changes to meds. Encouraged heart healthy diet such as the DASH diet and exercise as tolerated.  °

## 2016-11-21 NOTE — Assessment & Plan Note (Signed)
Encouraged DASH diet, decrease po intake and increase exercise as tolerated. Needs 7-8 hours of sleep nightly. Avoid trans fats, eat small, frequent meals every 4-5 hours with lean proteins, complex carbs and healthy fats. Minimize simple carbs 

## 2016-11-21 NOTE — Assessment & Plan Note (Signed)
Has not had a cigarette since 02/15/2017

## 2016-11-21 NOTE — Assessment & Plan Note (Signed)
Continues to struggle with daily pain. Gets relief with current meds will continue. Encouraged moist heat and gentle stretching as tolerated. May try NSAIDs and prescription meds as directed and report if symptoms worsen or seek immediate care

## 2016-12-01 ENCOUNTER — Telehealth: Payer: Self-pay | Admitting: Family Medicine

## 2016-12-01 NOTE — Telephone Encounter (Signed)
I am OK with that

## 2016-12-01 NOTE — Telephone Encounter (Signed)
Pt called in to make provider are that she is going out of town on vacation and to be expecting a call from the pharmacies to [facility] refill's on n medications. Pt says that she already have the hard copies (provided by Dr. Jacinto Reap)

## 2016-12-04 ENCOUNTER — Other Ambulatory Visit: Payer: Self-pay | Admitting: Family Medicine

## 2016-12-04 NOTE — Telephone Encounter (Signed)
[place] called in to validate. Informed that per PCP it is okay. I provided info needed to assist.   No call back needed.

## 2016-12-26 ENCOUNTER — Other Ambulatory Visit: Payer: Self-pay

## 2016-12-26 ENCOUNTER — Other Ambulatory Visit (INDEPENDENT_AMBULATORY_CARE_PROVIDER_SITE_OTHER): Payer: 59

## 2016-12-26 DIAGNOSIS — I10 Essential (primary) hypertension: Secondary | ICD-10-CM

## 2016-12-26 DIAGNOSIS — Z0000 Encounter for general adult medical examination without abnormal findings: Secondary | ICD-10-CM

## 2016-12-26 DIAGNOSIS — R739 Hyperglycemia, unspecified: Secondary | ICD-10-CM | POA: Diagnosis not present

## 2016-12-26 LAB — CBC
HCT: 42 % (ref 36.0–46.0)
HEMOGLOBIN: 14 g/dL (ref 12.0–15.0)
MCHC: 33.4 g/dL (ref 30.0–36.0)
MCV: 91.5 fl (ref 78.0–100.0)
PLATELETS: 255 10*3/uL (ref 150.0–400.0)
RBC: 4.59 Mil/uL (ref 3.87–5.11)
RDW: 13.7 % (ref 11.5–15.5)
WBC: 9.9 10*3/uL (ref 4.0–10.5)

## 2016-12-26 LAB — COMPREHENSIVE METABOLIC PANEL
ALK PHOS: 76 U/L (ref 39–117)
ALT: 22 U/L (ref 0–35)
AST: 19 U/L (ref 0–37)
Albumin: 3.9 g/dL (ref 3.5–5.2)
BILIRUBIN TOTAL: 0.9 mg/dL (ref 0.2–1.2)
BUN: 14 mg/dL (ref 6–23)
CO2: 31 meq/L (ref 19–32)
CREATININE: 0.74 mg/dL (ref 0.40–1.20)
Calcium: 9.4 mg/dL (ref 8.4–10.5)
Chloride: 99 mEq/L (ref 96–112)
GFR: 85.89 mL/min (ref >60.00)
GLUCOSE: 122 mg/dL — AB (ref 70–99)
Potassium: 4.2 mEq/L (ref 3.5–5.1)
Sodium: 138 mEq/L (ref 135–145)
TOTAL PROTEIN: 7.5 g/dL (ref 6.0–8.3)

## 2016-12-26 LAB — LIPID PANEL
CHOL/HDL RATIO: 2
Cholesterol: 180 mg/dL (ref 0–200)
HDL: 93.2 mg/dL (ref >39.00)
LDL Cholesterol: 71 mg/dL (ref 0–99)
NonHDL: 86.81
Triglycerides: 80 mg/dL (ref 0.0–149.0)
VLDL: 16 mg/dL (ref 0.0–40.0)

## 2016-12-26 LAB — HEMOGLOBIN A1C: HEMOGLOBIN A1C: 5.9 % (ref 4.6–6.5)

## 2016-12-26 LAB — TSH: TSH: 1.82 u[IU]/mL (ref 0.35–4.50)

## 2017-01-02 ENCOUNTER — Encounter: Payer: Self-pay | Admitting: Family Medicine

## 2017-01-02 ENCOUNTER — Ambulatory Visit (INDEPENDENT_AMBULATORY_CARE_PROVIDER_SITE_OTHER): Payer: 59 | Admitting: Family Medicine

## 2017-01-02 VITALS — BP 140/84 | HR 68 | Temp 98.1°F | Ht 71.0 in | Wt 246.0 lb

## 2017-01-02 DIAGNOSIS — Z0000 Encounter for general adult medical examination without abnormal findings: Secondary | ICD-10-CM

## 2017-01-02 DIAGNOSIS — M8580 Other specified disorders of bone density and structure, unspecified site: Secondary | ICD-10-CM

## 2017-01-02 DIAGNOSIS — R739 Hyperglycemia, unspecified: Secondary | ICD-10-CM

## 2017-01-02 DIAGNOSIS — E669 Obesity, unspecified: Secondary | ICD-10-CM

## 2017-01-02 DIAGNOSIS — I10 Essential (primary) hypertension: Secondary | ICD-10-CM | POA: Diagnosis not present

## 2017-01-02 DIAGNOSIS — R52 Pain, unspecified: Secondary | ICD-10-CM | POA: Diagnosis not present

## 2017-01-02 DIAGNOSIS — Z87891 Personal history of nicotine dependence: Secondary | ICD-10-CM

## 2017-01-02 DIAGNOSIS — M069 Rheumatoid arthritis, unspecified: Secondary | ICD-10-CM

## 2017-01-02 HISTORY — DX: Other specified disorders of bone density and structure, unspecified site: M85.80

## 2017-01-02 MED ORDER — VENLAFAXINE HCL ER 37.5 MG PO CP24: 37.5000 mg | ORAL_CAPSULE | Freq: Every day | ORAL | 0 refills | Status: DC

## 2017-01-02 MED ORDER — OXYCODONE-ACETAMINOPHEN 10-325 MG PO TABS: 2.0000 | ORAL_TABLET | ORAL | 0 refills | Status: DC | PRN

## 2017-01-02 MED ORDER — VENLAFAXINE HCL ER 75 MG PO CP24
75.0000 mg | ORAL_CAPSULE | Freq: Every day | ORAL | 2 refills | Status: DC
Start: 2017-01-02 — End: 2017-03-02

## 2017-01-02 MED ORDER — FENTANYL 75 MCG/HR TD PT72: 75.0000 ug | MEDICATED_PATCH | TRANSDERMAL | 0 refills | Status: DC

## 2017-01-02 NOTE — Assessment & Plan Note (Signed)
Has had to use steroids a lot lately, did not tolerate Symponi. Has been taking Prednisone 5 mg daily, was on 15 mg daily for a while. Olumiant 2 mg daily just approved and is prescribed by rheumatology started it last week without side effects but no improvement

## 2017-01-02 NOTE — Assessment & Plan Note (Signed)
Well controlled, no changes to meds. Encouraged heart healthy diet such as the DASH diet and exercise as tolerated.  °

## 2017-01-02 NOTE — Progress Notes (Signed)
Subjective:  I acted as a Education administrator for Dr. Charlett Blake. [name], [place]  Patient ID: Jennifer Kelly, female    DOB: 09-25-1960, 56 y.o.   MRN: [id number]  Chief Complaint  Patient presents with  . Annual Exam    Patient is here today for annual exam.  On 9.27.18 she will be 1 year without cigarettes.     HPI  Patient is in today for an annual exam. Patient presently has no acute concerns. She is following up on her HTN, hyperglycemia and other medical concerns. No recent febrile illness or acute hospitalizations. Denies CP/palp/SOB/HA/congestion/fevers/GI or GU c/o. Taking meds as prescribed. Continues to work fulltime and tries to maintain a heart healthy diet. No polyuria or polydipsia. She struggles with chronic back pain and uses her meds as needed with some relief.    Patient Care Team: Mosie Lukes, MD as PCP - General (Family Medicine) Jovita Gamma, MD (Neurosurgery) Unice Bailey, MD (Rheumatology) Elsie Saas, MD (Orthopedic Surgery)   Past Medical History:  Diagnosis Date  . Acute bronchitis 01/05/2014  . Arthritis   . Chicken pox as a child  . Dermatitis 09/09/2014  . Ganglion cyst of left foot 09/09/2014  . H/O mumps   . H/O tobacco use, presenting hazards to health 10/08/2009   Qualifier: Diagnosis of  By: Linda Hedges MD, [employer] Knuckles  Last cigarette 02/16/2016   . History of chicken pox   . Hyperglycemia 11/30/2015  . Hypertension   . Mumps as a child  . Osteopenia 01/02/2017  . Osteoporosis 11/20/2016  . Psoriatic arthritis ([place])   . Rheumatoid arthritis ([place])   . Shingles 2014    Past Surgical History:  Procedure Laterality Date  . BACK SURGERY  2003  . NECK SURGERY  02/17/2016   C4, C5 & C6  . rotater cuff  20 yrs ago   left shoulder  . TOOTH EXTRACTION  05/2012  . WISDOM TOOTH EXTRACTION  56 yrs old    Family History  Problem Relation Age of Onset  . Parkinson's disease Mother   . Heart disease Mother   . Hypertension Mother   . Diabetes Father    type 2  . Hypertension Father   . Stroke Father   . Vascular Disease Brother   . Hypertension Brother   . Diabetes Brother   . Heart attack Paternal Uncle     Social History   Social History  . Marital status: Divorced    Spouse name: N/A  . Number of children: 0  . Years of education: N/A   Occupational History  . help desk coordinator    Social History Main Topics  . Smoking status: Former Smoker    Start date: 11/30/2015  . Smokeless tobacco: Never Used  . Alcohol use Yes     Comment: occ wine  . Drug use: No  . Sexual activity: No     Comment: no dietary restrictions. lives alone   Other Topics Concern  . Not on file   Social History Narrative  . No narrative on file    Outpatient Medications Prior to Visit  Medication Sig Dispense Refill  . CARTIA XT 240 MG 24 hr capsule take 1 capsule by mouth once daily 30 capsule 5  . gabapentin (NEURONTIN) 600 MG tablet take 1 tablet by mouth three times a day 90 tablet 5  . ibuprofen (ADVIL,MOTRIN) 800 MG tablet     . losartan-hydrochlorothiazide (HYZAAR) 50-12.5 MG tablet take 1 tablet by mouth once  daily 30 tablet 5  . predniSONE (DELTASONE) 5 MG tablet Take 20 mg by mouth daily.   0  . VENTOLIN HFA 108 (90 Base) MCG/ACT inhaler inhale 2 puffs by mouth every 6 hours if needed for wheezing 18 g 8  . citalopram (CELEXA) 20 MG tablet take 1 tablet by mouth once daily 30 tablet 11  . fentaNYL (DURAGESIC) 75 MCG/HR Place 1 patch (75 mcg total) onto the skin every 3 (three) days. OK to fill on or after 12/03/2016 10 patch 0  . oxyCODONE-acetaminophen (PERCOCET) 10-325 MG tablet Take 2 tablets by mouth every 4 (four) hours as needed for pain. Can fill on or after 12/07/2016 150 tablet 0   No facility-administered medications prior to visit.     Allergies  Allergen Reactions  . Simponi [Golimumab] Rash    States that it was systemic    Review of Systems  Constitutional: Negative for fever and malaise/fatigue.  HENT:  Negative for congestion.   Eyes: Negative for blurred vision.  Respiratory: Negative for cough and shortness of breath.   Cardiovascular: Negative for chest pain, palpitations and leg swelling.  Gastrointestinal: Negative for vomiting.  Musculoskeletal: Negative for back pain.  Skin: Negative for rash.  Neurological: Negative for loss of consciousness and headaches.       Objective:    Physical Exam  Constitutional: She is oriented to person, place, and time. She appears well-developed and well-nourished. No distress.  HENT:  Head: Normocephalic and atraumatic.  Eyes: Conjunctivae are normal.  Neck: Normal range of motion. No thyromegaly present.  Cardiovascular: Normal rate and regular rhythm.   Pulmonary/Chest: Effort normal and breath sounds normal. She has no wheezes.  Abdominal: Soft. Bowel sounds are normal. There is no tenderness.  Musculoskeletal: Normal range of motion. She exhibits no edema or deformity.  Neurological: She is alert and oriented to person, place, and time.  Skin: Skin is warm and dry. She is not diaphoretic.  Psychiatric: She has a normal mood and affect.    BP 140/84 (BP Location: Right Arm, Patient Position: Sitting, Cuff Size: Large)   Pulse 68   Temp 98.1 F (36.7 C) (Oral)   Ht 5\' 11"  (1.803 m)   Wt 246 lb (111.6 kg)   SpO2 97%   BMI 34.31 kg/m  Wt Readings from Last 3 Encounters:  01/02/17 246 lb (111.6 kg)  11/20/16 244 lb 9.6 oz (110.9 kg)  10/23/16 250 lb 4 oz (113.5 kg)   BP Readings from Last 3 Encounters:  01/02/17 140/84  11/20/16 128/82  10/23/16 140/90     There is no immunization history for the selected administration types on file for this patient.  Health Maintenance  Topic Date Due  . Hepatitis C Screening  1961-01-29  . HIV Screening  09/17/1974  . PAP SMEAR  05/22/2008  . COLONOSCOPY  09/16/2009  . INFLUENZA VACCINE  12/20/2016  . MAMMOGRAM  06/30/2017  . TETANUS/TDAP  08/08/2024    Lab Results  Component  Value Date   WBC 9.9 12/26/2016   HGB 14.0 12/26/2016   HCT 42.0 12/26/2016   PLT 255.0 12/26/2016   GLUCOSE 122 (H) 12/26/2016   CHOL 180 12/26/2016   TRIG 80.0 12/26/2016   HDL 93.20 12/26/2016   LDLCALC 71 12/26/2016   ALT 22 12/26/2016   AST 19 12/26/2016   NA 138 12/26/2016   K 4.2 12/26/2016   CL 99 12/26/2016   CREATININE 0.74 12/26/2016   BUN 14 12/26/2016   CO2  31 12/26/2016   TSH 1.82 12/26/2016   HGBA1C 5.9 12/26/2016    Lab Results  Component Value Date   TSH 1.82 12/26/2016   Lab Results  Component Value Date   WBC 9.9 12/26/2016   HGB 14.0 12/26/2016   HCT 42.0 12/26/2016   MCV 91.5 12/26/2016   PLT 255.0 12/26/2016   Lab Results  Component Value Date   NA 138 12/26/2016   K 4.2 12/26/2016   CO2 31 12/26/2016   GLUCOSE 122 (H) 12/26/2016   BUN 14 12/26/2016   CREATININE 0.74 12/26/2016   BILITOT 0.9 12/26/2016   ALKPHOS 76 12/26/2016   AST 19 12/26/2016   ALT 22 12/26/2016   PROT 7.5 12/26/2016   ALBUMIN 3.9 12/26/2016   CALCIUM 9.4 12/26/2016   ANIONGAP 8 11/26/2014   GFR 85.89 12/26/2016   Lab Results  Component Value Date   CHOL 180 12/26/2016   Lab Results  Component Value Date   HDL 93.20 12/26/2016   Lab Results  Component Value Date   LDLCALC 71 12/26/2016   Lab Results  Component Value Date   TRIG 80.0 12/26/2016   Lab Results  Component Value Date   CHOLHDL 2 12/26/2016   Lab Results  Component Value Date   HGBA1C 5.9 12/26/2016         Assessment & Plan:   Problem List Items Addressed This Visit    Obesity    Encouraged DASH diet, decrease po intake and increase exercise as tolerated. Needs 7-8 hours of sleep nightly. Avoid trans fats, eat small, frequent meals every 4-5 hours with lean proteins, complex carbs and healthy fats. Minimize simple carbs, bariatric referral placed      H/O tobacco use, presenting hazards to health   Rheumatoid arthritis ([place])    Has had to use steroids a lot lately, did not  tolerate Symponi. Has been taking Prednisone 5 mg daily, was on 15 mg daily for a while. Olumiant 2 mg daily just approved and is prescribed by rheumatology started it last week without side effects but no improvement      Relevant Medications   Baricitinib (OLUMIANT) 2 MG TABS   oxyCODONE-acetaminophen (PERCOCET) 10-325 MG tablet   fentaNYL (DURAGESIC) 75 MCG/HR   Routine health maintenance    Patient encouraged to maintain heart healthy diet, regular exercise, adequate sleep. Consider daily probiotics. Take medications as prescribed. Labs reviewed      Hypertension    Well controlled, no changes to meds. Encouraged heart healthy diet such as the DASH diet and exercise as tolerated.       Pain    Struggles with daily chronic pain due to osteoarthritis, degenerative changes in the back and rheumatoid arthritis.       Relevant Medications   oxyCODONE-acetaminophen (PERCOCET) 10-325 MG tablet   Hyperglycemia    hgba1c acceptable, minimize simple carbs. Increase exercise as tolerated. Continue current meds      Osteopenia    Encouraged to get adequate exercise, calcium and vitamin d intake         I have discontinued Ms. Behnke's citalopram. I have also changed her oxyCODONE-acetaminophen. Additionally, I am having her start on venlafaxine XR and venlafaxine XR. Lastly, I am having her maintain her ibuprofen, predniSONE, VENTOLIN HFA, CARTIA XT, gabapentin, losartan-hydrochlorothiazide, Baricitinib, and fentaNYL.  Meds ordered this encounter  Medications  . Baricitinib (OLUMIANT) 2 MG TABS    Sig: Take 2 mg by mouth daily.  Marland Kitchen venlafaxine XR (EFFEXOR XR) 37.5 MG  24 hr capsule    Sig: Take 1 capsule (37.5 mg total) by mouth daily with breakfast.    Dispense:  7 capsule    Refill:  0  . venlafaxine XR (EFFEXOR XR) 75 MG 24 hr capsule    Sig: Take 1 capsule (75 mg total) by mouth daily with breakfast.    Dispense:  30 capsule    Refill:  2  . oxyCODONE-acetaminophen (PERCOCET)  10-325 MG tablet    Sig: Take 2 tablets by mouth every 4 (four) hours as needed for pain. Can fill on or after 01/05/2017    Dispense:  150 tablet    Refill:  0  . fentaNYL (DURAGESIC) 75 MCG/HR    Sig: Place 1 patch (75 mcg total) onto the skin every 3 (three) days. OK to fill on or after 12/03/2016    Dispense:  10 patch    Refill:  0    CMA served as scribe during this visit. History, Physical and Plan performed by medical provider. Documentation and orders reviewed and attested to.  Penni Homans, MD

## 2017-01-02 NOTE — Assessment & Plan Note (Signed)
hgba1c acceptable, minimize simple carbs. Increase exercise as tolerated. Continue current meds 

## 2017-01-02 NOTE — Patient Instructions (Addendum)
Take the 1/2 tab of the Citalopram 20 mg for a week then stop. Take the Venlafaxine XR 37.5 daily x the first week then increase to 75 mg daily  Shingrix new shingles shot 2 shots over 6 moonths. See if Rheumatology Preventive Care 40-64 Years, Female Preventive care refers to lifestyle choices and visits with your health care provider that can promote health and wellness. What does preventive care include?  A yearly physical exam. This is also called an annual well check.  Dental exams once or twice a year.  Routine eye exams. Ask your health care provider how often you should have your eyes checked.  Personal lifestyle choices, including: ? Daily care of your teeth and gums. ? Regular physical activity. ? Eating a healthy diet. ? Avoiding tobacco and drug use. ? Limiting alcohol use. ? Practicing safe sex. ? Taking low-dose aspirin daily starting at age 31. ? Taking vitamin and mineral supplements as recommended by your health care provider. What happens during an annual well check? The services and screenings done by your health care provider during your annual well check will depend on your age, overall health, lifestyle risk factors, and family history of disease. Counseling Your health care provider may ask you questions about your:  Alcohol use.  Tobacco use.  Drug use.  Emotional well-being.  Home and relationship well-being.  Sexual activity.  Eating habits.  Work and work Statistician.  Method of birth control.  Menstrual cycle.  Pregnancy history.  Screening You may have the following tests or measurements:  Height, weight, and BMI.  Blood pressure.  Lipid and cholesterol levels. These may be checked every 5 years, or more frequently if you are over 52 years old.  Skin check.  Lung cancer screening. You may have this screening every year starting at age 20 if you have a 30-pack-year history of smoking and currently smoke or have quit within the  past 15 years.  Fecal occult blood test (FOBT) of the stool. You may have this test every year starting at age 55.  Flexible sigmoidoscopy or colonoscopy. You may have a sigmoidoscopy every 5 years or a colonoscopy every 10 years starting at age 93.  Hepatitis C blood test.  Hepatitis B blood test.  Sexually transmitted disease (STD) testing.  Diabetes screening. This is done by checking your blood sugar (glucose) after you have not eaten for a while (fasting). You may have this done every 1-3 years.  Mammogram. This may be done every 1-2 years. Talk to your health care provider about when you should start having regular mammograms. This may depend on whether you have a family history of breast cancer.  BRCA-related cancer screening. This may be done if you have a family history of breast, ovarian, tubal, or peritoneal cancers.  Pelvic exam and Pap test. This may be done every 3 years starting at age 88. Starting at age 24, this may be done every 5 years if you have a Pap test in combination with an HPV test.  Bone density scan. This is done to screen for osteoporosis. You may have this scan if you are at high risk for osteoporosis.  Discuss your test results, treatment options, and if necessary, the need for more tests with your health care provider. Vaccines Your health care provider may recommend certain vaccines, such as:  Influenza vaccine. This is recommended every year.  Tetanus, diphtheria, and acellular pertussis (Tdap, Td) vaccine. You may need a Td booster every 10 years.  Varicella vaccine. You may need this if you have not been vaccinated.  Zoster vaccine. You may need this after age 64.  Measles, mumps, and rubella (MMR) vaccine. You may need at least one dose of MMR if you were born in 1957 or later. You may also need a second dose.  Pneumococcal 13-valent conjugate (PCV13) vaccine. You may need this if you have certain conditions and were not previously  vaccinated.  Pneumococcal polysaccharide (PPSV23) vaccine. You may need one or two doses if you smoke cigarettes or if you have certain conditions.  Meningococcal vaccine. You may need this if you have certain conditions.  Hepatitis A vaccine. You may need this if you have certain conditions or if you travel or work in places where you may be exposed to hepatitis A.  Hepatitis B vaccine. You may need this if you have certain conditions or if you travel or work in places where you may be exposed to hepatitis B.  Haemophilus influenzae type b (Hib) vaccine. You may need this if you have certain conditions.  Talk to your health care provider about which screenings and vaccines you need and how often you need them. This information is not intended to replace advice given to you by your health care provider. Make sure you discuss any questions you have with your health care provider. Document Released: 06/04/2015 Document Revised: 01/26/2016 Document Reviewed: 03/09/2015 Elsevier Interactive Patient Education  2017 [employer].

## 2017-01-02 NOTE — Assessment & Plan Note (Signed)
Encouraged DASH diet, decrease po intake and increase exercise as tolerated. Needs 7-8 hours of sleep nightly. Avoid trans fats, eat small, frequent meals every 4-5 hours with lean proteins, complex carbs and healthy fats. Minimize simple carbs, bariatric referral placed 

## 2017-01-04 NOTE — Assessment & Plan Note (Signed)
Encouraged to get adequate exercise, calcium and vitamin d intake 

## 2017-01-04 NOTE — Assessment & Plan Note (Addendum)
Struggles with daily chronic pain due to osteoarthritis, degenerative changes in the back and rheumatoid arthritis.

## 2017-01-07 NOTE — Assessment & Plan Note (Signed)
Patient encouraged to maintain heart healthy diet, regular exercise, adequate sleep. Consider daily probiotics. Take medications as prescribed. Labs reviewed 

## 2017-01-09 ENCOUNTER — Other Ambulatory Visit: Payer: Self-pay | Admitting: Family Medicine

## 2017-01-09 NOTE — Telephone Encounter (Signed)
Self   Refill for VENTOLIN [facility]    Pharmacy: [facility] phone: [phone] - [address], [place], [place]

## 2017-01-09 NOTE — Telephone Encounter (Signed)
Relation to pt: self  Call back number: [phone] Pharmacy: [facility] [phone] [place] [phone]   Reason for call:  Patient states she contacted pharmacy Friday 01/05/17, patient completely out, please advise

## 2017-01-10 MED ORDER — ALBUTEROL SULFATE HFA 108 (90 BASE) MCG/ACT IN AERS: INHALATION_SPRAY | RESPIRATORY_TRACT | 8 refills | Status: DC

## 2017-01-10 NOTE — Telephone Encounter (Signed)
Refill sent in

## 2017-01-17 ENCOUNTER — Telehealth: Payer: Self-pay | Admitting: Family Medicine

## 2017-01-17 NOTE — Telephone Encounter (Signed)
[facility] - Client [place] Patient Name: Jennifer Kelly DOB: 1960/10/26 Initial Comment Caller states saw MD couple of weeks ago for physical; having leg cramps, day and night; eating bananas; has RA, 2 meds changed w/i past month; Nurse Assessment Nurse: Markus Daft, RN, [place] Date/Time (Eastern Time): 01/17/2017 12:17:05 PM Confirm and document reason for call. If symptomatic, describe symptoms. ---Caller states she has been having leg cramps during the day and night. Last night worse with having to get up and walk. Thought MD was going to order something when discussed at last visit. She is on a new med - Olumiant for h/o RA. She is not on [place] any longer. She is eating bananas. Also she was weaned off Celexa and she is on Effexor at full dosage since this past weekend. Does the patient have any new or worsening symptoms? ---No Please document clinical information provided and list any resource used. ---RN will message MD to see about response. Caller verbalized understanding. Guidelines Guideline Title Affirmed Question Affirmed Notes Final Disposition User Clinical Call [place], RN, Sherre Poot Comments What can she do OTC or can something be called in? Any diet, or meds, whatever to help. [place]? Potassium?

## 2017-01-31 ENCOUNTER — Other Ambulatory Visit: Payer: Self-pay | Admitting: Family Medicine

## 2017-01-31 DIAGNOSIS — R52 Pain, unspecified: Secondary | ICD-10-CM

## 2017-01-31 DIAGNOSIS — M069 Rheumatoid arthritis, unspecified: Secondary | ICD-10-CM

## 2017-01-31 NOTE — Telephone Encounter (Signed)
Relation to BE:MLJQ Call back number:[phone]   Reason for call:  Patient requesting a refill oxyCODONE-acetaminophen (PERCOCET) 10-325 MG tablet and fentaNYL (DURAGESIC) 75 MCG/HR

## 2017-02-01 ENCOUNTER — Other Ambulatory Visit: Payer: Self-pay

## 2017-02-01 MED ORDER — OXYCODONE-ACETAMINOPHEN 10-325 MG PO TABS: 2.0000 | ORAL_TABLET | ORAL | 0 refills | Status: DC | PRN

## 2017-02-01 MED ORDER — FENTANYL 75 MCG/HR TD PT72: 75.0000 ug | MEDICATED_PATCH | TRANSDERMAL | 0 refills | Status: DC

## 2017-02-01 NOTE — Telephone Encounter (Signed)
Requesting:Oxycodone Contract:Yes UDS:NO Last OV:01/02/17 Next OV:04/05/17 Last Refill:01/02/17  #[id number]   Please advise

## 2017-02-01 NOTE — Telephone Encounter (Signed)
Left message for patient to pick med up in the front ofc.    PC

## 2017-02-28 ENCOUNTER — Ambulatory Visit (INDEPENDENT_AMBULATORY_CARE_PROVIDER_SITE_OTHER): Payer: 59 | Admitting: Psychology

## 2017-02-28 DIAGNOSIS — F4323 Adjustment disorder with mixed anxiety and depressed mood: Secondary | ICD-10-CM | POA: Diagnosis not present

## 2017-03-01 ENCOUNTER — Telehealth: Payer: Self-pay | Admitting: Family Medicine

## 2017-03-01 DIAGNOSIS — M069 Rheumatoid arthritis, unspecified: Secondary | ICD-10-CM

## 2017-03-01 DIAGNOSIS — R52 Pain, unspecified: Secondary | ICD-10-CM

## 2017-03-01 NOTE — Telephone Encounter (Signed)
Relation to pt: self  Call back number: [phone]    Reason for call:  Patient requesting a refill oxyCODONE-acetaminophen (PERCOCET) 10-325 MG tablet and fentaNYL (DURAGESIC) 75 MCG/HR and states medication will run out this weekend/ Monday.    Patient states since her prescription changed from citalopram (CELEXA) 20 MG tablet to  venlafaxine XR (EFFEXOR XR) 75 MG 24 hr capsule she has not been the same stating she's sad, depressed and crying. Patient unsure if effexor MG should be increased, please advise

## 2017-03-01 NOTE — Telephone Encounter (Signed)
Patient states since her prescription changed from citalopram (CELEXA) 20 MG tablet to venlafaxine XR (EFFEXOR XR) 75 MG 24 hr capsule she has not been the same stating she's sad, depressed and crying. Patient unsure if effexor MG should be increased, please advise       Also requesting  .Requesting: fentayl   &   Percocet Contract:yes UDS:11/20/16 moderate risk Last OV:01/02/17 Next OV:04/05/17 Last Refill:02/01/17 Fentanyl  #10 patches 0-rf Percocet #150-0rf   Please advise

## 2017-03-02 ENCOUNTER — Telehealth: Payer: Self-pay

## 2017-03-02 DIAGNOSIS — R52 Pain, unspecified: Secondary | ICD-10-CM

## 2017-03-02 DIAGNOSIS — M069 Rheumatoid arthritis, unspecified: Secondary | ICD-10-CM

## 2017-03-02 MED ORDER — OXYCODONE-ACETAMINOPHEN 10-325 MG PO TABS: 2.0000 | ORAL_TABLET | ORAL | 0 refills | Status: DC | PRN

## 2017-03-02 MED ORDER — CITALOPRAM HYDROBROMIDE 20 MG PO TABS: ORAL_TABLET | ORAL | 1 refills | Status: DC

## 2017-03-02 MED ORDER — VENLAFAXINE HCL ER 75 MG PO CP24: 75.0000 mg | ORAL_CAPSULE | Freq: Every day | ORAL | 2 refills | Status: DC

## 2017-03-02 MED ORDER — FENTANYL 75 MCG/HR TD PT72: 75.0000 ug | MEDICATED_PATCH | TRANSDERMAL | 0 refills | Status: DC

## 2017-03-02 NOTE — Telephone Encounter (Signed)
Patient notified  rx ready for pick up on the front ofc

## 2017-03-02 NOTE — Telephone Encounter (Signed)
Lets try staying at 75 of the Venlafaxine and adding back Citalopram 10 mg po daily disp #30 with 1 rf and have her come in in next couple of weeks to discuss.

## 2017-03-06 NOTE — Telephone Encounter (Signed)
Jennifer Kelly called back and said she is down to 2 patches and it also needs PA. Please advise

## 2017-03-06 NOTE — Telephone Encounter (Signed)
The PA process is going on at this moment.

## 2017-03-06 NOTE — Telephone Encounter (Signed)
Awaiting response from Dr. Charlett Blake regarding a PA question before I can submit via Covermymeds.

## 2017-03-06 NOTE — Telephone Encounter (Signed)
Jennifer Kelly has started the process of the PA.   I have spoke to the patient about what is going on. She has been made aware.

## 2017-03-08 NOTE — Telephone Encounter (Signed)
Request Reference Number: [id number]. FENTANYL DIS 75MCG/HR is approved through 09/04/2017. For further questions, call [phone].

## 2017-03-08 NOTE — Telephone Encounter (Signed)
PA initiated via Covermymeds; KEY: [id number]. Requesting urgent determination. Awaiting determination.

## 2017-03-08 NOTE — Telephone Encounter (Addendum)
Patient checking on the status of PA mentioned below, please advise [phone]

## 2017-03-19 ENCOUNTER — Ambulatory Visit (INDEPENDENT_AMBULATORY_CARE_PROVIDER_SITE_OTHER): Payer: 59 | Admitting: Psychology

## 2017-03-19 DIAGNOSIS — F4321 Adjustment disorder with depressed mood: Secondary | ICD-10-CM | POA: Diagnosis not present

## 2017-03-29 ENCOUNTER — Other Ambulatory Visit: Payer: Self-pay | Admitting: Family Medicine

## 2017-03-29 DIAGNOSIS — R52 Pain, unspecified: Secondary | ICD-10-CM

## 2017-03-29 DIAGNOSIS — M069 Rheumatoid arthritis, unspecified: Secondary | ICD-10-CM

## 2017-03-29 MED ORDER — FENTANYL 75 MCG/HR TD PT72: 75.0000 ug | MEDICATED_PATCH | TRANSDERMAL | 0 refills | Status: DC

## 2017-03-29 MED ORDER — OXYCODONE-ACETAMINOPHEN 10-325 MG PO TABS: 2.0000 | ORAL_TABLET | ORAL | 0 refills | Status: DC | PRN

## 2017-03-29 NOTE — Telephone Encounter (Signed)
Pt request refill Fentanyl patches and Percocet.

## 2017-03-29 NOTE — Telephone Encounter (Signed)
Requesting:Fentanyl  &  Percocet Contract:yes UDS:02/20/17 moderate risk  Last OV:12/23/16 Next OV:04/05/17 Last Refill:03/02/17 Fentanyl  #[id number] Percocet #150-0rf   Please advise

## 2017-03-30 MED ORDER — FENTANYL 75 MCG/HR TD PT72: 75.0000 ug | MEDICATED_PATCH | TRANSDERMAL | 0 refills | Status: DC

## 2017-03-30 MED ORDER — OXYCODONE-ACETAMINOPHEN 10-325 MG PO TABS: 2.0000 | ORAL_TABLET | ORAL | 0 refills | Status: DC | PRN

## 2017-03-30 NOTE — Telephone Encounter (Signed)
rx printed  Awaiting pcp sig

## 2017-03-30 NOTE — Telephone Encounter (Signed)
Rx reprinted for DOD to sign

## 2017-03-30 NOTE — Addendum Note (Signed)
Addended by: Magdalene Molly A on: 03/30/2017 02:41 PM   Modules accepted: Orders

## 2017-03-30 NOTE — Telephone Encounter (Addendum)
Relation to BF:XOVA Call back number:[phone] Pharmacy:   [facility] Nani Ravens  Reason for call:  Patient checking on the status of message below and states she will run out of percocet on Sunday and would like to know if covering [facility] and sign,please dvise

## 2017-04-05 ENCOUNTER — Ambulatory Visit: Payer: 59 | Admitting: Family Medicine

## 2017-04-19 ENCOUNTER — Ambulatory Visit (INDEPENDENT_AMBULATORY_CARE_PROVIDER_SITE_OTHER): Payer: 59 | Admitting: Psychology

## 2017-04-19 DIAGNOSIS — F4321 Adjustment disorder with depressed mood: Secondary | ICD-10-CM

## 2017-04-25 ENCOUNTER — Other Ambulatory Visit: Payer: Self-pay | Admitting: Family Medicine

## 2017-04-25 DIAGNOSIS — Z79899 Other long term (current) drug therapy: Secondary | ICD-10-CM

## 2017-04-25 DIAGNOSIS — R52 Pain, unspecified: Secondary | ICD-10-CM

## 2017-04-25 DIAGNOSIS — M069 Rheumatoid arthritis, unspecified: Secondary | ICD-10-CM

## 2017-04-25 NOTE — Telephone Encounter (Signed)
Copied from [place] [phone]. Topic: Quick Communication - See Telephone Encounter >> Apr 25, 2017  3:43 PM Synthia Innocent wrote: CRM for notification. See Telephone encounter for: Requesting refill on fentaNYL (DURAGESIC) 75 MCG/HR and  oxyCODONE-acetaminophen (PERCOCET) 10-325 MG tablet.  04/25/17.

## 2017-04-25 NOTE — Telephone Encounter (Signed)
Copied from [place] [id number]. Topic: Quick Communication - See Telephone Encounter >> Apr 25, 2017  3:43 PM Synthia Innocent wrote: CRM for notification. See Telephone encounter for: Requesting refill on fentaNYL (DURAGESIC) 75 MCG/HR and  oxyCODONE-acetaminophen (PERCOCET) 10-325 MG tablet.  04/25/17.

## 2017-04-26 ENCOUNTER — Other Ambulatory Visit: Payer: Self-pay | Admitting: Family Medicine

## 2017-04-26 NOTE — Telephone Encounter (Signed)
Oxycodone refill. Last OV 01/02/17. Last refilled on 03/30/17.

## 2017-04-27 ENCOUNTER — Other Ambulatory Visit: Payer: 59

## 2017-04-27 DIAGNOSIS — Z79899 Other long term (current) drug therapy: Secondary | ICD-10-CM

## 2017-04-27 MED ORDER — OXYCODONE-ACETAMINOPHEN 10-325 MG PO TABS: 2.0000 | ORAL_TABLET | ORAL | 0 refills | Status: DC | PRN

## 2017-04-27 MED ORDER — FENTANYL 75 MCG/HR TD PT72: 75.0000 ug | MEDICATED_PATCH | TRANSDERMAL | 0 refills | Status: DC

## 2017-04-27 NOTE — Telephone Encounter (Signed)
Patient notified

## 2017-04-27 NOTE — Telephone Encounter (Signed)
Requesting:Percocets  &     Fentanyl    Contract:yes UDS: moderate risk next screen 02/20/17 Last OV:01/02/17 Next OV:05/10/17 Last Refill: Percocet's 03/30/17 #150-0rf Fentanyl 03/30/17  #[id number] -[id number]  Please Advise

## 2017-04-27 NOTE — Addendum Note (Signed)
Addended by: Magdalene Molly A on: 04/27/2017 08:48 AM   Modules accepted: Orders

## 2017-04-27 NOTE — Telephone Encounter (Signed)
Needs UDS updated. Then refill

## 2017-04-30 ENCOUNTER — Other Ambulatory Visit: Payer: Self-pay | Admitting: Family Medicine

## 2017-05-03 LAB — PAIN MGMT, PROFILE 8 W/CONF, U
6 ACETYLMORPHINE: NEGATIVE ng/mL (ref <10)
ALPHAHYDROXYALPRAZOLAM: NEGATIVE ng/mL (ref <25)
ALPHAHYDROXYMIDAZOLAM: NEGATIVE ng/mL (ref <50)
ALPHAHYDROXYTRIAZOLAM: NEGATIVE ng/mL (ref <50)
AMPHETAMINES: NEGATIVE ng/mL (ref <500)
Alcohol Metabolites: POSITIVE ng/mL — AB (ref <500)
Aminoclonazepam: NEGATIVE ng/mL (ref <25)
BENZODIAZEPINES: NEGATIVE ng/mL (ref <100)
Buprenorphine, Urine: NEGATIVE ng/mL (ref <5)
CREATININE: 73 mg/dL
Cocaine Metabolite: NEGATIVE ng/mL (ref <150)
ETHYL GLUCURONIDE (ETG): 36806 ng/mL — AB (ref <500)
Ethyl Sulfate (ETS): 8736 ng/mL — ABNORMAL HIGH (ref <100)
Hydroxyethylflurazepam: NEGATIVE ng/mL (ref <50)
Lorazepam: NEGATIVE ng/mL (ref <50)
MDMA: NEGATIVE ng/mL (ref <500)
Marijuana Metabolite: NEGATIVE ng/mL (ref <20)
NORDIAZEPAM: NEGATIVE ng/mL (ref <50)
OPIATES: NEGATIVE ng/mL (ref <100)
OXAZEPAM: NEGATIVE ng/mL (ref <50)
OXIDANT: NEGATIVE ug/mL (ref <200)
Oxycodone: NEGATIVE ng/mL (ref <100)
PH: 7.5 (ref 4.5–9.0)
Temazepam: NEGATIVE ng/mL (ref <50)

## 2017-05-10 ENCOUNTER — Encounter: Payer: Self-pay | Admitting: Family Medicine

## 2017-05-10 ENCOUNTER — Ambulatory Visit (INDEPENDENT_AMBULATORY_CARE_PROVIDER_SITE_OTHER): Payer: 59 | Admitting: Family Medicine

## 2017-05-10 DIAGNOSIS — R739 Hyperglycemia, unspecified: Secondary | ICD-10-CM | POA: Diagnosis not present

## 2017-05-10 DIAGNOSIS — M05742 Rheumatoid arthritis with rheumatoid factor of left hand without organ or systems involvement: Secondary | ICD-10-CM | POA: Diagnosis not present

## 2017-05-10 DIAGNOSIS — M05741 Rheumatoid arthritis with rheumatoid factor of right hand without organ or systems involvement: Secondary | ICD-10-CM | POA: Diagnosis not present

## 2017-05-10 DIAGNOSIS — I10 Essential (primary) hypertension: Secondary | ICD-10-CM

## 2017-05-10 DIAGNOSIS — F418 Other specified anxiety disorders: Secondary | ICD-10-CM

## 2017-05-10 LAB — COMPREHENSIVE METABOLIC PANEL
ALBUMIN: 3.9 g/dL (ref 3.5–5.2)
ALK PHOS: 77 U/L (ref 39–117)
ALT: 22 U/L (ref 0–35)
AST: 20 U/L (ref 0–37)
BILIRUBIN TOTAL: 0.6 mg/dL (ref 0.2–1.2)
BUN: 17 mg/dL (ref 6–23)
CALCIUM: 8.8 mg/dL (ref 8.4–10.5)
CHLORIDE: 100 meq/L (ref 96–112)
CO2: 31 mEq/L (ref 19–32)
CREATININE: 0.7 mg/dL (ref 0.40–1.20)
GFR: 91.46 mL/min (ref >60.00)
Glucose, Bld: 124 mg/dL — ABNORMAL HIGH (ref 70–99)
Potassium: 4.2 mEq/L (ref 3.5–5.1)
Sodium: 137 mEq/L (ref 135–145)
Total Protein: 7.5 g/dL (ref 6.0–8.3)

## 2017-05-10 LAB — TSH: TSH: 0.78 u[IU]/mL (ref 0.35–4.50)

## 2017-05-10 LAB — LIPID PANEL
CHOLESTEROL: 171 mg/dL (ref 0–200)
HDL: 82.8 mg/dL (ref >39.00)
LDL CALC: 76 mg/dL (ref 0–99)
NonHDL: 88.38
TRIGLYCERIDES: 60 mg/dL (ref 0.0–149.0)
Total CHOL/HDL Ratio: 2
VLDL: 12 mg/dL (ref 0.0–40.0)

## 2017-05-10 LAB — CBC
HCT: 40.8 % (ref 36.0–46.0)
Hemoglobin: 13.4 g/dL (ref 12.0–15.0)
MCHC: 32.9 g/dL (ref 30.0–36.0)
MCV: 93.3 fl (ref 78.0–100.0)
PLATELETS: 254 10*3/uL (ref 150.0–400.0)
RBC: 4.38 Mil/uL (ref 3.87–5.11)
RDW: 14.2 % (ref 11.5–15.5)
WBC: 8.4 10*3/uL (ref 4.0–10.5)

## 2017-05-10 LAB — HEMOGLOBIN A1C: Hgb A1c MFr Bld: 5.8 % (ref 4.6–6.5)

## 2017-05-10 NOTE — Assessment & Plan Note (Signed)
Well controlled, no changes to meds. Encouraged heart healthy diet such as the DASH diet and exercise as tolerated.  °

## 2017-05-10 NOTE — Assessment & Plan Note (Signed)
hgba1c acceptable, minimize simple carbs. Increase exercise as tolerated.  

## 2017-05-10 NOTE — Patient Instructions (Signed)
shingrix is the new shingles, 2 shots over 2-6 months Hypertension Hypertension is another name for high blood pressure. High blood pressure forces your heart to work harder to pump blood. This can cause problems over time. There are two numbers in a blood pressure reading. There is a top number (systolic) over a bottom number (diastolic). It is best to have a blood pressure below 120/80. Healthy choices can help lower your blood pressure. You may need medicine to help lower your blood pressure if:  Your blood pressure cannot be lowered with healthy choices.  Your blood pressure is higher than 130/80.  Follow these instructions at home: Eating and drinking  If directed, follow the DASH eating plan. This diet includes: ? Filling half of your plate at each meal with fruits and vegetables. ? Filling one quarter of your plate at each meal with whole grains. Whole grains include whole wheat pasta, brown rice, and whole grain bread. ? Eating or drinking low-fat dairy products, such as skim milk or low-fat yogurt. ? Filling one quarter of your plate at each meal with low-fat (lean) proteins. Low-fat proteins include fish, skinless chicken, eggs, beans, and tofu. ? Avoiding fatty meat, cured and processed meat, or chicken with skin. ? Avoiding premade or processed food.  Eat less than 1,500 mg of salt (sodium) a day.  Limit alcohol use to no more than 1 drink a day for nonpregnant women and 2 drinks a day for men. One drink equals 12 oz of beer, 5 oz of wine, or 1 oz of hard liquor. Lifestyle  Work with your doctor to stay at a healthy weight or to lose weight. Ask your doctor what the best weight is for you.  Get at least 30 minutes of exercise that causes your heart to beat faster (aerobic exercise) most days of the week. This may include walking, swimming, or biking.  Get at least 30 minutes of exercise that strengthens your muscles (resistance exercise) at least 3 days a week. This may  include lifting weights or pilates.  Do not use any products that contain nicotine or tobacco. This includes cigarettes and e-cigarettes. If you need help quitting, ask your doctor.  Check your blood pressure at home as told by your doctor.  Keep all follow-up visits as told by your doctor. This is important. Medicines  Take over-the-counter and prescription medicines only as told by your doctor. Follow directions carefully.  Do not skip doses of blood pressure medicine. The medicine does not work as well if you skip doses. Skipping doses also puts you at risk for problems.  Ask your doctor about side effects or reactions to medicines that you should watch for. Contact a doctor if:  You think you are having a reaction to the medicine you are taking.  You have headaches that keep coming back (recurring).  You feel dizzy.  You have swelling in your ankles.  You have trouble with your vision. Get help right away if:  You get a very bad headache.  You start to feel confused.  You feel weak or numb.  You feel faint.  You get very bad pain in your: ? Chest. ? Belly (abdomen).  You throw up (vomit) more than once.  You have trouble breathing. Summary  Hypertension is another name for high blood pressure.  Making healthy choices can help lower blood pressure. If your blood pressure cannot be controlled with healthy choices, you may need to take medicine. This information is not intended  to replace advice given to you by your health care provider. Make sure you discuss any questions you have with your health care provider. Document Released: 10/25/2007 Document Revised: 04/05/2016 Document Reviewed: 04/05/2016 Elsevier Interactive Patient Education  [employer].

## 2017-05-10 NOTE — Assessment & Plan Note (Signed)
Struggles with chronic daily pain and continues to work full time. Has been following with rheumatology and slowly titrating down on her pain meds but still gets relief and has not significant side effects from her pain meds. UDS and contract UTD

## 2017-05-10 NOTE — Progress Notes (Signed)
Subjective:  I acted as a Education administrator for Dr. Charlett Blake. [name], [place]  Patient ID: PEARSON REASONS, female    DOB: 1961/02/18, 56 y.o.   MRN: [id number]  No chief complaint on file.   HPI  Patient is in today for a follow up. Overall she is doing well.  She has been started on a new medication by rheumatology and while her daily pain is still significant she does note she has had no flares bad enough to keep her out of work.  Continues to struggle with daily back pain and bilateral hand pain.  Her medications help her to work full-time but do not fully control her pain.  She denies any concerning side effects.  She has had no recent febrile illness or acute hospitalizations. Denies CP/palp/SOB/HA/congestion/fevers/GI or GU c/o. Taking meds as prescribed  Patient Care Team: Mosie Lukes, MD as PCP - General (Family Medicine) Jovita Gamma, MD (Neurosurgery) Unice Bailey, MD (Rheumatology) Elsie Saas, MD (Orthopedic Surgery)   Past Medical History:  Diagnosis Date  . Acute bronchitis 01/05/2014  . Arthritis   . Chicken pox as a child  . Dermatitis 09/09/2014  . Ganglion cyst of left foot 09/09/2014  . H/O mumps   . H/O tobacco use, presenting hazards to health 10/08/2009   Qualifier: Diagnosis of  By: Linda Hedges MD, Heinz Knuckles  Last cigarette 02/16/2016   . History of chicken pox   . Hyperglycemia 11/30/2015  . Hypertension   . Morbid obesity ([place]) 08/31/2009   Qualifier: Diagnosis of  By: Linda Hedges MD, Heinz Knuckles  BMI 34.4(Sept '13) BMI 34 (March '15)   . Mumps as a child  . Osteopenia 01/02/2017  . Osteoporosis 11/20/2016  . Psoriatic arthritis ([place])   . Rheumatoid arthritis ([place])   . Shingles 2014    Past Surgical History:  Procedure Laterality Date  . BACK SURGERY  2003  . NECK SURGERY  02/17/2016   C4, C5 & C6  . rotater cuff  20 yrs ago   left shoulder  . TOOTH EXTRACTION  05/2012  . WISDOM TOOTH EXTRACTION  56 yrs old    Family History  Problem Relation Age of Onset  .  Parkinson's disease Mother   . Heart disease Mother   . Hypertension Mother   . Diabetes Father        type 2  . Hypertension Father   . Stroke Father   . Vascular Disease Brother   . Hypertension Brother   . Diabetes Brother   . Heart attack Paternal Uncle     Social History   Socioeconomic History  . Marital status: Divorced    Spouse name: Not on file  . Number of children: 0  . Years of education: Not on file  . Highest education level: Not on file  Social Needs  . Financial resource strain: Not on file  . Food insecurity - worry: Not on file  . Food insecurity - inability: Not on file  . Transportation needs - medical: Not on file  . Transportation needs - non-medical: Not on file  Occupational History  . Occupation: help Therapist, sports  Tobacco Use  . Smoking status: Former Smoker    Start date: 11/30/2015  . Smokeless tobacco: Never Used  Substance and Sexual Activity  . Alcohol use: Yes    Comment: occ wine  . Drug use: No  . Sexual activity: No    Comment: no dietary restrictions. lives alone  Other Topics Concern  .  Not on file  Social History Narrative  . Not on file    Outpatient Medications Prior to Visit  Medication Sig Dispense Refill  . albuterol (VENTOLIN HFA) 108 (90 Base) MCG/ACT inhaler inhale 2 puffs by mouth every 6 hours if needed for wheezing 18 g 8  . Baricitinib (OLUMIANT) 2 MG TABS Take 2 mg by mouth daily.    Marland Kitchen CARTIA XT 240 MG 24 hr capsule take 1 capsule by mouth once daily 30 capsule 5  . citalopram (CELEXA) 20 MG tablet TAKE 1 TABLET BY MOUTH EVERY DAY FOR ANXIETY OR DEPRESSION 30 tablet 0  . fentaNYL (DURAGESIC) 75 MCG/HR Place 1 patch (75 mcg total) onto the skin every 3 (three) days. OK to fill on or after 02/02/17 10 patch 0  . gabapentin (NEURONTIN) 600 MG tablet take 1 tablet by mouth three times a day 90 tablet 5  . ibuprofen (ADVIL,MOTRIN) 800 MG tablet     . losartan-hydrochlorothiazide (HYZAAR) 50-12.5 MG tablet take 1  tablet by mouth once daily 30 tablet 5  . oxyCODONE-acetaminophen (PERCOCET) 10-325 MG tablet Take 2 tablets by mouth every 4 (four) hours as needed for pain. Can fill on or after 01/05/2017 150 tablet 0  . predniSONE (DELTASONE) 5 MG tablet Take 20 mg by mouth daily.   0  . venlafaxine XR (EFFEXOR XR) 75 MG 24 hr capsule Take 1 capsule (75 mg total) by mouth daily with breakfast. 30 capsule 2  . diltiazem (TIAZAC) 240 MG 24 hr capsule TAKE ONE CAPSULE BY MOUTH ONCE DAILY 30 capsule 0   No facility-administered medications prior to visit.     Allergies  Allergen Reactions  . Simponi [Golimumab] Rash    States that it was systemic    Review of Systems  Constitutional: Negative for fever and malaise/fatigue.  HENT: Negative for congestion.   Eyes: Negative for blurred vision.  Respiratory: Negative for shortness of breath.   Cardiovascular: Negative for chest pain, palpitations and leg swelling.  Gastrointestinal: Negative for abdominal pain, blood in stool and nausea.  Genitourinary: Negative for dysuria and frequency.  Musculoskeletal: Negative for falls.  Skin: Negative for rash.  Neurological: Negative for dizziness, loss of consciousness and headaches.  Endo/Heme/Allergies: Negative for environmental allergies.  Psychiatric/Behavioral: Negative for depression. The patient is not nervous/anxious.        Objective:    Physical Exam  Constitutional: She is oriented to person, place, and time. She appears well-developed and well-nourished. No distress.  HENT:  Head: Normocephalic and atraumatic.  Nose: Nose normal.  Eyes: Right eye exhibits no discharge. Left eye exhibits no discharge.  Neck: Normal range of motion. Neck supple.  Cardiovascular: Normal rate and regular rhythm.  No murmur heard. Pulmonary/Chest: Effort normal and breath sounds normal.  Abdominal: Soft. Bowel sounds are normal. There is no tenderness.  Musculoskeletal: She exhibits no edema.  Neurological:  She is alert and oriented to person, place, and time.  Skin: Skin is warm and dry.  Psychiatric: She has a normal mood and affect.  Nursing note and vitals reviewed.   BP 132/68 (BP Location: Left Arm, Patient Position: Sitting, Cuff Size: Normal)   Pulse 73   Temp 98 F (36.7 C) (Oral)   Resp 18   Wt 251 lb 3.2 oz (113.9 kg)   SpO2 98%   BMI 35.04 kg/m  Wt Readings from Last 3 Encounters:  05/10/17 251 lb 3.2 oz (113.9 kg)  01/02/17 246 lb (111.6 kg)  11/20/16 244 lb  9.6 oz (110.9 kg)   BP Readings from Last 3 Encounters:  05/10/17 132/68  01/02/17 140/84  11/20/16 128/82     There is no immunization history for the selected administration types on file for this patient.  Health Maintenance  Topic Date Due  . Hepatitis C Screening  1961/01/20  . HIV Screening  09/17/1974  . PAP SMEAR  05/22/2008  . COLONOSCOPY  09/16/2009  . INFLUENZA VACCINE  01/10/2018 (Originally 12/20/2016)  . MAMMOGRAM  06/30/2017  . TETANUS/TDAP  08/08/2024    Lab Results  Component Value Date   WBC 9.9 12/26/2016   HGB 14.0 12/26/2016   HCT 42.0 12/26/2016   PLT 255.0 12/26/2016   GLUCOSE 122 (H) 12/26/2016   CHOL 180 12/26/2016   TRIG 80.0 12/26/2016   HDL 93.20 12/26/2016   LDLCALC 71 12/26/2016   ALT 22 12/26/2016   AST 19 12/26/2016   NA 138 12/26/2016   K 4.2 12/26/2016   CL 99 12/26/2016   CREATININE 0.74 12/26/2016   BUN 14 12/26/2016   CO2 31 12/26/2016   TSH 1.82 12/26/2016   HGBA1C 5.9 12/26/2016    Lab Results  Component Value Date   TSH 1.82 12/26/2016   Lab Results  Component Value Date   WBC 9.9 12/26/2016   HGB 14.0 12/26/2016   HCT 42.0 12/26/2016   MCV 91.5 12/26/2016   PLT 255.0 12/26/2016   Lab Results  Component Value Date   NA 138 12/26/2016   K 4.2 12/26/2016   CO2 31 12/26/2016   GLUCOSE 122 (H) 12/26/2016   BUN 14 12/26/2016   CREATININE 0.74 12/26/2016   BILITOT 0.9 12/26/2016   ALKPHOS 76 12/26/2016   AST 19 12/26/2016   ALT 22  12/26/2016   PROT 7.5 12/26/2016   ALBUMIN 3.9 12/26/2016   CALCIUM 9.4 12/26/2016   ANIONGAP 8 11/26/2014   GFR 85.89 12/26/2016   Lab Results  Component Value Date   CHOL 180 12/26/2016   Lab Results  Component Value Date   HDL 93.20 12/26/2016   Lab Results  Component Value Date   LDLCALC 71 12/26/2016   Lab Results  Component Value Date   TRIG 80.0 12/26/2016   Lab Results  Component Value Date   CHOLHDL 2 12/26/2016   Lab Results  Component Value Date   HGBA1C 5.9 12/26/2016         Assessment & Plan:   Problem List Items Addressed This Visit    Depression with anxiety    Is doing counseling and is on Venlafaxine and Citalopram and is doing better.      Rheumatoid arthritis ([place])    Flares have calmed down with new medicine, Olumiant. Is following with Dr Dossie Der      Hypertension    Well controlled, no changes to meds. Encouraged heart healthy diet such as the DASH diet and exercise as tolerated.       Relevant Orders   CBC   Comprehensive metabolic panel   Lipid panel   TSH   Hyperglycemia    hgba1c acceptable, minimize simple carbs. Increase exercise as tolerated.      Relevant Orders   Hemoglobin A1c   Lipid panel      I have discontinued Torian M. Coker's diltiazem. I am also having her maintain her ibuprofen, predniSONE, CARTIA XT, gabapentin, losartan-hydrochlorothiazide, Baricitinib, albuterol, venlafaxine XR, oxyCODONE-acetaminophen, fentaNYL, and citalopram.  No orders of the defined types were placed in this encounter.   CMA served as  scribe during this visit. History, Physical and Plan performed by medical provider. Documentation and orders reviewed and attested to.  Penni Homans, MD

## 2017-05-10 NOTE — Assessment & Plan Note (Signed)
Is doing counseling and is on Venlafaxine and Citalopram and is doing better.

## 2017-05-10 NOTE — Assessment & Plan Note (Signed)
Flares have calmed down with new medicine, Olumiant. Is following with Dr Dossie Der

## 2017-05-10 NOTE — Assessment & Plan Note (Signed)
Encouraged DASH diet, decrease po intake and increase exercise as tolerated. Needs 7-8 hours of sleep nightly. Avoid trans fats, eat small, frequent meals every 4-5 hours with lean proteins, complex carbs and healthy fats. Minimize simple carbs 

## 2017-05-19 ENCOUNTER — Other Ambulatory Visit: Payer: Self-pay | Admitting: Family Medicine

## 2017-05-24 ENCOUNTER — Other Ambulatory Visit: Payer: Self-pay | Admitting: Family Medicine

## 2017-05-24 DIAGNOSIS — R52 Pain, unspecified: Secondary | ICD-10-CM

## 2017-05-24 DIAGNOSIS — M069 Rheumatoid arthritis, unspecified: Secondary | ICD-10-CM

## 2017-05-24 MED ORDER — OXYCODONE-ACETAMINOPHEN 10-325 MG PO TABS: 2.0000 | ORAL_TABLET | ORAL | 0 refills | Status: DC | PRN

## 2017-05-24 MED ORDER — FENTANYL 75 MCG/HR TD PT72: 75.0000 ug | MEDICATED_PATCH | TRANSDERMAL | 0 refills | Status: DC

## 2017-05-24 NOTE — Telephone Encounter (Signed)
Requesting:Fentanyl   &   Percocet  Contract: yes UDS: low risk next screen 10/27/17 Last OV: 05/10/17 Last Refill:04/27/17 Fentanyl #10-0rf Percocet #150-0rf  Please Advise

## 2017-05-24 NOTE — Telephone Encounter (Signed)
Copied from [place] [phone]. Topic: Quick Communication - See Telephone Encounter >> May 24, 2017  2:45 PM Cleaster Corin, NT wrote: CRM for notification. See Telephone encounter for:   05/24/17. Pt. Calling to get refill on med fentanyl patch and percocet pt. Can be reached at [phone]

## 2017-05-25 NOTE — Telephone Encounter (Signed)
rx signed and ready for pick up

## 2017-06-01 ENCOUNTER — Other Ambulatory Visit: Payer: Self-pay | Admitting: Family Medicine

## 2017-06-04 ENCOUNTER — Other Ambulatory Visit: Payer: Self-pay | Admitting: Family Medicine

## 2017-06-06 ENCOUNTER — Ambulatory Visit: Payer: 59 | Admitting: Psychology

## 2017-06-13 ENCOUNTER — Other Ambulatory Visit: Payer: Self-pay

## 2017-06-13 ENCOUNTER — Telehealth: Payer: Self-pay | Admitting: Family Medicine

## 2017-06-13 MED ORDER — LOSARTAN POTASSIUM-HCTZ 50-12.5 MG PO TABS: 1.0000 | ORAL_TABLET | Freq: Every day | ORAL | 5 refills | Status: DC

## 2017-06-13 NOTE — Telephone Encounter (Signed)
Spoke with pt regarding Rx pt had refills however they were sent to the wrong pharmacy. Rx has been sent.

## 2017-06-13 NOTE — Telephone Encounter (Unsigned)
Copied from [place]. Topic: Quick Communication - See Telephone Encounter >> Jun 13, 2017  1:11 PM Hewitt Shorts wrote: CRM for notification. See Telephone encounter for:  Pt is needing her losarton refilled   Pharmacy is [facility] [place]   Best number [phone] 06/13/17.

## 2017-06-13 NOTE — Telephone Encounter (Signed)
This med, losartan-hydrochlorothiazide (HYZAAR) 50-12.5 MG, has not been refilled since 12/04/16. Then it was only for 30 tablets. Is this patient still taking it. Please advise.

## 2017-06-20 ENCOUNTER — Other Ambulatory Visit: Payer: Self-pay | Admitting: Family Medicine

## 2017-06-21 ENCOUNTER — Telehealth: Payer: Self-pay | Admitting: Family Medicine

## 2017-06-21 DIAGNOSIS — R52 Pain, unspecified: Secondary | ICD-10-CM

## 2017-06-21 DIAGNOSIS — M069 Rheumatoid arthritis, unspecified: Secondary | ICD-10-CM

## 2017-06-21 MED ORDER — FENTANYL 75 MCG/HR TD PT72: 75.0000 ug | MEDICATED_PATCH | TRANSDERMAL | 0 refills | Status: DC

## 2017-06-21 MED ORDER — OXYCODONE-ACETAMINOPHEN 10-325 MG PO TABS: 2.0000 | ORAL_TABLET | ORAL | 0 refills | Status: DC | PRN

## 2017-06-21 MED ORDER — OXYCODONE-ACETAMINOPHEN 10-325 MG PO TABS
2.0000 | ORAL_TABLET | ORAL | 0 refills | Status: DC | PRN
Start: 2017-06-21 — End: 2017-06-21

## 2017-06-21 NOTE — Telephone Encounter (Signed)
Copied from [place] [phone]. Topic: Quick Communication - Rx Refill/Question >> Jun 21, 2017  9:57 AM Patrice Paradise wrote: Medication:  fentaNYL (DURAGESIC) 75 MCG/HR  oxyCODONE-acetaminophen (PERCOCET) 10-325 MG tablet    Has the patient contacted their pharmacy? Yes.     Agent: Please be advised that RX refills may take up to 3 business days. We ask that you follow-up with your pharmacy.

## 2017-06-21 NOTE — Telephone Encounter (Signed)
See attached

## 2017-06-21 NOTE — Telephone Encounter (Addendum)
°  Relation to DE:YCXK Call back number:[phone]  Pharmacy:  [facility] [place], [place], [place] [address] [phone]    Reason for call:  Patient requesting oxyCODONE-acetaminophen (PERCOCET) 10-325 MG tablet, please send to [place], [place], [place] [address] [phone] instead of [place] [phone], please advise  fentaNYL ([place]) 64 MCG/HR will remain at [place], [place] [address]

## 2017-06-21 NOTE — Addendum Note (Signed)
Addended by: Magdalene Molly A on: 06/21/2017 01:32 PM   Modules accepted: Orders

## 2017-06-21 NOTE — Telephone Encounter (Signed)
Requesting:Fentanyl  &  Percocet  Contract:yes UDS::low risk next screen 10/27/17 Last OV:05/10/17 Next OV:08/09/17 Last Refill:05/24/17 Fentanyl #10 patches -0rf Percocet #150-0rf   Please advise

## 2017-06-21 NOTE — Telephone Encounter (Signed)
Patient said she needs to pick these up at the office. Please advise.   Call when ready for pick up [phone]

## 2017-06-21 NOTE — Telephone Encounter (Signed)
Refill request for provider review  Fentanyl 75 mcg/HR Percocet 10-325 mg  LOV: 05/10/2017  Pharmacy: left message for patient to verify if pharmacy has changed from [place]

## 2017-06-22 ENCOUNTER — Encounter: Payer: Self-pay | Admitting: Family Medicine

## 2017-06-22 ENCOUNTER — Other Ambulatory Visit: Payer: Self-pay

## 2017-06-22 DIAGNOSIS — M069 Rheumatoid arthritis, unspecified: Secondary | ICD-10-CM

## 2017-06-22 DIAGNOSIS — R52 Pain, unspecified: Secondary | ICD-10-CM

## 2017-06-22 MED ORDER — OXYCODONE-ACETAMINOPHEN 10-325 MG PO TABS: 2.0000 | ORAL_TABLET | ORAL | 0 refills | Status: DC | PRN

## 2017-06-22 NOTE — Telephone Encounter (Signed)
Pt is calling stating she has [facility] so that it can be sent into the [facility].

## 2017-06-22 NOTE — Telephone Encounter (Signed)
This encounter was created in error - please disregard.

## 2017-06-22 NOTE — Telephone Encounter (Signed)
Medication was sent to the pharmacy she has listed in her chart  The medication is a control substance and will not be sent to another pharmacy

## 2017-06-28 ENCOUNTER — Ambulatory Visit: Payer: 59 | Admitting: Psychology

## 2017-06-28 ENCOUNTER — Other Ambulatory Visit: Payer: Self-pay | Admitting: Family Medicine

## 2017-07-02 ENCOUNTER — Other Ambulatory Visit: Payer: Self-pay | Admitting: Family Medicine

## 2017-07-05 ENCOUNTER — Ambulatory Visit (INDEPENDENT_AMBULATORY_CARE_PROVIDER_SITE_OTHER): Payer: 59 | Admitting: Psychology

## 2017-07-05 DIAGNOSIS — F4321 Adjustment disorder with depressed mood: Secondary | ICD-10-CM

## 2017-07-20 ENCOUNTER — Telehealth: Payer: Self-pay | Admitting: Family Medicine

## 2017-07-20 DIAGNOSIS — M069 Rheumatoid arthritis, unspecified: Secondary | ICD-10-CM

## 2017-07-20 DIAGNOSIS — R52 Pain, unspecified: Secondary | ICD-10-CM

## 2017-07-20 NOTE — Telephone Encounter (Signed)
Pt came by the office wanting to explain personally to nurse that she is only needing refill for her oxycodone and not her patches fentanyl. Pt states was informed that the rx can now go directly to pharmacy but since she was so use to getting both rx at same time, does not need to do the fentanyl now, pt will call for this refill when needing it.

## 2017-07-20 NOTE — Telephone Encounter (Signed)
Copied from [place] [phone]. Topic: Quick Communication - See Telephone Encounter >> Jul 20, 2017 12:31 PM Conception Chancy, NT wrote: CRM for notification. See Telephone encounter for:  07/20/17.  Patient is calling and states she is needing a refill on oxyCODONE-acetaminophen (PERCOCET) 10-325 MG tablet . She had that refilled on 06/22/17. She does not need fentaNYL (DURAGESIC) 75 MCG/HR refilled right now. That was last filled for her on 07/04/17. She would also like for someone to call her from the office so she can explain everything to the actual office. Please advise. [facility] # [address], [place]  [address] [place] [address]  Phone: [phone] Fax: [phone]

## 2017-07-23 ENCOUNTER — Other Ambulatory Visit: Payer: Self-pay | Admitting: Family Medicine

## 2017-07-23 DIAGNOSIS — M069 Rheumatoid arthritis, unspecified: Secondary | ICD-10-CM

## 2017-07-23 DIAGNOSIS — R52 Pain, unspecified: Secondary | ICD-10-CM

## 2017-07-23 MED ORDER — OXYCODONE-ACETAMINOPHEN 10-325 MG PO TABS: 2.0000 | ORAL_TABLET | ORAL | 0 refills | Status: DC | PRN

## 2017-07-23 NOTE — Telephone Encounter (Signed)
Pt is requesting refill on oxycodone.  Last OV: 05/10/2017 Last Fill: 06/22/2017 #150 and 0RF UDS: 04/27/2017 Low risk CSC: 07/06/2017  Please advise.

## 2017-07-23 NOTE — Telephone Encounter (Signed)
Pt called to check the status of her Rx refill, contact pt to advise See telphone note below

## 2017-07-23 NOTE — Telephone Encounter (Signed)
I sent her refill to the pharmacy

## 2017-07-23 NOTE — Telephone Encounter (Signed)
Last OV: 05/10/17 PCP: [place]: [place] # [address] [place] [phone] (Phone) [phone] (Fax)

## 2017-07-24 ENCOUNTER — Other Ambulatory Visit: Payer: Self-pay | Admitting: Family Medicine

## 2017-07-24 DIAGNOSIS — M069 Rheumatoid arthritis, unspecified: Secondary | ICD-10-CM

## 2017-07-24 DIAGNOSIS — R52 Pain, unspecified: Secondary | ICD-10-CM

## 2017-07-24 MED ORDER — OXYCODONE-ACETAMINOPHEN 10-325 MG PO TABS: 2.0000 | ORAL_TABLET | ORAL | 0 refills | Status: DC | PRN

## 2017-07-24 NOTE — Telephone Encounter (Signed)
Patient calling to see when her Rx for Percocet is going to be at the [facility] in [place] she states that she had the RX stopped at  [facility] so that it would go to the [facility] in [place] please call pt when its sent over she states that she is out of medicine

## 2017-07-24 NOTE — Telephone Encounter (Signed)
[facility] to [facility]  

## 2017-07-24 NOTE — Telephone Encounter (Signed)
Pt calling to check status on this medication being sent to [facility].

## 2017-07-24 NOTE — Telephone Encounter (Signed)
Pt calling to follow up on this med request. Per previous note and the contract she signed, her  oxyCODONE-acetaminophen (PERCOCET) 10-325 MG tablet Needs to go to [facility] on [address].  Please resend.  Pt is out and ask if you can please send asap.

## 2017-07-24 NOTE — Telephone Encounter (Signed)
Rx was sent to [facility] and was supposed to be sent to [facility].  Can you resend to [facility] cancelled oxycodone rx.

## 2017-07-25 NOTE — Telephone Encounter (Signed)
Pt called in to follow up on Rx, advised that medication has been sent to [facility]. Pt says that she is grateful.

## 2017-07-31 ENCOUNTER — Ambulatory Visit: Payer: 59 | Admitting: Psychology

## 2017-07-31 DIAGNOSIS — F4321 Adjustment disorder with depressed mood: Secondary | ICD-10-CM | POA: Diagnosis not present

## 2017-08-01 ENCOUNTER — Other Ambulatory Visit: Payer: Self-pay | Admitting: Family Medicine

## 2017-08-09 ENCOUNTER — Ambulatory Visit: Payer: 59 | Admitting: Family Medicine

## 2017-08-09 VITALS — BP 139/86 | HR 76 | Temp 97.8°F | Resp 18 | Wt 245.8 lb

## 2017-08-09 DIAGNOSIS — M05741 Rheumatoid arthritis with rheumatoid factor of right hand without organ or systems involvement: Secondary | ICD-10-CM | POA: Diagnosis not present

## 2017-08-09 DIAGNOSIS — M05742 Rheumatoid arthritis with rheumatoid factor of left hand without organ or systems involvement: Secondary | ICD-10-CM

## 2017-08-09 DIAGNOSIS — M069 Rheumatoid arthritis, unspecified: Secondary | ICD-10-CM

## 2017-08-09 DIAGNOSIS — M549 Dorsalgia, unspecified: Secondary | ICD-10-CM | POA: Diagnosis not present

## 2017-08-09 DIAGNOSIS — R52 Pain, unspecified: Secondary | ICD-10-CM

## 2017-08-09 DIAGNOSIS — M8580 Other specified disorders of bone density and structure, unspecified site: Secondary | ICD-10-CM | POA: Diagnosis not present

## 2017-08-09 MED ORDER — FENTANYL 75 MCG/HR TD PT72: 75.0000 ug | MEDICATED_PATCH | TRANSDERMAL | 0 refills | Status: DC

## 2017-08-09 MED ORDER — OXYCODONE-ACETAMINOPHEN 10-325 MG PO TABS
2.0000 | ORAL_TABLET | ORAL | 0 refills | Status: DC | PRN
Start: 2017-08-09 — End: 2017-09-17

## 2017-08-09 NOTE — Assessment & Plan Note (Signed)
Hands, elbow, wrist, toes  all flared with weather changes and with moving to [facility] this past week for a new job.

## 2017-08-09 NOTE — Assessment & Plan Note (Signed)
hgba1c acceptable, minimize simple carbs. Increase exercise as tolerated.  

## 2017-08-09 NOTE — Assessment & Plan Note (Signed)
Continues to struggle with daily pain but works full time and manages well with current dose of pain meds. She has utd UDS and contract

## 2017-08-09 NOTE — Assessment & Plan Note (Signed)
Well controlled, no changes to meds. Encouraged heart healthy diet such as the DASH diet and exercise as tolerated.  °

## 2017-08-09 NOTE — Assessment & Plan Note (Signed)
Encouraged DASH diet, decrease po intake and increase exercise as tolerated. Needs 7-8 hours of sleep nightly. Avoid trans fats, eat small, frequent meals every 4-5 hours with lean proteins, complex carbs and healthy fats. Minimize simple carbs 

## 2017-08-09 NOTE — Progress Notes (Signed)
Subjective:  I acted as a Education administrator for Dr. Charlett Blake. [name], [place]  Patient ID: Jennifer Kelly, female    DOB: 09-Feb-1961, 57 y.o.   MRN: [id number]  No chief complaint on file.   HPI  Patient is in today for a medication follow up and she notes significant increase in pain this week as she has been working hard to move to [place] to take a new job. She denies any recent febrile illness or hospitalization. She is tired and her hands, wrists, elbows and back are all very sore and swollen. Denies CP/palp/SOB/HA/congestion/fevers/GI or GU c/o. Taking meds as prescribed  Patient Care Team: Mosie Lukes, MD as PCP - General (Family Medicine) Jovita Gamma, MD (Neurosurgery) Unice Bailey, MD (Rheumatology) Elsie Saas, MD (Orthopedic Surgery)   Past Medical History:  Diagnosis Date  . Acute bronchitis 01/05/2014  . Arthritis   . Chicken pox as a child  . Dermatitis 09/09/2014  . Ganglion cyst of left foot 09/09/2014  . H/O mumps   . H/O tobacco use, presenting hazards to health 10/08/2009   Qualifier: Diagnosis of  By: Linda Hedges MD, Heinz Knuckles  Last cigarette 02/16/2016   . History of chicken pox   . Hyperglycemia 11/30/2015  . Hypertension   . Morbid obesity ([place]) 08/31/2009   Qualifier: Diagnosis of  By: Linda Hedges MD, Heinz Knuckles  BMI 34.4(Sept '13) BMI 34 (March '15)   . Mumps as a child  . Osteopenia 01/02/2017  . Osteoporosis 11/20/2016  . Psoriatic arthritis ([place])   . Rheumatoid arthritis ([place])   . Shingles 2014    Past Surgical History:  Procedure Laterality Date  . BACK SURGERY  2003  . NECK SURGERY  02/17/2016   C4, C5 & C6  . rotater cuff  20 yrs ago   left shoulder  . TOOTH EXTRACTION  05/2012  . WISDOM TOOTH EXTRACTION  57 yrs old    Family History  Problem Relation Age of Onset  . Parkinson's disease Mother   . Heart disease Mother   . Hypertension Mother   . Diabetes Father        type 2  . Hypertension Father   . Stroke Father   . Vascular Disease Brother     . Hypertension Brother   . Diabetes Brother   . Heart attack Paternal Uncle     Social History   Socioeconomic History  . Marital status: Divorced    Spouse name: Not on file  . Number of children: 0  . Years of education: Not on file  . Highest education level: Not on file  Occupational History  . Occupation: help Therapist, sports  Social Needs  . Financial resource strain: Not on file  . Food insecurity:    Worry: Not on file    Inability: Not on file  . Transportation needs:    Medical: Not on file    Non-medical: Not on file  Tobacco Use  . Smoking status: Former Smoker    Start date: 11/30/2015  . Smokeless tobacco: Never Used  Substance and Sexual Activity  . Alcohol use: Yes    Comment: occ wine  . Drug use: No  . Sexual activity: Never    Comment: no dietary restrictions. lives alone  Lifestyle  . Physical activity:    Days per week: Not on file    Minutes per session: Not on file  . Stress: Not on file  Relationships  . Social connections:    Talks on  phone: Not on file    Gets together: Not on file    Attends religious service: Not on file    Active member of club or organization: Not on file    Attends meetings of clubs or organizations: Not on file    Relationship status: Not on file  . Intimate partner violence:    Fear of current or ex partner: Not on file    Emotionally abused: Not on file    Physically abused: Not on file    Forced sexual activity: Not on file  Other Topics Concern  . Not on file  Social History Narrative  . Not on file    Outpatient Medications Prior to Visit  Medication Sig Dispense Refill  . albuterol (VENTOLIN HFA) 108 (90 Base) MCG/ACT inhaler inhale 2 puffs by mouth every 6 hours if needed for wheezing 18 g 8  . citalopram (CELEXA) 20 MG tablet TAKE 1 TABLET BY MOUTH EVERY DAY FOR ANXIETY OR DEPRESSION 30 tablet 0  . diltiazem (TIAZAC) 240 MG 24 hr capsule TAKE ONE CAPSULE BY MOUTH ONCE DAILY 30 capsule 0  .  gabapentin (NEURONTIN) 600 MG tablet TAKE 1 TABLET BY MOUTH THREE TIMES DAILY 90 tablet 0  . ibuprofen (ADVIL,MOTRIN) 800 MG tablet     . losartan-hydrochlorothiazide (HYZAAR) 50-12.5 MG tablet Take 1 tablet by mouth daily. 30 tablet 5  . predniSONE (DELTASONE) 5 MG tablet Take 20 mg by mouth daily.   0  . venlafaxine XR (EFFEXOR-XR) 75 MG 24 hr capsule TAKE 1 CAPSULE(75 MG) BY MOUTH DAILY WITH BREAKFAST 30 capsule 0  . Baricitinib (OLUMIANT) 2 MG TABS Take 2 mg by mouth daily.    Marland Kitchen CARTIA XT 240 MG 24 hr capsule take 1 capsule by mouth once daily 30 capsule 5  . fentaNYL (DURAGESIC) 75 MCG/HR Place 1 patch (75 mcg total) onto the skin every 3 (three) days. 10 patch 0  . oxyCODONE-acetaminophen (PERCOCET) 10-325 MG tablet Take 2 tablets by mouth every 4 (four) hours as needed for pain. 150 tablet 0   No facility-administered medications prior to visit.     Allergies  Allergen Reactions  . Simponi [Golimumab] Rash    States that it was systemic    Review of Systems  Constitutional: Negative for fever and malaise/fatigue.  HENT: Negative for congestion.   Eyes: Negative for blurred vision.  Respiratory: Negative for shortness of breath.   Cardiovascular: Negative for chest pain, palpitations and leg swelling.  Gastrointestinal: Negative for abdominal pain, blood in stool and nausea.  Genitourinary: Negative for dysuria and frequency.  Musculoskeletal: Positive for back pain and joint pain. Negative for falls and myalgias.  Skin: Negative for rash.  Neurological: Negative for dizziness, loss of consciousness and headaches.  Endo/Heme/Allergies: Negative for environmental allergies.  Psychiatric/Behavioral: Negative for depression. The patient is not nervous/anxious.        Objective:    Physical Exam  Constitutional: She is oriented to person, place, and time. She appears well-developed and well-nourished. No distress.  HENT:  Head: Normocephalic and atraumatic.  Nose: Nose  normal.  Eyes: Right eye exhibits no discharge. Left eye exhibits no discharge.  Neck: Normal range of motion. Neck supple.  Cardiovascular: Normal rate and regular rhythm.  No murmur heard. Pulmonary/Chest: Effort normal and breath sounds normal.  Abdominal: Soft. Bowel sounds are normal. There is no tenderness.  Musculoskeletal: She exhibits no edema.  Neurological: She is alert and oriented to person, place, and time.  Skin: Skin  is warm and dry.  Psychiatric: She has a normal mood and affect.  Nursing note and vitals reviewed.   BP 139/86   Pulse 76   Temp 97.8 F (36.6 C) (Oral)   Resp 18   Wt 245 lb 12.8 oz (111.5 kg)   SpO2 97%   BMI 34.28 kg/m  Wt Readings from Last 3 Encounters:  08/09/17 245 lb 12.8 oz (111.5 kg)  05/10/17 251 lb 3.2 oz (113.9 kg)  01/02/17 246 lb (111.6 kg)   BP Readings from Last 3 Encounters:  08/09/17 139/86  05/10/17 132/68  01/02/17 140/84     There is no immunization history for the selected administration types on file for this patient.  Health Maintenance  Topic Date Due  . Hepatitis C Screening  Apr 17, 1961  . HIV Screening  09/17/1974  . PAP SMEAR  05/22/2008  . COLONOSCOPY  09/16/2009  . MAMMOGRAM  06/30/2017  . INFLUENZA VACCINE  01/10/2018 (Originally 12/20/2016)  . TETANUS/TDAP  08/08/2024    Lab Results  Component Value Date   WBC 8.4 05/10/2017   HGB 13.4 05/10/2017   HCT 40.8 05/10/2017   PLT 254.0 05/10/2017   GLUCOSE 124 (H) 05/10/2017   CHOL 171 05/10/2017   TRIG 60.0 05/10/2017   HDL 82.80 05/10/2017   LDLCALC 76 05/10/2017   ALT 22 05/10/2017   AST 20 05/10/2017   NA 137 05/10/2017   K 4.2 05/10/2017   CL 100 05/10/2017   CREATININE 0.70 05/10/2017   BUN 17 05/10/2017   CO2 31 05/10/2017   TSH 0.78 05/10/2017   HGBA1C 5.8 05/10/2017    Lab Results  Component Value Date   TSH 0.78 05/10/2017   Lab Results  Component Value Date   WBC 8.4 05/10/2017   HGB 13.4 05/10/2017   HCT 40.8 05/10/2017    MCV 93.3 05/10/2017   PLT 254.0 05/10/2017   Lab Results  Component Value Date   NA 137 05/10/2017   K 4.2 05/10/2017   CO2 31 05/10/2017   GLUCOSE 124 (H) 05/10/2017   BUN 17 05/10/2017   CREATININE 0.70 05/10/2017   BILITOT 0.6 05/10/2017   ALKPHOS 77 05/10/2017   AST 20 05/10/2017   ALT 22 05/10/2017   PROT 7.5 05/10/2017   ALBUMIN 3.9 05/10/2017   CALCIUM 8.8 05/10/2017   ANIONGAP 8 11/26/2014   GFR 91.46 05/10/2017   Lab Results  Component Value Date   CHOL 171 05/10/2017   Lab Results  Component Value Date   HDL 82.80 05/10/2017   Lab Results  Component Value Date   LDLCALC 76 05/10/2017   Lab Results  Component Value Date   TRIG 60.0 05/10/2017   Lab Results  Component Value Date   CHOLHDL 2 05/10/2017   Lab Results  Component Value Date   HGBA1C 5.8 05/10/2017         Assessment & Plan:   Problem List Items Addressed This Visit    Morbid obesity ([place])    Encouraged DASH diet, decrease po intake and increase exercise as tolerated. Needs 7-8 hours of sleep nightly. Avoid trans fats, eat small, frequent meals every 4-5 hours with lean proteins, complex carbs and healthy fats. Minimize simple carbs.      Rheumatoid arthritis (HCC)    Hands, elbow, wrist, toes  all flared with weather changes and with moving to [place] this past week for a new job.       Relevant Medications   fentaNYL (DURAGESIC) 75 MCG/HR  oxyCODONE-acetaminophen (PERCOCET) 10-325 MG tablet   Other Relevant Orders   Pain Mgmt, Profile 8 w/Conf, U   Pain   Relevant Medications   oxyCODONE-acetaminophen (PERCOCET) 10-325 MG tablet   Osteopenia    Encouraged to get adequate exercise, calcium and vitamin d intake       Other Visit Diagnoses    Back pain, unspecified back location, unspecified back pain laterality, unspecified chronicity    -  Primary   Relevant Medications   fentaNYL (DURAGESIC) 75 MCG/HR   oxyCODONE-acetaminophen (PERCOCET) 10-325 MG tablet   Other  Relevant Orders   Pain Mgmt, Profile 8 w/Conf, U      I have discontinued Salaya M. Bogan's CARTIA XT and Baricitinib. I have also changed her oxyCODONE-acetaminophen. Additionally, I am having her maintain her ibuprofen, predniSONE, albuterol, losartan-hydrochlorothiazide, venlafaxine XR, diltiazem, citalopram, gabapentin, and fentaNYL.  Meds ordered this encounter  Medications  . fentaNYL (DURAGESIC) 75 MCG/HR    Sig: Place 1 patch (75 mcg total) onto the skin every 3 (three) days.    Dispense:  10 patch    Refill:  0  . oxyCODONE-acetaminophen (PERCOCET) 10-325 MG tablet    Sig: Take 2 tablets by mouth every 4 (four) hours as needed for pain. Can fill with on 08/22/17    Dispense:  150 tablet    Refill:  0    CMA served as scribe during this visit. History, Physical and Plan performed by medical provider. Documentation and orders reviewed and attested to.  Penni Homans, MD

## 2017-08-09 NOTE — Assessment & Plan Note (Signed)
Encouraged to get adequate exercise, calcium and vitamin d intake 

## 2017-08-13 LAB — PAIN MGMT, PROFILE 8 W/CONF, U
6 Acetylmorphine: NEGATIVE ng/mL (ref <10)
ALCOHOL METABOLITES: POSITIVE ng/mL — AB (ref <500)
AMPHETAMINES: NEGATIVE ng/mL (ref <500)
BUPRENORPHINE, URINE: NEGATIVE ng/mL (ref <5)
Benzodiazepines: NEGATIVE ng/mL (ref <100)
COCAINE METABOLITE: NEGATIVE ng/mL (ref <150)
CREATININE: 50.4 mg/dL
Codeine: NEGATIVE ng/mL (ref <50)
ETHYL SULFATE (ETS): 687 ng/mL — AB (ref <100)
Ethyl Glucuronide (ETG): NEGATIVE ng/mL (ref <500)
HYDROMORPHONE: NEGATIVE ng/mL (ref <50)
Hydrocodone: NEGATIVE ng/mL (ref <50)
MARIJUANA METABOLITE: NEGATIVE ng/mL (ref <20)
MDMA: NEGATIVE ng/mL (ref <500)
Morphine: NEGATIVE ng/mL (ref <50)
Norhydrocodone: NEGATIVE ng/mL (ref <50)
Noroxycodone: 2339 ng/mL — ABNORMAL HIGH (ref <50)
OXIDANT: NEGATIVE ug/mL (ref <200)
OXYCODONE: 2661 ng/mL — AB (ref <50)
OXYMORPHONE: 2620 ng/mL — AB (ref <50)
Opiates: NEGATIVE ng/mL (ref <100)
Oxycodone: POSITIVE ng/mL — AB (ref <100)
PH: 6.63 (ref 4.5–9.0)

## 2017-08-21 ENCOUNTER — Other Ambulatory Visit: Payer: Self-pay

## 2017-08-21 MED ORDER — ALBUTEROL SULFATE HFA 108 (90 BASE) MCG/ACT IN AERS: INHALATION_SPRAY | RESPIRATORY_TRACT | 8 refills | Status: DC

## 2017-08-27 ENCOUNTER — Other Ambulatory Visit: Payer: Self-pay | Admitting: Family Medicine

## 2017-09-10 ENCOUNTER — Telehealth: Payer: Self-pay | Admitting: Family Medicine

## 2017-09-10 DIAGNOSIS — M05741 Rheumatoid arthritis with rheumatoid factor of right hand without organ or systems involvement: Secondary | ICD-10-CM

## 2017-09-10 DIAGNOSIS — M549 Dorsalgia, unspecified: Secondary | ICD-10-CM

## 2017-09-10 DIAGNOSIS — M05742 Rheumatoid arthritis with rheumatoid factor of left hand without organ or systems involvement: Principal | ICD-10-CM

## 2017-09-10 NOTE — Telephone Encounter (Signed)
Copied from [place] [id number]. Topic: Quick Communication - Rx Refill/Question >> Sep 10, 2017  1:58 PM Cleaster Corin, [place] wrote: Medication: fentaNYL ([employer]) 31 MCG/HR [[id number]]  Has the patient contacted their pharmacy? no (Agent: If no, request that the patient contact the pharmacy for the refill.) Preferred Pharmacy (with phone number or street name): [facility] - [place], [address] [facility] [place] [address] Phone: [phone] Fax: [phone]   Agent: Please be advised that RX refills may take up to 3 business days. We ask that you follow-up with your pharmacy.

## 2017-09-11 NOTE — Telephone Encounter (Signed)
Fentanyl refill Last OV: 08/09/17 Last Refill:08/09/17 Pharmacy: [facility] [phone] - [place], [place] [address] AT [place] [phone] (Phone) [phone] (Fax)

## 2017-09-13 NOTE — Telephone Encounter (Signed)
Patient called again about the status of this prescription because she can't go off of this medication cold [place].  It has bad side effects.

## 2017-09-13 NOTE — Telephone Encounter (Signed)
Pt calling to check status of fentanyl. Pt has been using these for years. She has been out since Sunday 09/09/17. She was not told Dr. Charlett Blake was out of the office. Pt asking if Percell Miller or covering provider can order for her as she has been having pain. Please call to advise at [phone].  [facility] [place] - [place], [place] [address] AT [place] [phone] (Phone) [phone] (Fax)

## 2017-09-13 NOTE — Telephone Encounter (Signed)
Requesting: DURAGESIC 75 MCG/HR  Contract: 07/06/17 UDS: 08/09/17  Last OV: 08/09/17 Next OV: 02/11/18 Last Refill: 08/09/17  #10   Please advise in the absence of Dr. Charlett Blake

## 2017-09-14 MED ORDER — FENTANYL 75 MCG/HR TD PT72: 75.0000 ug | MEDICATED_PATCH | TRANSDERMAL | 0 refills | Status: DC

## 2017-09-14 NOTE — Telephone Encounter (Signed)
Pt. Is needing Fentinyl has not been called in to [facility] on [address] in [place]    Patient calling, [facility] on [address], please sent to [facility] on [address]  [phone], that location does not have in stock

## 2017-09-14 NOTE — Telephone Encounter (Signed)
Patient calling, [facility] on [address], please sent to [facility] on [address]  [phone], that location does not have in stock

## 2017-09-14 NOTE — Telephone Encounter (Signed)
Pt. Jennifer Kelly on [place]  Closes at 7 pm

## 2017-09-14 NOTE — Telephone Encounter (Signed)
I apologize for delay, this was just routed to me at 4pm yesterday. Will refill for her.  It looks like she is on both oxycodone and fentanyl patches per Dr. Janett Labella; 08/22/2017  2  08/09/2017  Oxycodone-Acetaminophen 10-325  [address]  [id number]  Cos (8734)  0 173.08 MME Other  [place]  08/10/2017  2  08/09/2017  Fentanyl 75 Mcg/hr Patch  [address]  [id number]  Wal (2215)  0 180.00 MME Comm Ins  [place]  07/25/2017  1  07/24/2017  Oxycodone-Acetaminophen 10-325  [address]  [id number]  Cos (4291)  0 187.50 MME Other  [place]  07/11/2017  1  06/21/2017  Fentanyl 75 Mcg/hr Patch  [address]  [id number]  Wal (4521)  0 180.00 MME Comm Ins  [place]  06/22/2017  1  06/22/2017  Oxycodone-Acetaminophen 10-325  [address]  [id number]  Cos (4291)  0 173.08 MME Other  [place]  06/08/2017  1  05/24/2017  Fentanyl 75 Mcg/hr Patch  [address]  [id number]  Lucillie Garfinkel [phone]       Last visit ws last month UDS is UTD  It appears that this message was received in 4/22 but never routed to Dr. Randel Pigg or her CMA.  Will send her a message so she will be aware

## 2017-09-17 ENCOUNTER — Other Ambulatory Visit: Payer: Self-pay | Admitting: Family Medicine

## 2017-09-17 DIAGNOSIS — M05742 Rheumatoid arthritis with rheumatoid factor of left hand without organ or systems involvement: Secondary | ICD-10-CM

## 2017-09-17 DIAGNOSIS — M069 Rheumatoid arthritis, unspecified: Secondary | ICD-10-CM

## 2017-09-17 DIAGNOSIS — R52 Pain, unspecified: Secondary | ICD-10-CM

## 2017-09-17 DIAGNOSIS — M05741 Rheumatoid arthritis with rheumatoid factor of right hand without organ or systems involvement: Secondary | ICD-10-CM

## 2017-09-17 DIAGNOSIS — M549 Dorsalgia, unspecified: Secondary | ICD-10-CM

## 2017-09-17 MED ORDER — FENTANYL 75 MCG/HR TD PT72: 75.0000 ug | MEDICATED_PATCH | TRANSDERMAL | 0 refills | Status: DC

## 2017-09-17 MED ORDER — OXYCODONE-ACETAMINOPHEN 10-325 MG PO TABS: 2.0000 | ORAL_TABLET | ORAL | 0 refills | Status: DC | PRN

## 2017-09-17 NOTE — Telephone Encounter (Signed)
Copied from [place] [phone]. Topic: Quick Communication - Rx Refill/Question >> Sep 17, 2017  2:21 PM Ether Griffins B wrote: Medication: oxyCODONE-acetaminophen (PERCOCET) 10-325 MG tablet   Has the patient contacted their pharmacy? Yes.   (Agent: If no, request that the patient contact the pharmacy for the refill.)  Preferred Pharmacy (with phone number or street name): [facility] - [place], [place]  Please note the pharmacy has changed from last time it was filled.    Agent: Please be advised that RX refills may take up to 3 business days. We ask that you follow-up with your pharmacy.

## 2017-09-17 NOTE — Telephone Encounter (Signed)
Pt calling checking on status of the Fentinyl patches being switched to the [id number] - [place], [address] AT [place]. She has canceled the previous [facility]. She has been out of patches for 8 days now!

## 2017-09-17 NOTE — Telephone Encounter (Signed)
Requesting:fentanyl & percocet  Contract:yes  UDS:08/09/17 Last OV:08/09/17 Next OV:02/11/18 Last Refill: Percocet 08/22/17 #150-0rf Fentanyl 08/09/17  #10-0rf Database:no concerns   Please advise

## 2017-09-18 ENCOUNTER — Telehealth: Payer: Self-pay

## 2017-09-18 MED ORDER — OXYCODONE-ACETAMINOPHEN 10-325 MG PO TABS: 2.0000 | ORAL_TABLET | ORAL | 0 refills | Status: DC | PRN

## 2017-09-18 NOTE — Telephone Encounter (Signed)
Medication resent in to pharmacy

## 2017-09-18 NOTE — Addendum Note (Signed)
Addended by: Magdalene Molly A on: 09/18/2017 07:56 AM   Modules accepted: Orders

## 2017-09-18 NOTE — Telephone Encounter (Signed)
Medication was sent to wrong pharmacy.  Now attached to correct pharmacy

## 2017-09-18 NOTE — Telephone Encounter (Signed)
Medication has been sent into pharmacy °

## 2017-09-18 NOTE — Addendum Note (Signed)
Addended by: Magdalene Molly A on: 09/18/2017 03:34 PM   Modules accepted: Orders

## 2017-09-18 NOTE — Telephone Encounter (Signed)
PA initiated via Covermymeds; KEY: [id number]. Awaiting determination.

## 2017-09-18 NOTE — Telephone Encounter (Addendum)
Pt is calling the percocet was sent to wrong pharm. [facility]  [facility] [address] [place]

## 2017-09-19 ENCOUNTER — Telehealth: Payer: Self-pay | Admitting: Family Medicine

## 2017-09-19 ENCOUNTER — Telehealth: Payer: Self-pay

## 2017-09-19 DIAGNOSIS — R52 Pain, unspecified: Secondary | ICD-10-CM

## 2017-09-19 DIAGNOSIS — M05742 Rheumatoid arthritis with rheumatoid factor of left hand without organ or systems involvement: Principal | ICD-10-CM

## 2017-09-19 DIAGNOSIS — M05741 Rheumatoid arthritis with rheumatoid factor of right hand without organ or systems involvement: Secondary | ICD-10-CM

## 2017-09-19 DIAGNOSIS — M549 Dorsalgia, unspecified: Secondary | ICD-10-CM

## 2017-09-19 DIAGNOSIS — M069 Rheumatoid arthritis, unspecified: Secondary | ICD-10-CM

## 2017-09-19 NOTE — Telephone Encounter (Signed)
Author phoned pt. to make sure she was able to receive her percocet Rx, and pt. Confirmed that the pharmacy recently notified her that it was ready for pickup. Pt. appreciative of f/u call.

## 2017-09-19 NOTE — Telephone Encounter (Signed)
PA approved until 03/20/2018. File ID: [id number].

## 2017-09-19 NOTE — Telephone Encounter (Signed)
Author received a phone call from [facility] center regarding [facility] pharmacy's need to verify certain information about patient prior to filling percocet Rx. Author made PEC aware that neither Dr. Charlett Blake nor her MA were in the office today, but Pryor Curia would follow up with MA who is familiar with patient and is currently covering Dr. Frederik Pear [place]. Author told PEC to tell the patient that someone from the office would follow up with the patient today before close regarding medication status. Author spoke to [place] face to face, who is currently phoning the pharmacy.

## 2017-09-19 NOTE — Telephone Encounter (Signed)
Spoke with pharmacist she wanted to make sure PCP isn't related to patient and if pt have had a recent UDS done

## 2017-09-19 NOTE — Telephone Encounter (Signed)
Copied from [place]. Topic: Quick Communication - See Telephone Encounter >> Sep 19, 2017 10:27 AM Cleaster Corin, [place] wrote: CRM for notification. See Telephone encounter for: 09/19/17.  Jeanice Lim A pharmacist  at [facility] in [place] needing to speak with someone about pt. Med.  oxyCODONE-acetaminophen (PERCOCET) 10-325 MG tablet [[id number]] for. Pt. Also additional information (making sure pt. Isn't a family member, appt. Notes, last drug screen, and other med. That pt. Is on)  [facility] - [place], [place] [address] [facility] [place] [address] Phone: [phone] Fax: [phone]

## 2017-09-25 ENCOUNTER — Other Ambulatory Visit: Payer: Self-pay | Admitting: Family Medicine

## 2017-09-25 NOTE — Telephone Encounter (Signed)
Copied from [place] [id number]. Topic: General - Other >> Sep 25, 2017 11:46 AM Oneta Rack wrote:  Relation to pt: self Call back number: [phone] Pharmacy: [facility] [place], [place] [address] AT [place] [phone] (Phone) [phone] (Fax)   Reason for call:  Patient will be out town and will run out of medication, diltiazem (TIAZAC) 240 MG 24 hr capsule citalopram (CELEXA) 20 MG tablet , gabapentin (NEURONTIN) 600 MG tablet , venlafaxine XR (EFFEXOR-XR) 75 MG 24 hr capsule . Patient was informed to inform PCP, patient aware please allow 48 to 72 hour turn around, please advise

## 2017-09-26 NOTE — Telephone Encounter (Signed)
Hi. I am working on these refills, however I see Dr. Frederik Pear note at the patient's office visit on 05/10/17 that reads-She is discontinuing the Diltiazem.  I do see on the next refill request, it was discontinued in error and Diltiazem Hcl ER beads 240 MG was ordered. And that it continued to be ordered, last 08/29/17. I don't see a note by Dr. Charlett Blake that she added the medication back to patient's list.  Please confirm the patient is suppose to be on Diltiazem HCL 240 MG and I will continue the refill requests. Thank you.

## 2017-09-27 ENCOUNTER — Other Ambulatory Visit: Payer: Self-pay

## 2017-09-27 MED ORDER — DILTIAZEM HCL ER BEADS 240 MG PO CP24: 240.0000 mg | ORAL_CAPSULE | Freq: Every day | ORAL | 1 refills | Status: DC

## 2017-09-27 MED ORDER — VENLAFAXINE HCL ER 75 MG PO CP24: ORAL_CAPSULE | ORAL | 1 refills | Status: DC

## 2017-09-27 MED ORDER — GABAPENTIN 600 MG PO TABS: 600.0000 mg | ORAL_TABLET | Freq: Three times a day (TID) | ORAL | 0 refills | Status: DC

## 2017-09-27 MED ORDER — CITALOPRAM HYDROBROMIDE 20 MG PO TABS: ORAL_TABLET | ORAL | 0 refills | Status: DC

## 2017-09-27 NOTE — Telephone Encounter (Signed)
She d/c  Diltiazem (Cardizem) on 05/10/17 and ordered Diltiazem (Tiazac) on the same day.  Patient confirmed I have refilled rx patient made aware

## 2017-09-27 NOTE — Telephone Encounter (Signed)
Patient was made aware of rx

## 2017-09-27 NOTE — Telephone Encounter (Signed)
Pt medication was called into the wrong pharmacy. She is leaving for a trip out of the country tomorrow.  RIGHT PHARMACY:  [facility] [phone] - [place], [place] [address] AT [place] [phone] (Phone) [phone] (Fax)

## 2017-09-28 NOTE — Telephone Encounter (Signed)
So I am OK to refill if she can pick two places and does not move them

## 2017-09-28 NOTE — Telephone Encounter (Signed)
Spoke with patient and I made her aware of the multiple changes and how we need to pick only one pharmacy. She voiced her understanding

## 2017-10-17 ENCOUNTER — Other Ambulatory Visit: Payer: Self-pay | Admitting: Family Medicine

## 2017-10-17 DIAGNOSIS — M05741 Rheumatoid arthritis with rheumatoid factor of right hand without organ or systems involvement: Secondary | ICD-10-CM

## 2017-10-17 DIAGNOSIS — M05742 Rheumatoid arthritis with rheumatoid factor of left hand without organ or systems involvement: Secondary | ICD-10-CM

## 2017-10-17 DIAGNOSIS — R52 Pain, unspecified: Secondary | ICD-10-CM

## 2017-10-17 DIAGNOSIS — M549 Dorsalgia, unspecified: Secondary | ICD-10-CM

## 2017-10-17 DIAGNOSIS — M069 Rheumatoid arthritis, unspecified: Secondary | ICD-10-CM

## 2017-10-17 NOTE — Telephone Encounter (Signed)
Copied from [place] [id number]. Topic: Quick Communication - Rx Refill/Question >> Oct 17, 2017  2:10 PM [place], [place] wrote: Medication: oxyCODONE-acetaminophen (PERCOCET) 10-325 MG tablet   Preferred Pharmacy (with phone number or street name): [facility] - [place], [place] [phone] (Phone) [phone] (Fax)  Agent: Please be advised that RX refills may take up to 3 business days. We ask that you follow-up with your pharmacy.

## 2017-10-17 NOTE — Telephone Encounter (Signed)
Oxycodone refill Last Refill:09/18/17 #150 Last OV: 08/09/17 PCP: Dr. Charlett Blake Pharmacy: [place]  In [place]

## 2017-10-18 ENCOUNTER — Other Ambulatory Visit: Payer: Self-pay | Admitting: Family Medicine

## 2017-10-18 ENCOUNTER — Telehealth: Payer: Self-pay | Admitting: Family Medicine

## 2017-10-18 DIAGNOSIS — R52 Pain, unspecified: Secondary | ICD-10-CM

## 2017-10-18 DIAGNOSIS — M069 Rheumatoid arthritis, unspecified: Secondary | ICD-10-CM

## 2017-10-18 MED ORDER — OXYCODONE-ACETAMINOPHEN 10-325 MG PO TABS: 2.0000 | ORAL_TABLET | ORAL | 0 refills | Status: DC | PRN

## 2017-10-18 NOTE — Telephone Encounter (Signed)
[facility] [place]

## 2017-10-18 NOTE — Telephone Encounter (Signed)
Requesting: Oxycodone-acetaminophen 10-325mg  (2 tabs) every 4 hrs prn Contract: yes UDS: 08-09-17 Last OV: 08/09/17 Next OV: 02/11/18 Last refill: 09/18/17 #150 Database: OD risk score 510/999

## 2017-10-18 NOTE — Telephone Encounter (Signed)
Copied from [place] [id number]. Topic: Quick Communication - See Telephone Encounter >> Oct 18, 2017  4:38 PM Ivar Drape wrote: CRM for notification. See Telephone encounter for: 10/18/17. Patient would like a refill on her fentaNYL ([place]) 75 MCG/HR medication and have it sent to [facility] preferred pharmacy [facility]. In [place], [place]

## 2017-10-19 NOTE — Telephone Encounter (Signed)
Requesting:fentanyl Contract:yes UDS:low risk next screen 6/81/19 Last OV:08/09/17 Next DX:AJOINO 2019  Last Refill:09/17/17 Database:   Please advise     Her percocet's did not e-prescribe to her pharmacy yesterday. I have re set the rx to be sent in by you   Rx's set to preferred pharmacy: [facility]  [facility]

## 2017-10-19 NOTE — Telephone Encounter (Signed)
Dr Charlett Blake-- Pt called stating Rxs did not transmit to pharmacy. Please advise?

## 2017-10-19 NOTE — Telephone Encounter (Signed)
See refill encounter from 5/29

## 2017-10-21 MED ORDER — OXYCODONE-ACETAMINOPHEN 10-325 MG PO TABS: 2.0000 | ORAL_TABLET | ORAL | 0 refills | Status: DC | PRN

## 2017-10-21 MED ORDER — FENTANYL 75 MCG/HR TD PT72: 75.0000 ug | MEDICATED_PATCH | TRANSDERMAL | 0 refills | Status: DC

## 2017-11-01 ENCOUNTER — Other Ambulatory Visit: Payer: Self-pay

## 2017-11-01 MED ORDER — GABAPENTIN 600 MG PO TABS: 600.0000 mg | ORAL_TABLET | Freq: Three times a day (TID) | ORAL | 2 refills | Status: DC

## 2017-11-01 MED ORDER — CITALOPRAM HYDROBROMIDE 20 MG PO TABS: ORAL_TABLET | ORAL | 2 refills | Status: DC

## 2017-11-16 ENCOUNTER — Other Ambulatory Visit: Payer: Self-pay | Admitting: Family Medicine

## 2017-11-16 DIAGNOSIS — M05741 Rheumatoid arthritis with rheumatoid factor of right hand without organ or systems involvement: Secondary | ICD-10-CM

## 2017-11-16 DIAGNOSIS — R52 Pain, unspecified: Secondary | ICD-10-CM

## 2017-11-16 DIAGNOSIS — M069 Rheumatoid arthritis, unspecified: Secondary | ICD-10-CM

## 2017-11-16 DIAGNOSIS — M05742 Rheumatoid arthritis with rheumatoid factor of left hand without organ or systems involvement: Principal | ICD-10-CM

## 2017-11-16 DIAGNOSIS — M549 Dorsalgia, unspecified: Secondary | ICD-10-CM

## 2017-11-16 NOTE — Telephone Encounter (Signed)
Requesting:Percocet & Fentanyl  Contract:yes BTD:[id number] risk next screen 01/09/18 Last OV:08/09/17 Next OV:02/11/18 Last Refill:10/21/08 Percocet #150-0rf  Fentanyl #10-0rf Database:   Please advise    [facility] Fentanyl goes to [facility]

## 2017-11-16 NOTE — Telephone Encounter (Signed)
NOTE sent medications to different pharmacies to be more cost effective for pt. She states it has not been done correctly in the past.  Copied from [place] #[id number]. Topic: Quick Communication - Rx Refill/Question >> Nov 16, 2017 10:34 AM Margot Ables wrote: Medication: oxycodone - pt has 3 days left - this MUST go to [facility] - [place], [place] [phone] (Phone) [phone] (Fax)  Fentanyl patches - has 1 patch left - this MUST go to [facility] - [place], [place] AT [place] [phone] (Phone) [phone] (Fax)  Has the patient contacted their pharmacy? No -controlled

## 2017-11-17 ENCOUNTER — Other Ambulatory Visit: Payer: Self-pay | Admitting: Family Medicine

## 2017-11-17 DIAGNOSIS — M05741 Rheumatoid arthritis with rheumatoid factor of right hand without organ or systems involvement: Secondary | ICD-10-CM

## 2017-11-17 DIAGNOSIS — M05742 Rheumatoid arthritis with rheumatoid factor of left hand without organ or systems involvement: Principal | ICD-10-CM

## 2017-11-17 DIAGNOSIS — M549 Dorsalgia, unspecified: Secondary | ICD-10-CM

## 2017-11-17 MED ORDER — FENTANYL 75 MCG/HR TD PT72: 75.0000 ug | MEDICATED_PATCH | TRANSDERMAL | 0 refills | Status: DC

## 2017-11-17 MED ORDER — OXYCODONE-ACETAMINOPHEN 10-325 MG PO TABS: 2.0000 | ORAL_TABLET | ORAL | 0 refills | Status: DC | PRN

## 2017-11-17 NOTE — Telephone Encounter (Signed)
OK to refill both but since they have to go separate places they have to be sent to me separately with the appropriate pharmacies attached? If there is a way to do that next month was a lot of work to undo this and then send separately

## 2017-12-10 ENCOUNTER — Other Ambulatory Visit: Payer: Self-pay | Admitting: Family Medicine

## 2017-12-10 MED ORDER — VENLAFAXINE HCL ER 75 MG PO CP24: ORAL_CAPSULE | ORAL | 1 refills | Status: DC

## 2017-12-10 MED ORDER — DILTIAZEM HCL ER BEADS 240 MG PO CP24: 240.0000 mg | ORAL_CAPSULE | Freq: Every day | ORAL | 1 refills | Status: DC

## 2017-12-10 NOTE — Telephone Encounter (Signed)
Copied from [place] [id number]. Topic: Quick Communication - Rx Refill/Question >> Dec 10, 2017  9:36 AM Synthia Innocent wrote: Medication: diltiazem (TIAZAC) 240 MG 24 hr capsule and venlafaxine XR (EFFEXOR-XR) 75 MG 24 hr capsule   Has the patient contacted their pharmacy? Yes.   (Agent: If no, request that the patient contact the pharmacy for the refill.) (Agent: If yes, when and what did the pharmacy advise?)  Preferred Pharmacy (with phone number or street name): [facility]  Agent: Please be advised that RX refills may take up to 3 business days. We ask that you follow-up with your pharmacy.

## 2017-12-13 ENCOUNTER — Other Ambulatory Visit: Payer: Self-pay | Admitting: Family Medicine

## 2017-12-13 DIAGNOSIS — M069 Rheumatoid arthritis, unspecified: Secondary | ICD-10-CM

## 2017-12-13 DIAGNOSIS — R52 Pain, unspecified: Secondary | ICD-10-CM

## 2017-12-13 NOTE — Telephone Encounter (Signed)
Copied from [place] [phone]. Topic: Quick Communication - Rx Refill/Question >> Dec 13, 2017 11:34 AM Percell Belt A wrote: Medication: oxyCODONE-acetaminophen (PERCOCET) 10-325 MG tablet [[id number]]  Has the patient contacted their pharmacy?  No  (Agent: If no, request that the patient contact the pharmacy for the refill.) (Agent: If yes, when and what did the pharmacy advise?)  Preferred Pharmacy (with phone number or street name): [id number] - [place], [address] [phone] (Phone)   Agent: Please be advised that RX refills may take up to 3 business days. We ask that you follow-up with your pharmacy.

## 2017-12-14 ENCOUNTER — Telehealth: Payer: Self-pay

## 2017-12-14 ENCOUNTER — Other Ambulatory Visit: Payer: Self-pay | Admitting: Family Medicine

## 2017-12-14 ENCOUNTER — Telehealth: Payer: Self-pay | Admitting: Family Medicine

## 2017-12-14 DIAGNOSIS — R52 Pain, unspecified: Secondary | ICD-10-CM

## 2017-12-14 DIAGNOSIS — M069 Rheumatoid arthritis, unspecified: Secondary | ICD-10-CM

## 2017-12-14 MED ORDER — OXYCODONE-ACETAMINOPHEN 10-325 MG PO TABS: 2.0000 | ORAL_TABLET | ORAL | 0 refills | Status: DC | PRN

## 2017-12-14 NOTE — Telephone Encounter (Signed)
Copied from [place] [phone]. Topic: Quick Communication - Rx Refill/Question >> Dec 13, 2017 11:34 AM Jennifer Kelly wrote: Medication: oxyCODONE-acetaminophen (PERCOCET) 10-325 MG tablet [[id number]]  Has the patient contacted their pharmacy?  No  (Agent: If no, request that the patient contact the pharmacy for the refill.) (Agent: If yes, when and what did the pharmacy advise?)  Preferred Pharmacy (with phone number or street name): [id number] - [place], [address] [phone] (Phone)   Agent: Please be advised that RX refills may take up to 3 business days. We ask that you follow-up with your pharmacy. >> Dec 14, 2017 11:54 AM Jennifer Kelly wrote: Pt checking back on Rx.  Needing the refill before the end of work day today.

## 2017-12-14 NOTE — Telephone Encounter (Signed)
Request for controlled med: oxycodone-acetaminophen 10-325 mg tab  LR 11/17/17 for #150 tabs  LOV  08/09/17  NOV  02/11/18  Dr. Charlett Blake

## 2017-12-14 NOTE — Telephone Encounter (Signed)
Copied from [place] [phone]. Topic: General - Other >> Dec 14, 2017  3:04 PM Carolyn Stare wrote:  Arbie Cookey with [facility] in [place] call to say she is worried about pt liver since the pt is taking so much tylenol in the below med 3900 mg a day. Michela Pitcher pt has been on this dosage a long time and think this is worth looking into    -Dr. Nani Ravens Please advise  Dr. Frederik Pear patient

## 2017-12-14 NOTE — Telephone Encounter (Signed)
Jennifer Kelly Patient   Requesting:Percocet  Contract:yes KIS:[id number] risk next screen 01/09/18 Last OV:08/09/17 Next OV:02/11/18 Last Refill:11/17/17 #[id number] Database:   Please advise

## 2017-12-14 NOTE — Telephone Encounter (Signed)
Patient has already sent in 3 request for this medication. I have sent over the request to the Doctor covering for Dr. Frederik Pear absence. Once he has approved it she will be notified.

## 2017-12-14 NOTE — Telephone Encounter (Signed)
The last time liver function was checked it was normal in 04/2017. As long as she consistently is below 4 grams daily (4000 mg), I am OK with this. Will still route to Dr. Charlett Blake for her input as this is her patient. TY.

## 2017-12-16 ENCOUNTER — Other Ambulatory Visit: Payer: Self-pay | Admitting: Family Medicine

## 2017-12-16 DIAGNOSIS — M05742 Rheumatoid arthritis with rheumatoid factor of left hand without organ or systems involvement: Principal | ICD-10-CM

## 2017-12-16 DIAGNOSIS — M549 Dorsalgia, unspecified: Secondary | ICD-10-CM

## 2017-12-16 DIAGNOSIS — M05741 Rheumatoid arthritis with rheumatoid factor of right hand without organ or systems involvement: Secondary | ICD-10-CM

## 2017-12-16 MED ORDER — FENTANYL 75 MCG/HR TD PT72: 75.0000 ug | MEDICATED_PATCH | TRANSDERMAL | 0 refills | Status: DC

## 2017-12-16 NOTE — Telephone Encounter (Signed)
I sent in refill to [facility]

## 2018-01-10 ENCOUNTER — Other Ambulatory Visit: Payer: Self-pay | Admitting: Family Medicine

## 2018-01-10 DIAGNOSIS — M069 Rheumatoid arthritis, unspecified: Secondary | ICD-10-CM

## 2018-01-10 DIAGNOSIS — R52 Pain, unspecified: Secondary | ICD-10-CM

## 2018-01-10 NOTE — Telephone Encounter (Signed)
Refill of percocet  LRF 12/14/17  #150 0 refills  LOV 08/09/17 Dr. Merleen Milliner [facility] # [id number] [place].           [phone] (Phone) [phone] (Fax)

## 2018-01-10 NOTE — Telephone Encounter (Signed)
Copied from [place]. Topic: Quick Communication - Rx Refill/Question >> Jan 10, 2018  4:21 PM Gardiner Ramus wrote: Medication: oxyCODONE-acetaminophen (PERCOCET) 10-325 MG tablet [[id number]]  Has the patient contacted their pharmacy no  (Agent: If no, request that the patient contact the pharmacy for the refill.) (Agent: If yes, when and what did the pharmacy advise?)  Preferred Pharmacy (with phone number or street name): [facility] [id number] [place]. [phone] (Phone) [phone] (Fax)  Agent: Please be advised that RX refills may take up to 3 business days. We ask that you follow-up with your pharmacy.

## 2018-01-11 MED ORDER — OXYCODONE-ACETAMINOPHEN 10-325 MG PO TABS: 2.0000 | ORAL_TABLET | ORAL | 0 refills | Status: DC | PRN

## 2018-01-11 NOTE — Telephone Encounter (Signed)
Requesting:Percocet Contract:07/06/17 UDS:08/09/17 moderate risk  Last Visit:08/09/17 Next Visit:02/11/18 Last Refill:12/14/17   Please Advise

## 2018-01-13 ENCOUNTER — Other Ambulatory Visit: Payer: Self-pay | Admitting: Family Medicine

## 2018-01-25 ENCOUNTER — Other Ambulatory Visit: Payer: Self-pay | Admitting: Family Medicine

## 2018-01-25 DIAGNOSIS — M05742 Rheumatoid arthritis with rheumatoid factor of left hand without organ or systems involvement: Principal | ICD-10-CM

## 2018-01-25 DIAGNOSIS — M05741 Rheumatoid arthritis with rheumatoid factor of right hand without organ or systems involvement: Secondary | ICD-10-CM

## 2018-01-25 DIAGNOSIS — M549 Dorsalgia, unspecified: Secondary | ICD-10-CM

## 2018-01-25 NOTE — Addendum Note (Signed)
Addended by: Magdalene Molly A on: 01/25/2018 04:09 PM   Modules accepted: Orders

## 2018-01-25 NOTE — Telephone Encounter (Signed)
Requesting:fentanyl Contract:yes REV:[id number] risk next screen 01/09/18 Last OV:08/09/17 Next OV:02/11/18 Last Refill:12/16/17  #[id number] Database:   Please advise

## 2018-01-25 NOTE — Telephone Encounter (Signed)
Fentanyl patch refill Last Refill:12/16/17 # 10 Last OV: 08/09/17 PCP: Dr Penni Homans Pharmacy: [facility] [place], [place]

## 2018-01-25 NOTE — Telephone Encounter (Signed)
Copied from [place] [phone]. Topic: Quick Communication - Rx Refill/Question >> Jan 25, 2018  1:43 PM Burchel, Abbi R wrote: Medication: fentaNYL ([place]) 75 MCG/HR  Preferred Pharmacy:[id number] [place], [place] [address] [place] [address] [place] [address] Phone: [phone] Fax: [phone]   Pt was advised that RX refills may take up to 3 business days.

## 2018-01-26 MED ORDER — FENTANYL 75 MCG/HR TD PT72: 75.0000 ug | MEDICATED_PATCH | TRANSDERMAL | 0 refills | Status: DC

## 2018-01-28 ENCOUNTER — Other Ambulatory Visit: Payer: Self-pay | Admitting: Family Medicine

## 2018-01-30 IMAGING — US US ABDOMEN COMPLETE
1 series · 14 of 25 positions shown · non-contrast
Comparison: CT abdomen pelvis 11/26/2014

CLINICAL DATA: Epigastric pain

EXAM:
ABDOMEN ULTRASOUND COMPLETE

[Series 1: us abdomen complete · 0.25mm/px · 14 of 114 slices shown]
[im 1/114]
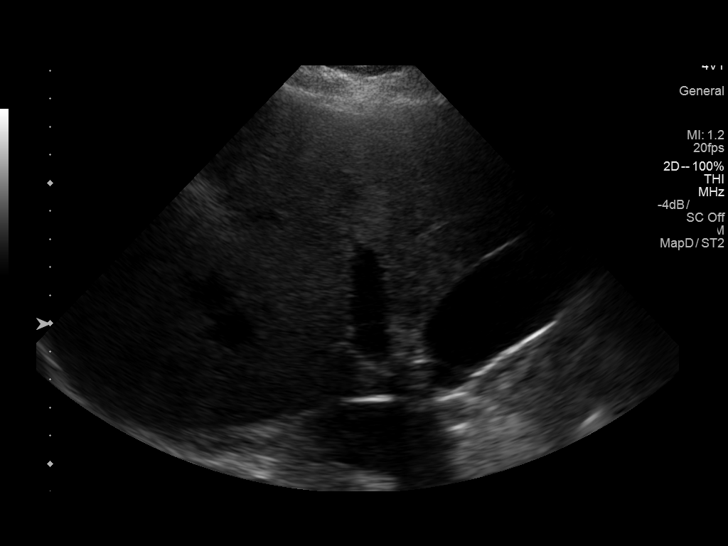
[im 10/114]
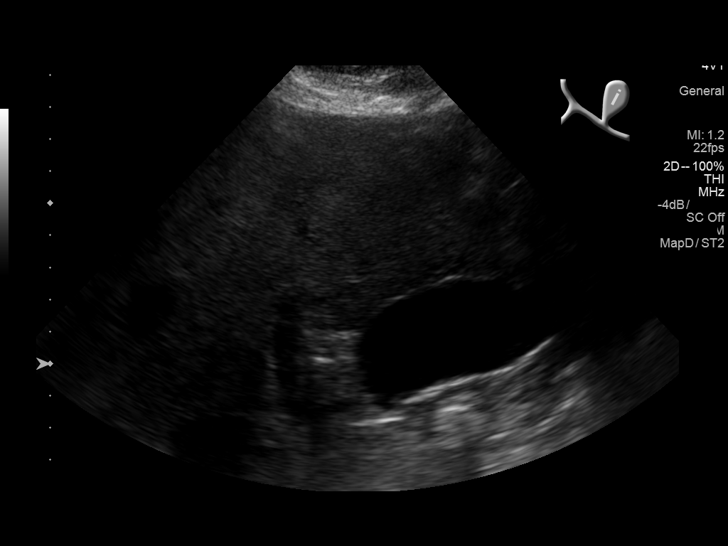
[im 19/114]
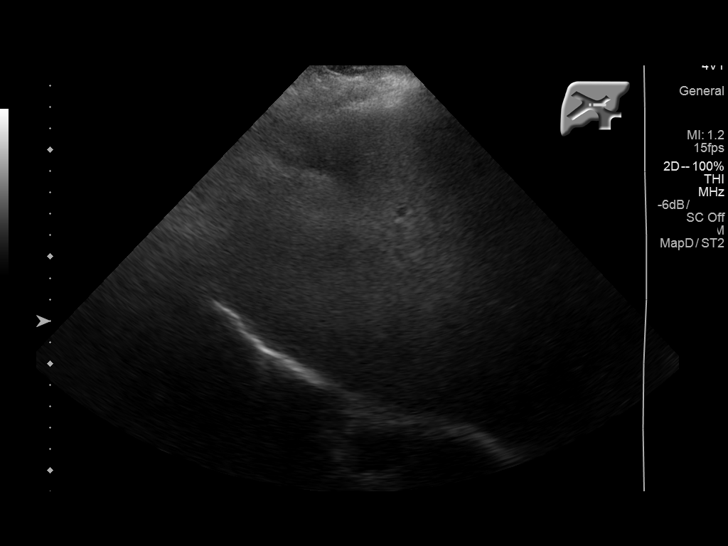
[im 29/114]
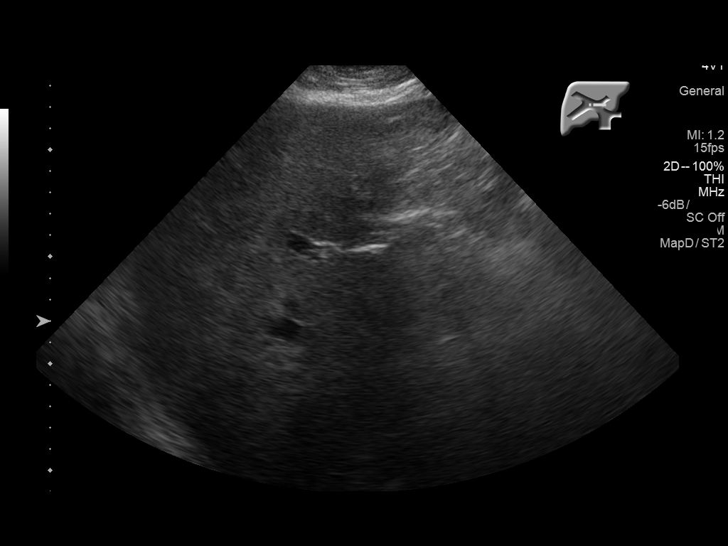
[im 38/114]
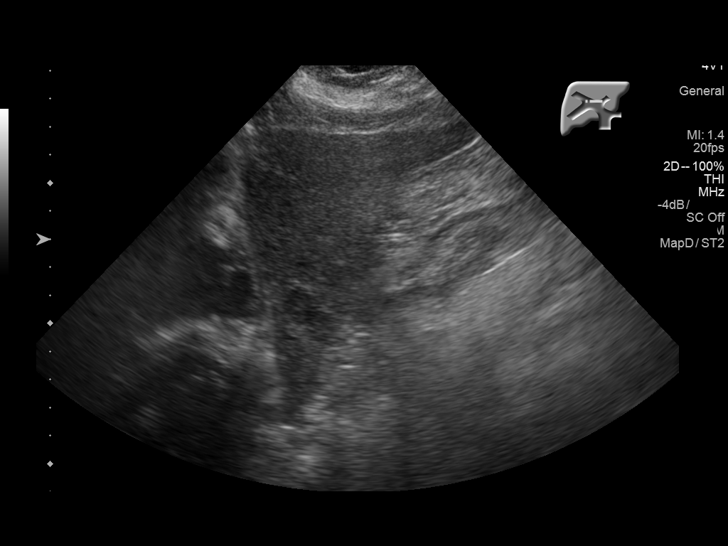
[im 43/114]
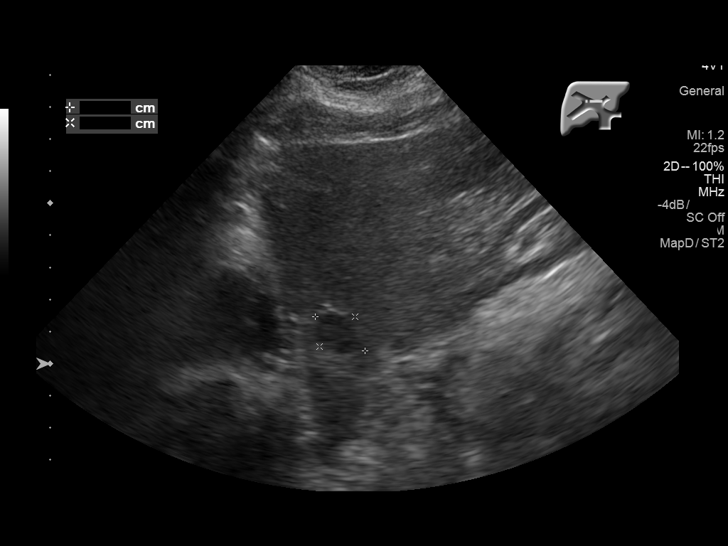
[im 52/114]
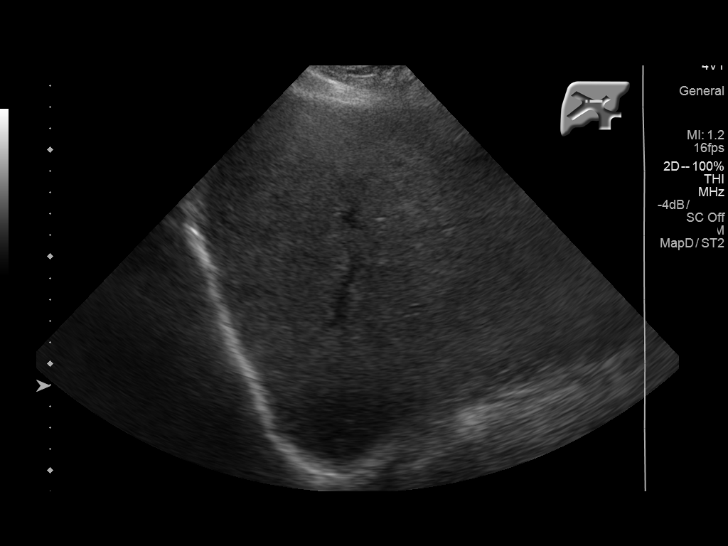
[im 62/114]
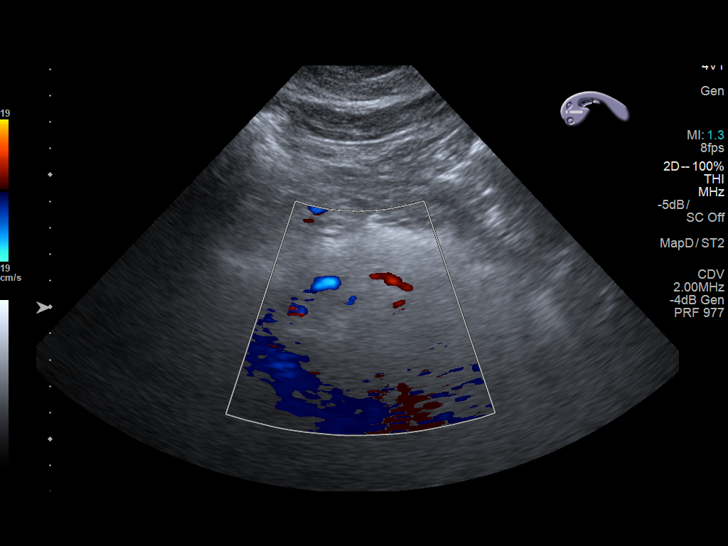
[im 71/114]
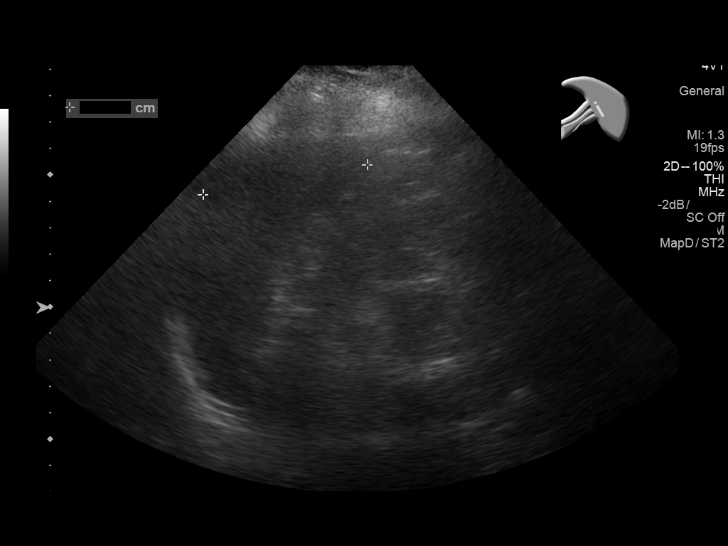
[im 76/114]
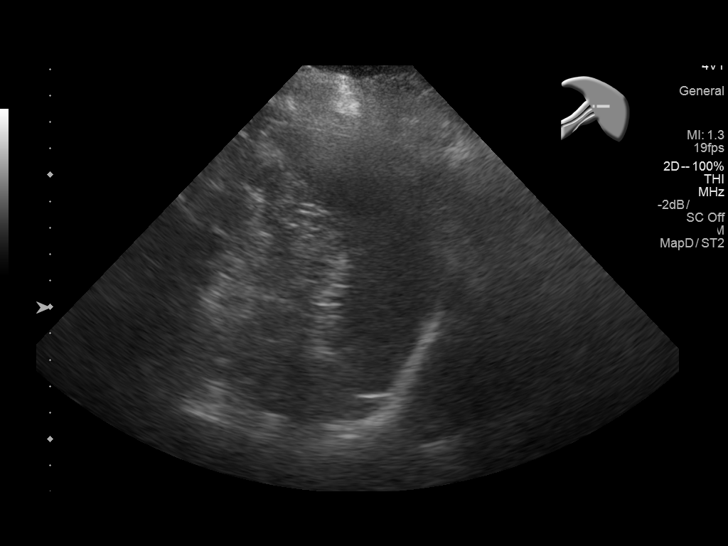
[im 85/114]
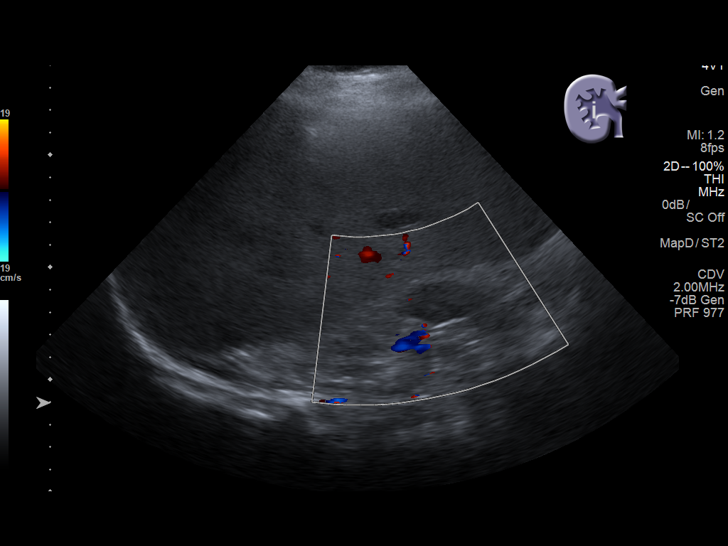
[im 95/114]
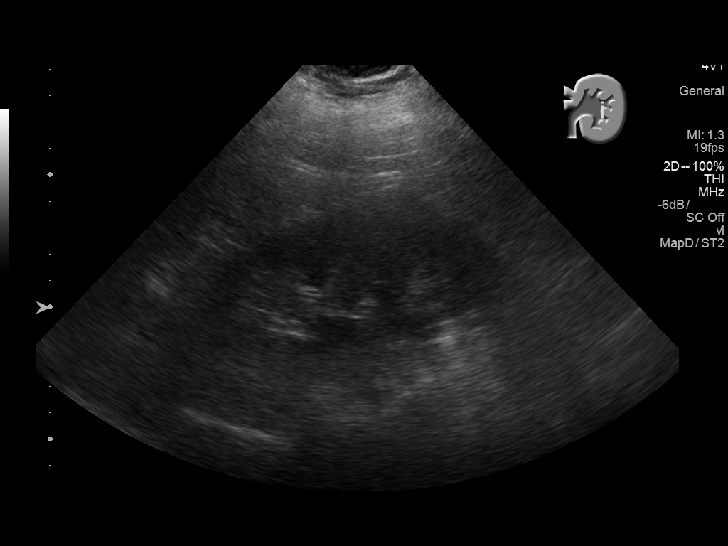
[im 104/114]
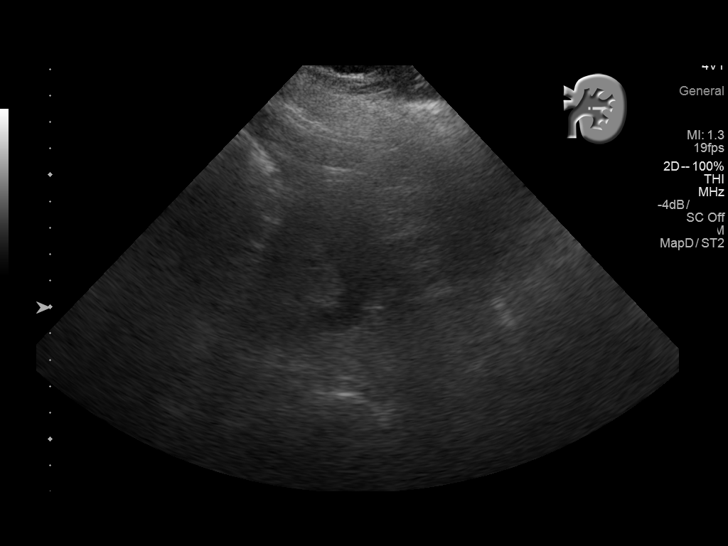
[im 114/114]
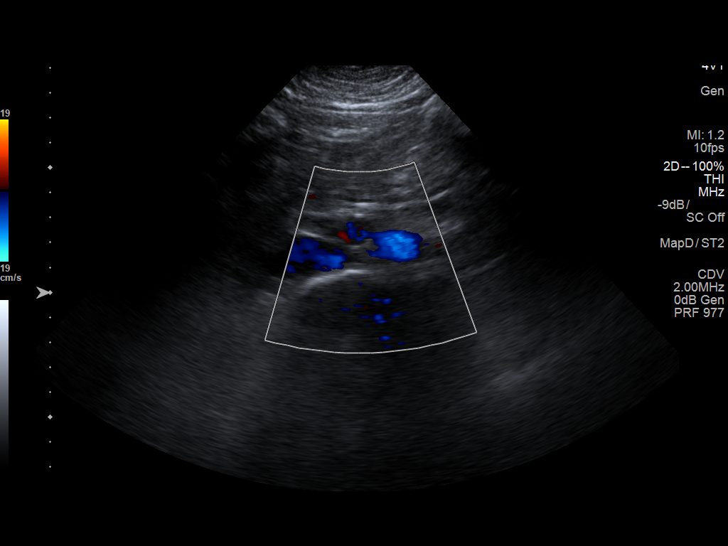

[14 of 25 positions shown; findings below may reference images not displayed]

FINDINGS: Gallbladder: No gallstones or wall thickening visualized. No
sonographic Murphy sign noted by sonographer.

Common bile duct: Diameter: 6.3 mm

Liver: 19 x 15 mm hypoechoic solid lesion left lobe of the liver.
This corresponds to an enhancing lesion in the left lobe liver on
the prior CT similar in size. Small enhancing lesion right dome of
the diaphragm not seen on today's ultrasound.

IVC: No abnormality visualized.

Pancreas: Visualized portion unremarkable.

Spleen: Size and appearance within normal limits.

Right Kidney: Length: 14.0 cm. Echogenicity within normal limits. No
mass or hydronephrosis visualized.

Left Kidney: Length: 12.4 cm. Echogenicity within normal limits. No
mass or hydronephrosis visualized.

Abdominal aorta: No aneurysm visualized.

Other findings: None.
IMPRESSION: Negative for gallstones

19 x 15 mm solid lesion left lobe liver similar to 1333, possible
hemangioma based on the CT

## 2018-02-07 ENCOUNTER — Other Ambulatory Visit: Payer: Self-pay | Admitting: Family Medicine

## 2018-02-07 DIAGNOSIS — R52 Pain, unspecified: Secondary | ICD-10-CM

## 2018-02-07 DIAGNOSIS — M069 Rheumatoid arthritis, unspecified: Secondary | ICD-10-CM

## 2018-02-07 MED ORDER — OXYCODONE-ACETAMINOPHEN 10-325 MG PO TABS: 2.0000 | ORAL_TABLET | ORAL | 0 refills | Status: DC | PRN

## 2018-02-07 NOTE — Telephone Encounter (Signed)
Copied from [place] [phone]. Topic: General - Other >> Feb 07, 2018  2:35 PM Lennox Solders wrote: Reason for CRM: pt needs  new rx percocet. [facility]  [place]

## 2018-02-07 NOTE — Telephone Encounter (Signed)
Requesting:percocet Contract:yes XQK:[id number] risk next screen 01/09/18 Last OV:05/10/17 Next OV:02/11/18 Last Refill:01/11/18 #[id number] Database:02/07/18 no concerns    Please advise

## 2018-02-11 ENCOUNTER — Encounter: Payer: Self-pay | Admitting: Family Medicine

## 2018-02-11 ENCOUNTER — Ambulatory Visit: Payer: 59 | Admitting: Family Medicine

## 2018-02-11 VITALS — BP 144/86 | HR 64 | Temp 98.8°F | Resp 16 | Ht 72.0 in | Wt 238.0 lb

## 2018-02-11 DIAGNOSIS — M05742 Rheumatoid arthritis with rheumatoid factor of left hand without organ or systems involvement: Secondary | ICD-10-CM

## 2018-02-11 DIAGNOSIS — Z79899 Other long term (current) drug therapy: Secondary | ICD-10-CM

## 2018-02-11 DIAGNOSIS — R739 Hyperglycemia, unspecified: Secondary | ICD-10-CM

## 2018-02-11 DIAGNOSIS — M05741 Rheumatoid arthritis with rheumatoid factor of right hand without organ or systems involvement: Secondary | ICD-10-CM

## 2018-02-11 DIAGNOSIS — I10 Essential (primary) hypertension: Secondary | ICD-10-CM | POA: Diagnosis not present

## 2018-02-11 DIAGNOSIS — M545 Low back pain: Secondary | ICD-10-CM

## 2018-02-11 LAB — CBC
HCT: 42.5 % (ref 36.0–46.0)
Hemoglobin: 14.4 g/dL (ref 12.0–15.0)
MCHC: 33.9 g/dL (ref 30.0–36.0)
MCV: 90.6 fl (ref 78.0–100.0)
PLATELETS: 243 10*3/uL (ref 150.0–400.0)
RBC: 4.69 Mil/uL (ref 3.87–5.11)
RDW: 14.1 % (ref 11.5–15.5)
WBC: 10.5 10*3/uL (ref 4.0–10.5)

## 2018-02-11 LAB — TSH: TSH: 1.07 u[IU]/mL (ref 0.35–4.50)

## 2018-02-11 LAB — LIPID PANEL
CHOL/HDL RATIO: 2
CHOLESTEROL: 161 mg/dL (ref 0–200)
HDL: 68.9 mg/dL (ref >39.00)
LDL Cholesterol: 71 mg/dL (ref 0–99)
NonHDL: 91.73
Triglycerides: 105 mg/dL (ref 0.0–149.0)
VLDL: 21 mg/dL (ref 0.0–40.0)

## 2018-02-11 LAB — COMPREHENSIVE METABOLIC PANEL
ALBUMIN: 4.2 g/dL (ref 3.5–5.2)
ALT: 20 U/L (ref 0–35)
AST: 16 U/L (ref 0–37)
Alkaline Phosphatase: 84 U/L (ref 39–117)
BILIRUBIN TOTAL: 0.6 mg/dL (ref 0.2–1.2)
BUN: 20 mg/dL (ref 6–23)
CALCIUM: 9.4 mg/dL (ref 8.4–10.5)
CO2: 32 mEq/L (ref 19–32)
Chloride: 98 mEq/L (ref 96–112)
Creatinine, Ser: 0.63 mg/dL (ref 0.40–1.20)
GFR: 103.01 mL/min (ref >60.00)
Glucose, Bld: 85 mg/dL (ref 70–99)
Potassium: 3.8 mEq/L (ref 3.5–5.1)
Sodium: 138 mEq/L (ref 135–145)
Total Protein: 7.6 g/dL (ref 6.0–8.3)

## 2018-02-11 LAB — HEMOGLOBIN A1C: Hgb A1c MFr Bld: 6 % (ref 4.6–6.5)

## 2018-02-11 MED ORDER — LOSARTAN POTASSIUM-HCTZ 50-12.5 MG PO TABS: 1.0000 | ORAL_TABLET | Freq: Every day | ORAL | 5 refills | Status: DC

## 2018-02-11 MED ORDER — PREDNISONE 10 MG PO TABS: 10.0000 mg | ORAL_TABLET | Freq: Every day | ORAL | 0 refills | Status: DC | PRN

## 2018-02-11 MED ORDER — GABAPENTIN 600 MG PO TABS: 600.0000 mg | ORAL_TABLET | Freq: Three times a day (TID) | ORAL | 5 refills | Status: DC

## 2018-02-11 MED ORDER — BARICITINIB 2 MG PO TABS: 2.0000 mg | ORAL_TABLET | Freq: Every day | ORAL | 0 refills | Status: DC

## 2018-02-11 MED ORDER — ALBUTEROL SULFATE HFA 108 (90 BASE) MCG/ACT IN AERS: INHALATION_SPRAY | RESPIRATORY_TRACT | 5 refills | Status: DC

## 2018-02-11 MED ORDER — DILTIAZEM HCL ER BEADS 240 MG PO CP24: 240.0000 mg | ORAL_CAPSULE | Freq: Every day | ORAL | 5 refills | Status: DC

## 2018-02-11 MED ORDER — CITALOPRAM HYDROBROMIDE 20 MG PO TABS: 20.0000 mg | ORAL_TABLET | Freq: Every day | ORAL | 5 refills | Status: DC

## 2018-02-11 NOTE — Patient Instructions (Signed)
Rheumatoid Arthritis Rheumatoid arthritis (RA) is a long-term (chronic) disease that causes inflammation in your joints. RA may start slowly. It usually affects the small joints of the hands and feet. Usually, the same joints are affected on both sides of your body. Inflammation from RA can also affect other parts of your body, including your heart, eyes, or lungs. RA is an autoimmune disease. That means that your body's defense system (immune system) mistakenly attacks healthy body tissues. There is no cure for RA, but medicines can help your symptoms and halt or slow down the progression of the disease. What are the causes? The exact cause of RA is not known. What increases the risk? This condition is more likely to develop in:  Women.  People who have a family history of RA or other autoimmune diseases.  What are the signs or symptoms? Symptoms of this condition vary from person to person. Symptoms usually start gradually. They are often worse in the morning. The first symptom may be morning stiffness that lasts longer than 30 minutes. As RA progresses, symptoms may include:  Pain, stiffness, swelling, warmth, and tenderness in joints on both sides of your body.  Loss of energy.  Loss of appetite.  Weight loss.  Low-grade fever.  Dry eyes and dry mouth.  Firm lumps (rheumatoid nodules) that grow beneath your skin in areas such as your forearm bones near your elbows and on your hands.  Changes in the appearance of joints (deformity) and loss of joint function.  Symptoms of RA often come and go. Sometimes, symptoms get worse for a period of time. These are called flares. How is this diagnosed? This condition is diagnosed based on your symptoms, medical history, and physical exam. You may have X-rays or MRI to check for the type of joint changes that are caused by RA. You may also have blood tests to look for:  Proteins (antibodies) that your immune system may make if you have RA.  They include rheumatoid factor (RF) and anti-CCP. ? When blood tests show these proteins, you are said to have "seropositive RA." ? When blood tests do not show these proteins, you may have "seronegative RA."  Inflammation in your blood.  A low number of red blood cells (anemia).  How is this treated? The goals of treatment are to relieve pain, reduce inflammation, and slow down or stop joint damage and disability. Treatment may include:  Lifestyle changes. It is important to rest, eat a healthy diet, and exercise.  Medicines. Your health care provider may adjust your medicines every 3 months until treatment goals are reached. Common medicines include: ? Pain relievers (analgesics). ? Corticosteroids and NSAIDs to reduce inflammation. ? Disease-modifying antirheumatic drugs (DMARDs) to try to slow the course of the disease. ? Biologic response modifiers to reduce inflammation and damage.  Physical therapy and occupational therapy.  Surgery, if you have severe joint damage. Joint replacement or fusing of joints may be needed.  Your health care provider will work with you to identify the best treatment option for you based on assessment of the overall disease activity in your body. Follow these instructions at home:  Take over-the-counter and prescription medicines only as told by your health care provider.  Start an exercise program as told by your health care provider.  Rest when you are having a flare.  Return to your normal activities as told by your health care provider. Ask your health care provider what activities are safe for you.  Keep all   follow-up visits as told by your health care provider. This is important. Where to find more information:  American College of Rheumatology: www.rheumatology.org  Arthritis Foundation: www.arthritis.org Contact a health care provider if:  You have a flare-up of RA symptoms.  You have a fever.  You have side effects from your  medicines. Get help right away if:  You have chest pain.  You have trouble breathing.  You quickly develop a hot, painful joint that is more severe than your usual joint aches. This information is not intended to replace advice given to you by your health care provider. Make sure you discuss any questions you have with your health care provider. Document Released: 05/05/2000 Document Revised: 10/12/2015 Document Reviewed: 02/18/2015 Elsevier Interactive Patient Education  2018 Elsevier Inc.   

## 2018-02-13 NOTE — Assessment & Plan Note (Signed)
minimize simple carbs. Increase exercise as tolerated.  

## 2018-02-13 NOTE — Assessment & Plan Note (Signed)
Encouraged DASH diet, decrease po intake and increase exercise as tolerated. Needs 7-8 hours of sleep nightly. Avoid trans fats, eat small, frequent meals every 4-5 hours with lean proteins, complex carbs and healthy fats. Minimize simple cabs

## 2018-02-13 NOTE — Assessment & Plan Note (Signed)
Lives in [place] now and has an appointment with a new rheumatologist Dr Leavy Cella on 10/2. She continues to struggle with chronic pain but manages to work full time and manage her ADLs with her current pain med protocol. UDS and contract up to date

## 2018-02-13 NOTE — Assessment & Plan Note (Signed)
Well controlled, no changes to meds. Encouraged heart healthy diet such as the DASH diet and exercise as tolerated.  °

## 2018-02-13 NOTE — Progress Notes (Signed)
Subjective:    Patient ID: Jennifer Kelly, female    DOB: 11/16/1960, 57 y.o.   MRN: [id number]  Chief Complaint  Patient presents with  . Back Pain    Here for follow up     HPI Patient is in today for follow up and she continues to enjoy her new job in [place]. She is enjoying he job and her stress is much better. Unfortunately her heamatoid arthritis and chronic back pain continue to plague her but she is able to perform her ADLs. She is struggling with increased numbness in he right arm and right leg. No fall or trauma. No incontinence. Denies CP/palp/SOB/HA/congestion/fevers/GI or GU c/o. Taking meds as prescribed  Past Medical History:  Diagnosis Date  . Acute bronchitis 01/05/2014  . Arthritis   . Chicken pox as a child  . Dermatitis 09/09/2014  . Ganglion cyst of left foot 09/09/2014  . H/O mumps   . H/O tobacco use, presenting hazards to health 10/08/2009   Qualifier: Diagnosis of  By: Linda Hedges MD, Heinz Knuckles  Last cigarette 02/16/2016   . History of chicken pox   . Hyperglycemia 11/30/2015  . Hypertension   . Morbid obesity ([place]) 08/31/2009   Qualifier: Diagnosis of  By: Linda Hedges MD, Heinz Knuckles  BMI 34.4(Sept '13) BMI 34 (March '15)   . Mumps as a child  . Osteopenia 01/02/2017  . Osteoporosis 11/20/2016  . Psoriatic arthritis ([place])   . Rheumatoid arthritis ([place])   . Shingles 2014    Past Surgical History:  Procedure Laterality Date  . BACK SURGERY  2003  . NECK SURGERY  02/17/2016   C4, C5 & C6  . rotater cuff  20 yrs ago   left shoulder  . TOOTH EXTRACTION  05/2012  . WISDOM TOOTH EXTRACTION  57 yrs old    Family History  Problem Relation Age of Onset  . Parkinson's disease Mother   . Heart disease Mother   . Hypertension Mother   . Diabetes Father        type 2  . Hypertension Father   . Stroke Father   . Vascular Disease Brother   . Hypertension Brother   . Diabetes Brother   . Heart attack Paternal Uncle     Social History   Socioeconomic History  .  Marital status: Divorced    Spouse name: Not on file  . Number of children: 0  . Years of education: Not on file  . Highest education level: Not on file  Occupational History  . Occupation: help Therapist, sports  Social Needs  . Financial resource strain: Not on file  . Food insecurity:    Worry: Not on file    Inability: Not on file  . Transportation needs:    Medical: Not on file    Non-medical: Not on file  Tobacco Use  . Smoking status: Former Smoker    Start date: 11/30/2015  . Smokeless tobacco: Never Used  Substance and Sexual Activity  . Alcohol use: Yes    Comment: occ wine  . Drug use: No  . Sexual activity: Never    Comment: no dietary restrictions. lives alone  Lifestyle  . Physical activity:    Days per week: Not on file    Minutes per session: Not on file  . Stress: Not on file  Relationships  . Social connections:    Talks on phone: Not on file    Gets together: Not on file  Attends religious service: Not on file    Active member of club or organization: Not on file    Attends meetings of clubs or organizations: Not on file    Relationship status: Not on file  . Intimate partner violence:    Fear of current or ex partner: Not on file    Emotionally abused: Not on file    Physically abused: Not on file    Forced sexual activity: Not on file  Other Topics Concern  . Not on file  Social History Narrative  . Not on file    Outpatient Medications Prior to Visit  Medication Sig Dispense Refill  . fentaNYL (DURAGESIC) 75 MCG/HR Place 1 patch (75 mcg total) onto the skin every 3 (three) days. 10 patch 0  . ibuprofen (ADVIL,MOTRIN) 800 MG tablet     . oxyCODONE-acetaminophen (PERCOCET) 10-325 MG tablet Take 2 tablets by mouth every 4 (four) hours as needed for pain. 150 tablet 0  . predniSONE (DELTASONE) 5 MG tablet Take 20 mg by mouth daily.   0  . venlafaxine XR (EFFEXOR-XR) 75 MG 24 hr capsule TAKE 1 CAPSULE(75 MG) BY MOUTH DAILY WITH BREAKFAST 90  capsule 1  . albuterol (VENTOLIN HFA) 108 (90 Base) MCG/ACT inhaler inhale 2 puffs by mouth every 6 hours if needed for wheezing 18 g 8  . citalopram (CELEXA) 20 MG tablet TAKE 1 TABLET BY MOUTH DAILY 30 tablet 0  . diltiazem (TIAZAC) 240 MG 24 hr capsule Take 1 capsule (240 mg total) by mouth daily. 90 capsule 1  . gabapentin (NEURONTIN) 600 MG tablet Take 1 tablet (600 mg total) by mouth 3 (three) times daily. 90 tablet 2  . losartan-hydrochlorothiazide (HYZAAR) 50-12.5 MG tablet TAKE 1 TABLET BY MOUTH DAILY 30 tablet 0   No facility-administered medications prior to visit.     Allergies  Allergen Reactions  . Simponi [Golimumab] Rash    States that it was systemic    Review of Systems  Constitutional: Negative for fever and malaise/fatigue.  HENT: Negative for congestion.   Eyes: Negative for blurred vision.  Respiratory: Negative for shortness of breath.   Cardiovascular: Negative for chest pain, palpitations and leg swelling.  Gastrointestinal: Negative for abdominal pain, blood in stool and nausea.  Genitourinary: Negative for dysuria and frequency.  Musculoskeletal: Positive for back pain, joint pain and myalgias. Negative for falls.  Skin: Negative for rash.  Neurological: Positive for sensory change. Negative for dizziness, loss of consciousness and headaches.  Endo/Heme/Allergies: Negative for environmental allergies.  Psychiatric/Behavioral: Negative for depression. The patient is not nervous/anxious.        Objective:    Physical Exam  Constitutional: She is oriented to person, place, and time. She appears well-developed and well-nourished. No distress.  HENT:  Head: Normocephalic and atraumatic.  Nose: Nose normal.  Eyes: Right eye exhibits no discharge. Left eye exhibits no discharge.  Neck: Normal range of motion. Neck supple.  Cardiovascular: Normal rate and regular rhythm.  No murmur heard. Pulmonary/Chest: Effort normal and breath sounds normal.    Abdominal: Soft. Bowel sounds are normal. There is no tenderness.  Musculoskeletal: She exhibits no edema.  Neurological: She is alert and oriented to person, place, and time.  Skin: Skin is warm and dry.  Psychiatric: She has a normal mood and affect.  Nursing note and vitals reviewed.   BP (!) 144/86 (BP Location: Left Arm, Patient Position: Sitting, Cuff Size: Small)   Pulse 64   Temp 98.8 F (37.1  C) (Oral)   Resp 16   Ht 6' (1.829 m)   Wt 238 lb (108 kg)   SpO2 95%   BMI 32.28 kg/m  Wt Readings from Last 3 Encounters:  02/11/18 238 lb (108 kg)  08/09/17 245 lb 12.8 oz (111.5 kg)  05/10/17 251 lb 3.2 oz (113.9 kg)     Lab Results  Component Value Date   WBC 10.5 02/11/2018   HGB 14.4 02/11/2018   HCT 42.5 02/11/2018   PLT 243.0 02/11/2018   GLUCOSE 85 02/11/2018   CHOL 161 02/11/2018   TRIG 105.0 02/11/2018   HDL 68.90 02/11/2018   LDLCALC 71 02/11/2018   ALT 20 02/11/2018   AST 16 02/11/2018   NA 138 02/11/2018   K 3.8 02/11/2018   CL 98 02/11/2018   CREATININE 0.63 02/11/2018   BUN 20 02/11/2018   CO2 32 02/11/2018   TSH 1.07 02/11/2018   HGBA1C 6.0 02/11/2018    Lab Results  Component Value Date   TSH 1.07 02/11/2018   Lab Results  Component Value Date   WBC 10.5 02/11/2018   HGB 14.4 02/11/2018   HCT 42.5 02/11/2018   MCV 90.6 02/11/2018   PLT 243.0 02/11/2018   Lab Results  Component Value Date   NA 138 02/11/2018   K 3.8 02/11/2018   CO2 32 02/11/2018   GLUCOSE 85 02/11/2018   BUN 20 02/11/2018   CREATININE 0.63 02/11/2018   BILITOT 0.6 02/11/2018   ALKPHOS 84 02/11/2018   AST 16 02/11/2018   ALT 20 02/11/2018   PROT 7.6 02/11/2018   ALBUMIN 4.2 02/11/2018   CALCIUM 9.4 02/11/2018   ANIONGAP 8 11/26/2014   GFR 103.01 02/11/2018   Lab Results  Component Value Date   CHOL 161 02/11/2018   Lab Results  Component Value Date   HDL 68.90 02/11/2018   Lab Results  Component Value Date   LDLCALC 71 02/11/2018   Lab Results   Component Value Date   TRIG 105.0 02/11/2018   Lab Results  Component Value Date   CHOLHDL 2 02/11/2018   Lab Results  Component Value Date   HGBA1C 6.0 02/11/2018       Assessment & Plan:   Problem List Items Addressed This Visit    Morbid obesity ([place])    Encouraged DASH diet, decrease po intake and increase exercise as tolerated. Needs 7-8 hours of sleep nightly. Avoid trans fats, eat small, frequent meals every 4-5 hours with lean proteins, complex carbs and healthy fats. Minimize simple cabs      Rheumatoid arthritis [facility])    Lives in [place] now and has an appointment with a new rheumatologist Dr Leavy Cella on 10/2. She continues to struggle with chronic pain but manages to work full time and manage her ADLs with her current pain med protocol. UDS and contract up to date      Relevant Medications   Baricitinib (OLUMIANT) 2 MG TABS   predniSONE (DELTASONE) 10 MG tablet   LOW BACK PAIN   Relevant Medications   Baricitinib (OLUMIANT) 2 MG TABS   predniSONE (DELTASONE) 10 MG tablet   Other Relevant Orders   TSH (Completed)   Hypertension    Well controlled, no changes to meds. Encouraged heart healthy diet such as the DASH diet and exercise as tolerated.       Relevant Medications   diltiazem (TIAZAC) 240 MG 24 hr capsule   losartan-hydrochlorothiazide (HYZAAR) 50-12.5 MG tablet   Other Relevant Orders  CBC (Completed)   Comprehensive metabolic panel (Completed)   Lipid panel (Completed)   TSH (Completed)   Hyperglycemia    minimize simple carbs. Increase exercise as tolerated.       Relevant Orders   Hemoglobin A1c (Completed)   Lipid panel (Completed)   TSH (Completed)    Other Visit Diagnoses    Encounter for long-term (current) use of medications    -  Primary   Relevant Orders   Pain Mgmt, Profile 8 w/Conf, U      I have changed Jennifer Kelly's citalopram. I am also having her start on Baricitinib and predniSONE. Additionally, I am having  her maintain her ibuprofen, predniSONE, venlafaxine XR, fentaNYL, oxyCODONE-acetaminophen, gabapentin, albuterol, diltiazem, and losartan-hydrochlorothiazide.  Meds ordered this encounter  Medications  . Baricitinib (OLUMIANT) 2 MG TABS    Sig: Take 2 mg by mouth daily.    Dispense:  30 tablet    Refill:  0  . predniSONE (DELTASONE) 10 MG tablet    Sig: Take 1 tablet (10 mg total) by mouth daily as needed.    Dispense:  30 tablet    Refill:  0  . gabapentin (NEURONTIN) 600 MG tablet    Sig: Take 1 tablet (600 mg total) by mouth 3 (three) times daily.    Dispense:  30 tablet    Refill:  5  . albuterol (VENTOLIN HFA) 108 (90 Base) MCG/ACT inhaler    Sig: inhale 2 puffs by mouth every 6 hours if needed for wheezing    Dispense:  18 g    Refill:  5  . diltiazem (TIAZAC) 240 MG 24 hr capsule    Sig: Take 1 capsule (240 mg total) by mouth daily.    Dispense:  30 capsule    Refill:  5  . losartan-hydrochlorothiazide (HYZAAR) 50-12.5 MG tablet    Sig: Take 1 tablet by mouth daily.    Dispense:  30 tablet    Refill:  5  . citalopram (CELEXA) 20 MG tablet    Sig: Take 1 tablet (20 mg total) by mouth daily.    Dispense:  30 tablet    Refill:  5     Penni Homans, MD

## 2018-02-14 LAB — PAIN MGMT, PROFILE 8 W/CONF, U
6 ACETYLMORPHINE: NEGATIVE ng/mL (ref <10)
AMPHETAMINES: NEGATIVE ng/mL (ref <500)
Alcohol Metabolites: POSITIVE ng/mL — AB (ref <500)
Benzodiazepines: NEGATIVE ng/mL (ref <100)
Buprenorphine, Urine: NEGATIVE ng/mL (ref <5)
CREATININE: 86.3 mg/dL
Cocaine Metabolite: NEGATIVE ng/mL (ref <150)
Codeine: NEGATIVE ng/mL (ref <50)
ETHYL GLUCURONIDE (ETG): 18170 ng/mL — AB (ref <500)
ETHYL SULFATE (ETS): 3067 ng/mL — AB (ref <100)
HYDROMORPHONE: NEGATIVE ng/mL (ref <50)
Hydrocodone: NEGATIVE ng/mL (ref <50)
MDMA: NEGATIVE ng/mL (ref <500)
MORPHINE: NEGATIVE ng/mL (ref <50)
Marijuana Metabolite: NEGATIVE ng/mL (ref <20)
Norhydrocodone: NEGATIVE ng/mL (ref <50)
Noroxycodone: 4692 ng/mL — ABNORMAL HIGH (ref <50)
OPIATES: NEGATIVE ng/mL (ref <100)
OXYCODONE: 8478 ng/mL — AB (ref <50)
OXYMORPHONE: 6846 ng/mL — AB (ref <50)
Oxidant: NEGATIVE ug/mL (ref <200)
Oxycodone: POSITIVE ng/mL — AB (ref <100)
pH: 6.72 (ref 4.5–9.0)

## 2018-02-27 ENCOUNTER — Telehealth: Payer: Self-pay | Admitting: Family Medicine

## 2018-02-27 DIAGNOSIS — M05742 Rheumatoid arthritis with rheumatoid factor of left hand without organ or systems involvement: Principal | ICD-10-CM

## 2018-02-27 DIAGNOSIS — M549 Dorsalgia, unspecified: Secondary | ICD-10-CM

## 2018-02-27 DIAGNOSIS — M05741 Rheumatoid arthritis with rheumatoid factor of right hand without organ or systems involvement: Secondary | ICD-10-CM

## 2018-02-27 NOTE — Telephone Encounter (Signed)
Copied from [place] [id number]. Topic: Quick Communication - Rx Refill/Question >> Feb 27, 2018 11:10 AM Yvette Rack wrote: Medication: fentaNYL ([place]) 75 MCG/HR  Has the patient contacted their pharmacy? no  Preferred Pharmacy (with phone number or street name): [facility] #[id number] [facility], [place] AT [address] [phone] (Phone) [phone] (Fax)  Agent: Please be advised that RX refills may take up to 3 business days. We ask that you follow-up with your pharmacy.

## 2018-02-27 NOTE — Telephone Encounter (Signed)
Requested medication (s) are due for refill today: yes  Requested medication (s) are on the active medication list: yes  Last refill:  01/26/18  Future visit scheduled: yes  Notes to clinic:  Needs UDS and controlled substance   Requested Prescriptions  Pending Prescriptions Disp Refills   fentaNYL (DURAGESIC) 75 MCG/HR 10 patch 0    Sig: Place 1 patch (75 mcg total) onto the skin every 3 (three) days.     Not Delegated - Analgesics:  Opioid Agonists Failed - 02/27/2018 11:14 AM      Failed - This refill cannot be delegated      Failed - Urine Drug Screen completed in last 360 days.      Passed - Valid encounter within last 6 months    Recent Outpatient Visits          2 weeks ago Encounter for long-term (current) use of medications   [facility] at [place], MD   6 months ago Back pain, unspecified back location, unspecified back pain laterality, unspecified chronicity   Archivist at [place], MD   9 months ago Depression with anxiety   Archivist at [place], MD   1 year ago Routine health maintenance   [place] [facility] [place], MD   1 year ago Osteoporosis, unspecified osteoporosis type, unspecified pathological fracture presence   Archivist at [place], MD      Future Appointments            In 6 months Mosie Lukes, MD [place] at [place]

## 2018-02-28 ENCOUNTER — Other Ambulatory Visit: Payer: Self-pay | Admitting: Family Medicine

## 2018-02-28 DIAGNOSIS — M05741 Rheumatoid arthritis with rheumatoid factor of right hand without organ or systems involvement: Secondary | ICD-10-CM

## 2018-02-28 DIAGNOSIS — M549 Dorsalgia, unspecified: Secondary | ICD-10-CM

## 2018-02-28 DIAGNOSIS — M05742 Rheumatoid arthritis with rheumatoid factor of left hand without organ or systems involvement: Principal | ICD-10-CM

## 2018-02-28 MED ORDER — FENTANYL 75 MCG/HR TD PT72: 75.0000 ug | MEDICATED_PATCH | TRANSDERMAL | 0 refills | Status: DC

## 2018-02-28 NOTE — Telephone Encounter (Signed)
done

## 2018-02-28 NOTE — Telephone Encounter (Signed)
Pt called stating that this medication was sent to wrong pharmacy. Pt would like to have it sent to the correct pharmacy below as soon as possible.   [facility] #[id number] - [place], [address] AT [place] [phone] (Phone) [phone] (Fax)

## 2018-02-28 NOTE — Telephone Encounter (Signed)
Pt is requesting refill on Fentanyl.   Last OV: 08/09/2017, appt 09/02/2018 Last Fill: 01/26/2018 #10 and 0RF UDS: 02/11/2018 Moderate risk

## 2018-02-28 NOTE — Telephone Encounter (Signed)
Will route to office for final disposition; prescription sent to wrong pharmacy; please resend prescription to correct pharmacy; see telephone encounter dated 02/27/18; also see CRM # [id number].

## 2018-03-01 NOTE — Telephone Encounter (Signed)
Patient is calling regarding her fentaNYL ([place]) 41 MCG/HR [[id number]]    [facility].  She is requesting if the fentaNYL ([place]) 75 MCG/HR [[id number]]  Can be sent to   [place], [place].  [phone]

## 2018-03-04 ENCOUNTER — Other Ambulatory Visit: Payer: Self-pay | Admitting: Family Medicine

## 2018-03-04 MED ORDER — DURAGESIC-100 100 MCG/HR TD PT72: 100.0000 ug | MEDICATED_PATCH | TRANSDERMAL | 0 refills | Status: DC

## 2018-03-04 NOTE — Telephone Encounter (Signed)
Routed refill request to PCP

## 2018-03-04 NOTE — Telephone Encounter (Signed)
Please cancel request to change pharmacy. Pt received call from [facility] and they have filled the RX.   [facility] #[id number] - [place], [place] [address] AT [place] [phone] (Phone) [phone] (Fax)

## 2018-03-04 NOTE — Telephone Encounter (Signed)
PEC called requesting resent to pharmacy below--pharmacy refill sent to is out of medication

## 2018-03-05 ENCOUNTER — Other Ambulatory Visit: Payer: Self-pay

## 2018-03-05 DIAGNOSIS — R52 Pain, unspecified: Secondary | ICD-10-CM

## 2018-03-05 DIAGNOSIS — M069 Rheumatoid arthritis, unspecified: Secondary | ICD-10-CM

## 2018-03-05 MED ORDER — OXYCODONE-ACETAMINOPHEN 10-325 MG PO TABS: 2.0000 | ORAL_TABLET | ORAL | 0 refills | Status: DC | PRN

## 2018-03-05 NOTE — Telephone Encounter (Signed)
Author phoned pt. to follow-up on fentanyl patch request from 10/13 [facility] phone call as notated via fax. Pt. Reviewed what had happened, and clarified with author her preferred pharmacies. Sticky note made in chart documenting as such. Pt. States she will be out of the percocet by the end of the week. Routed to Dr. Charlett Blake to approve.  Requesting: percocet 10/325mg  (2 tabs) every 4hr prn Contract: 2019 UDS: 02/11/18 Last OV: 02/11/18 Next Ov: 09/02/2018 Last refill: 02/07/18, #150, 0RF Database: no recent discrepancies noted  Please advise.

## 2018-03-27 ENCOUNTER — Telehealth: Payer: Self-pay | Admitting: Family Medicine

## 2018-03-27 ENCOUNTER — Other Ambulatory Visit: Payer: Self-pay | Admitting: Family Medicine

## 2018-03-27 MED ORDER — DURAGESIC-100 100 MCG/HR TD PT72: 100.0000 ug | MEDICATED_PATCH | TRANSDERMAL | 0 refills | Status: DC

## 2018-03-27 NOTE — Telephone Encounter (Signed)
Please advise 

## 2018-03-27 NOTE — Telephone Encounter (Signed)
Please seen what ARB her pharmacy has in stock then we can change. I will refill the Fentanyl

## 2018-03-27 NOTE — Telephone Encounter (Signed)
Copied from [place] [id number] [phone]. Topic: Quick Communication - See Telephone Encounter >> Mar 27, 2018 11:23 AM Ivar Drape wrote: CRM for notification. See Telephone encounter for: 03/27/18. Patient would like a refill on her Fentanyl (Duragesic) 51mcg/hr medication and have it sent to the [facility] on [address] in [place], [place].   She would also like a refill on her losartan-hydrochlorothiazide (HYZAAR) 50-12.5 MG tablet medication, but her preferred pharmacy does not make it anymore.  So she will need something similar to this drug and send it to her preferred pharmacy [facility] on [address] in [place], [place].

## 2018-03-28 ENCOUNTER — Telehealth: Payer: Self-pay

## 2018-03-28 NOTE — Telephone Encounter (Signed)
Patient is calling regarding fentaNYL ([place]) 75 MCG/HR [[id number]] DISCONTINUED  She states that the 100mg  is not the correct medication.  And she is need of the generic medication. She has been taking the 27mcg all along with no problems.  Patient is not aware how the Fentanyl was increased 170mcg.  Is it time for the PA for 75mg  done again? It is done about 2x a year.  Please advise [phone]

## 2018-03-28 NOTE — Telephone Encounter (Signed)
Per PA- her plan does not cover any amount of fentanyl (Duragesic).

## 2018-03-28 NOTE — Telephone Encounter (Signed)
PA denied. Medication is no longer a covered benefit and excluded from plan.

## 2018-03-28 NOTE — Telephone Encounter (Signed)
Copied from [place] [id number]. Topic: Quick Communication - See Telephone Encounter >> Mar 28, 2018  5:16 PM Blase Mess A wrote: CRM for notification. See Telephone encounter for: 03/28/18. Patient is calling regarding her losartan-hydrochlorothiazide (HYZAAR) 50-12.5 MG tablet [[id number]]  the medication has been recalled. And the pharmacy does not know when the drug will return. Patient would like want else she can take? Patient has been off of the losartan-hydrochlorothiazide (HYZAAR) 50-12.5 MG tablet [[id number]] for 5 days. Please advise [phone]

## 2018-03-28 NOTE — Telephone Encounter (Signed)
PA initiated via Covermymeds; KEY: [id number]. Awaiting determination.

## 2018-03-28 NOTE — Telephone Encounter (Signed)
I had sent a note asking that we call her pharmacy and see which arb they have in stock so we can switch to a diffeent one.

## 2018-03-28 NOTE — Telephone Encounter (Signed)
Please let patient know her insurance has declined to pay for her med. She can shop around and pay cash or I could send her to pain management and maybe they could get something else long acting approved?

## 2018-03-29 ENCOUNTER — Telehealth: Payer: Self-pay

## 2018-03-29 ENCOUNTER — Telehealth: Payer: Self-pay | Admitting: Medical

## 2018-03-29 DIAGNOSIS — I10 Essential (primary) hypertension: Secondary | ICD-10-CM

## 2018-03-29 DIAGNOSIS — M549 Dorsalgia, unspecified: Secondary | ICD-10-CM

## 2018-03-29 MED ORDER — FENTANYL 75 MCG/HR TD PT72: MEDICATED_PATCH | TRANSDERMAL | 0 refills | Status: DC

## 2018-03-29 NOTE — Telephone Encounter (Signed)
PA initiated via Covermymeds; KEY: [id number]. Awaiting determination.

## 2018-03-29 NOTE — Telephone Encounter (Signed)
rx fentanyl 75 mcg patch sent. Duragesic not covered. Dr. Charlett Blake wrote yesterday and that was canceled. Rod Holler talked with insurance and pt.

## 2018-04-01 ENCOUNTER — Other Ambulatory Visit: Payer: Self-pay | Admitting: Medical

## 2018-04-01 ENCOUNTER — Other Ambulatory Visit: Payer: Self-pay | Admitting: Family Medicine

## 2018-04-01 DIAGNOSIS — M069 Rheumatoid arthritis, unspecified: Secondary | ICD-10-CM

## 2018-04-01 DIAGNOSIS — M549 Dorsalgia, unspecified: Secondary | ICD-10-CM

## 2018-04-01 DIAGNOSIS — R52 Pain, unspecified: Secondary | ICD-10-CM

## 2018-04-01 MED ORDER — FENTANYL 75 MCG/HR TD PT72: 75.0000 ug | MEDICATED_PATCH | TRANSDERMAL | 0 refills | Status: DC

## 2018-04-01 MED ORDER — LOSARTAN POTASSIUM 50 MG PO TABS: 50.0000 mg | ORAL_TABLET | Freq: Every day | ORAL | 1 refills | Status: DC

## 2018-04-01 MED ORDER — OXYCODONE-ACETAMINOPHEN 10-325 MG PO TABS: 2.0000 | ORAL_TABLET | ORAL | 0 refills | Status: DC | PRN

## 2018-04-01 MED ORDER — HYDROCHLOROTHIAZIDE 12.5 MG PO CAPS: 12.5000 mg | ORAL_CAPSULE | Freq: Every day | ORAL | 1 refills | Status: DC

## 2018-04-01 NOTE — Telephone Encounter (Signed)
PA denied for #15 patches per month. However PA has been approved for #10 per month through 06/27/2018.

## 2018-04-01 NOTE — Telephone Encounter (Signed)
I filled the presciption as presented to me by Marin Roberts on Friday. I need to someone to call over and confirm that Dr. Charlett Blake rx and my rx have not been filled. I can write it for every 3rd day when I get verified those prescription have been taken out/shredded by pharmacy.   If you could pre-load the losartan and hctz as separate prescription will fill.

## 2018-04-01 NOTE — Telephone Encounter (Signed)
Patient is calling back and states that she will contact the pharmacy and make sure the other prescriptions are canceled. She also states that the Fentanyl 1 every 3rd day is going to need a prior authorization and she was told by her insurance company if they put emergency over ride then it can be done right away and will not have to wait 1-2 days. Please advise.

## 2018-04-01 NOTE — Telephone Encounter (Signed)
[facility], receipt confirmed. Author then phoned pt. to notify, and pt. Appreciate, wanting Dr. Charlett Blake to know that "this was not her fault", and author reassured pt. that she was not to blame for lapsed PA, or medication confusion. Chief Strategy Officer apologized and agreed to send in request for percocet, due 11/15 to [facility], for Dr. Charlett Blake to review and approve.

## 2018-04-01 NOTE — Telephone Encounter (Signed)
Just not sure what her pharmacy actually has. Can switch to Irbesartanhct 150/12.5 mg tab, 1 tab po daily if they have it. Disp #30 with 2 f or Disp #90 with o refills at patient discretion.

## 2018-04-01 NOTE — Telephone Encounter (Signed)
I refilled pt fentanyl patch. Wrote for one patch every 3rd day. Asked RN Raynelle Dick  to make sure prior rx patches that Dr. Charlett Blake and I rx'd canceled.

## 2018-04-01 NOTE — Telephone Encounter (Signed)
Resolved. See other telephone encounters.

## 2018-04-01 NOTE — Telephone Encounter (Signed)
Routed to Dr. Charlett Blake to confirm fentanyl order.

## 2018-04-01 NOTE — Telephone Encounter (Signed)
Pt. Calling, upset over re-occurring medication issues. Per PEC agent, pt. stated she only takes fentanyl every 3 days, not every other day as written in order placed by PA on 11/8, so she would only need 10 patches anyway. Pt. Will need order changed and PA re-submitted.   Also, pt. had not heard update on losartan and HCTZ being changed to separate rx. [facility] that [facility] both the losartan 50mg  and HCTZ 12.5mg . Pharmacist confirmed that hyzaar is onback order, and both dosages were available. Orders placed for losartan and HCTZ. New fentanyl sig. Routed to PA to approve in PCP absence. Once ordered, PA will need to be initiated, routed to B[place], CMA, to follow-up.

## 2018-04-01 NOTE — Addendum Note (Signed)
Addended by: Raynelle Dick R on: 04/01/2018 04:30 PM   Modules accepted: Orders

## 2018-04-01 NOTE — Telephone Encounter (Signed)
I sent the Oxycodone to [facility]. I think that was correct. Thanks

## 2018-04-01 NOTE — Addendum Note (Signed)
Addended by: Raynelle Dick R on: 04/01/2018 10:24 AM   Modules accepted: Orders

## 2018-04-01 NOTE — Telephone Encounter (Signed)
I will not be able to do this PA while I am working in the lab.

## 2018-04-01 NOTE — Telephone Encounter (Signed)
Just saw this note, author sent in losartan and HCTZ separately. Do you want to d/c the losartan and have her on alternative? There are multiple notes on her medications.

## 2018-04-01 NOTE — Telephone Encounter (Signed)
[facility] to confirm fentanyl rx sent by Percell Miller, [place] had been cancelled, [facility] confirmed. Chief Strategy Officer then phoned insurance company to get emergency PA for fentanyl 75mg  every 3 days. Author spoke with [place], who stated that the PA for 10 patches for 30days had already been approved, and there was no need to re-request PA even though order is slightly different from the original. Chief Strategy Officer printed out new rx for fentanyl for PA to sign. [facility] once Pryor Curia obtains signature.

## 2018-04-02 NOTE — Telephone Encounter (Signed)
Pharmacy said they had both losartan and HCTZ available, already sent in. OK to remain on that?

## 2018-04-02 NOTE — Telephone Encounter (Signed)
Yes that is fine with me just could not follow the thread well. As long as willing to take I am happy with that choice

## 2018-04-29 ENCOUNTER — Other Ambulatory Visit: Payer: Self-pay | Admitting: Family Medicine

## 2018-04-29 DIAGNOSIS — M069 Rheumatoid arthritis, unspecified: Secondary | ICD-10-CM

## 2018-04-29 DIAGNOSIS — R52 Pain, unspecified: Secondary | ICD-10-CM

## 2018-04-29 NOTE — Telephone Encounter (Signed)
Copied from [place] [id number]. Topic: General - Other >> Apr 29, 2018 11:01 AM Janace Aris A wrote: Medication: oxyCODONE-acetaminophen (PERCOCET) 10-325 MG tablet   Has the patient contacted their pharmacy? Yes, because it is a controlled substance, pt was instructed to call in RX  Preferred Pharmacy (with phone number or street name): [facility] #[id number] [facility], [place] AT [place]  [phone] (Phone) [phone] (Fax)    Agent: Please be advised that RX refills may take up to 3 business days. We ask that you follow-up with your pharmacy.

## 2018-04-30 MED ORDER — OXYCODONE-ACETAMINOPHEN 10-325 MG PO TABS: 2.0000 | ORAL_TABLET | ORAL | 0 refills | Status: DC | PRN

## 2018-05-01 ENCOUNTER — Other Ambulatory Visit: Payer: Self-pay

## 2018-05-01 DIAGNOSIS — M069 Rheumatoid arthritis, unspecified: Secondary | ICD-10-CM

## 2018-05-01 DIAGNOSIS — R52 Pain, unspecified: Secondary | ICD-10-CM

## 2018-05-01 DIAGNOSIS — M549 Dorsalgia, unspecified: Secondary | ICD-10-CM

## 2018-05-01 MED ORDER — OXYCODONE-ACETAMINOPHEN 10-325 MG PO TABS: 2.0000 | ORAL_TABLET | ORAL | 0 refills | Status: DC | PRN

## 2018-05-01 MED ORDER — FENTANYL 75 MCG/HR TD PT72: 75.0000 ug | MEDICATED_PATCH | TRANSDERMAL | 0 refills | Status: DC

## 2018-05-01 NOTE — Telephone Encounter (Signed)
Patient called asking to speak to emily to discuss medication refills. Explained to her that emily was out of the office and I could help her. She explained to me that her medications are all out of sorts and sent to the wrong pharmacy yesterday. I looked in her chart and only the oxycodone had been sent in the pharmacy did not send [place] a request for fentanyl patches. She is requesting her [facility]anyl [facility] [place] and her oxycodone to be sent to [facility] in [place]. Oxycodone was refilled yesterday and sent to wrong pharmacy. Please refill both [facility] and [facility] She states it is very important for them to go to the two different pharmacies.   Ibuprofen was also sent to pcp in error by pharmacy. Patient does not need ibuprofen.   Dr. Charlett Blake PCP out of office and patient would like this addressed immediately. I will call pharmacy to cancel the oxycodone that was [facility] in [facility]. Pended Rxs.

## 2018-05-01 NOTE — Telephone Encounter (Signed)
Reviewed [place] and she is taking both of these medications 04/02/2018  2   04/01/2018  Oxycodone-Acetaminophen 10-325  [address]  [id number]  Cos (8734)  0/0 173.08 MME Other  [place]  04/01/2018  2   04/01/2018  Fentanyl 75 Mcg/hr Patch  10.00 [address]  [id number]  Wal (2215)  0/0 180.00 MME Comm Ins  [place]  03/08/2018  2   03/05/2018  Oxycodone-Acetaminophen 10-325  150.00 [address]  [id number]  Cos (8734)  0/0 132.35 MME Other  [place]  03/04/2018  2   02/28/2018  Fentanyl 75 Mcg/hr Patch  10.00 [address]  [id number]  Wal (2215)  0/0 180.00 MME Comm Ins  [place]  02/08/2018  2   02/07/2018  Oxycodone-Acetaminophen 10-325  150.00 [address]  [id number]  Cos (8734)  0/0 125.00 MME Other  [place]  01/26/2018  2   01/26/2018  Fentanyl 75 Mcg/hr Patch  10.00 [address]  [id number]  Wal (2215)  0/0 180.00 MME Comm Ins  [place]  01/11/2018  2   01/11/2018  Oxycodone-Acetaminophen 10-325  150.00 [address]  [id number]  Cos (8734)  0/0 173.08 MME Other  [place]  12/26/2017  2   12/16/2017  Fentanyl 75 Mcg/hr Patch  10.00 [address]  [id number]  Wal (2215)  0/0

## 2018-05-28 ENCOUNTER — Other Ambulatory Visit: Payer: Self-pay | Admitting: Family Medicine

## 2018-05-28 DIAGNOSIS — R52 Pain, unspecified: Secondary | ICD-10-CM

## 2018-05-28 DIAGNOSIS — M549 Dorsalgia, unspecified: Secondary | ICD-10-CM

## 2018-05-28 DIAGNOSIS — M069 Rheumatoid arthritis, unspecified: Secondary | ICD-10-CM

## 2018-05-28 NOTE — Telephone Encounter (Signed)
PA initiated via Covermymeds; KEY: [id number]. PA approved.  Questionnaire submitted. PA Case [id number] Status: Approved. Authorization and Notifications Completed

## 2018-05-28 NOTE — Telephone Encounter (Signed)
Pt states that these patches will need PA. She is now with [employer] and has a new [employer] The company they use is [employer]. Can be done by escribe for the prior auth at covermymeds.[email or web address] or can call at [phone].

## 2018-05-28 NOTE — Telephone Encounter (Signed)
Copied from [place] [phone]. Topic: Quick Communication - Rx Refill/Question >> May 28, 2018  3:04 PM Judyann Munson wrote: Medication: oxyCODONE-acetaminophen (PERCOCET) 10-325 MG tablet   Has the patient contacted their pharmacy? No  Preferred Pharmacy (with phone number or street name): [facility] # [address], [place]. [phone] (Phone) [phone] (Fax)    Agent: Please be advised that RX refills may take up to 3 business days. We ask that you follow-up with your pharmacy.

## 2018-05-28 NOTE — Telephone Encounter (Signed)
Copied from [place] [phone]. Topic: Quick Communication - Rx Refill/Question >> May 28, 2018  3:09 PM Judyann Munson wrote: Medication: fentaNYL ([place]) 75 MCG/HR Has the patient contacted their pharmacy? Yes   Preferred Pharmacy (with phone number or street name):  [facility] #[id number] [facility], [place] AT [place] [phone] (Phone) [phone] (Fax)   Agent: Please be advised that RX refills may take up to 3 business days. We ask that you follow-up with your pharmacy.

## 2018-05-29 MED ORDER — OXYCODONE-ACETAMINOPHEN 10-325 MG PO TABS: 2.0000 | ORAL_TABLET | ORAL | 0 refills | Status: DC | PRN

## 2018-05-31 ENCOUNTER — Other Ambulatory Visit: Payer: Self-pay | Admitting: Family Medicine

## 2018-05-31 DIAGNOSIS — M549 Dorsalgia, unspecified: Secondary | ICD-10-CM

## 2018-05-31 NOTE — Telephone Encounter (Signed)
Copied from [place] [phone]. Topic: Quick Communication - Rx Refill/Question >> May 31, 2018  4:16 PM Windy Kalata wrote: Medication: fentaNYL ([place]) 75 MCG/HR   Has the patient contacted their pharmacy? Yes.   (Agent: If no, request that the patient contact the pharmacy for the refill.) (Agent: If yes, when and what did the pharmacy advise?) Pharmacy states that the prior auth was done but the prescription was never called in .   Preferred Pharmacy (with phone number or street name): [facility] #[id number] [facility], [place] - [place] AT [place] [phone] (Phone) [phone] (Fax)    Agent: Please be advised that RX refills may take up to 3 business days. We ask that you follow-up with your pharmacy.

## 2018-05-31 NOTE — Telephone Encounter (Signed)
Requested medication (s) are due for refill today:  Yes   Requested medication (s) are on the active medication list:  yes  Future visit scheduled:  yes  Last Refill: 05/01/18; #10; no refills  Next office visit 08/2018  There are no medications in this encounter.

## 2018-06-02 MED ORDER — FENTANYL 75 MCG/HR TD PT72: 75.0000 ug | MEDICATED_PATCH | TRANSDERMAL | 0 refills | Status: DC

## 2018-06-11 ENCOUNTER — Other Ambulatory Visit: Payer: Self-pay | Admitting: Family Medicine

## 2018-06-25 ENCOUNTER — Other Ambulatory Visit: Payer: Self-pay | Admitting: Family Medicine

## 2018-06-25 DIAGNOSIS — R52 Pain, unspecified: Secondary | ICD-10-CM

## 2018-06-25 DIAGNOSIS — M069 Rheumatoid arthritis, unspecified: Secondary | ICD-10-CM

## 2018-06-25 MED ORDER — OXYCODONE-ACETAMINOPHEN 10-325 MG PO TABS: 2.0000 | ORAL_TABLET | ORAL | 0 refills | Status: DC | PRN

## 2018-06-25 NOTE — Telephone Encounter (Signed)
Copied from [place] [id number]. Topic: Quick Communication - Rx Refill/Question >> Jun 25, 2018  2:46 PM Waldemar Dickens, Sade R wrote: Medication: oxyCODONE-acetaminophen (PERCOCET) 10-325 MG tablet  Has the patient contacted their pharmacy? Yes  Preferred Pharmacy (with phone number or street name): [facility] # [address], [place]. [phone] (Phone) [phone] (Fax)    Agent: Please be advised that RX refills may take up to 3 business days. We ask that you follow-up with your pharmacy.

## 2018-06-26 ENCOUNTER — Telehealth: Payer: Self-pay

## 2018-06-26 DIAGNOSIS — Z0279 Encounter for issue of other medical certificate: Secondary | ICD-10-CM

## 2018-06-26 NOTE — Telephone Encounter (Signed)
PA initiated via Covermymeds; KEY: [id number]. Awaiting determination.

## 2018-06-26 NOTE — Telephone Encounter (Signed)
PA approved.   Questionnaire submitted. PA Case [id number] Status: Approved. Authorization and Notifications Completed

## 2018-06-27 ENCOUNTER — Other Ambulatory Visit: Payer: Self-pay | Admitting: Family Medicine

## 2018-06-27 DIAGNOSIS — R52 Pain, unspecified: Secondary | ICD-10-CM

## 2018-06-27 DIAGNOSIS — M069 Rheumatoid arthritis, unspecified: Secondary | ICD-10-CM

## 2018-06-27 MED ORDER — OXYCODONE-ACETAMINOPHEN 10-325 MG PO TABS: 2.0000 | ORAL_TABLET | ORAL | 0 refills | Status: DC | PRN

## 2018-06-27 NOTE — Telephone Encounter (Signed)
Notified pt. 

## 2018-06-27 NOTE — Telephone Encounter (Signed)
Pt's rx for oxyCODONE-acetaminophen (PERCOCET) 10-325 MG tablet Was sent to incorrect pharmacy. It needs to go to  [facility] # [address], [place] [phone] (Phone) [phone] (Fax)   Please resend to [facility] and advise pt once done. Pt stated [facility] to [facility] rx there. CB#[phone]

## 2018-06-27 NOTE — Telephone Encounter (Signed)
I have sent med to [facility] please let patient know

## 2018-07-02 ENCOUNTER — Other Ambulatory Visit: Payer: Self-pay | Admitting: Family Medicine

## 2018-07-02 DIAGNOSIS — M549 Dorsalgia, unspecified: Secondary | ICD-10-CM

## 2018-07-02 MED ORDER — FENTANYL 75 MCG/HR TD PT72: 10.0000 | MEDICATED_PATCH | TRANSDERMAL | 0 refills | Status: DC

## 2018-07-02 NOTE — Telephone Encounter (Signed)
Copied from [place] [phone]. Topic: Quick Communication - Rx Refill/Question >> Jul 02, 2018 12:35 PM Keene Breath wrote: Medication: fentaNYL ([place]) 75 MCG/HR  Patient called to request a refill for the above medication  Preferred Pharmacy (with phone number or street name): [facility] #[id number] [facility], [place] - [place] AT [place] [phone] (Phone) [phone] (Fax)

## 2018-07-03 ENCOUNTER — Telehealth: Payer: Self-pay | Admitting: Family Medicine

## 2018-07-03 NOTE — Telephone Encounter (Signed)
Copied from [place] [phone]. Topic: General - Other >> Jul 03, 2018  9:32 AM Keene Breath wrote: Reason for CRM: Mia with [facility] called to get clarification on a prescription for fentaNYL ([place]) 75 MCG/HR, that was sent.  Please advise and call pharmacy back at [phone]

## 2018-07-04 NOTE — Telephone Encounter (Signed)
Called pharmacy and fixed rx 

## 2018-07-23 ENCOUNTER — Other Ambulatory Visit: Payer: Self-pay | Admitting: Family Medicine

## 2018-07-23 DIAGNOSIS — M069 Rheumatoid arthritis, unspecified: Secondary | ICD-10-CM

## 2018-07-23 DIAGNOSIS — R52 Pain, unspecified: Secondary | ICD-10-CM

## 2018-07-23 MED ORDER — OXYCODONE-ACETAMINOPHEN 10-325 MG PO TABS: 2.0000 | ORAL_TABLET | ORAL | 0 refills | Status: DC | PRN

## 2018-07-23 NOTE — Telephone Encounter (Signed)
Copied from [place] [phone]. Topic: Quick Communication - Rx Refill/Question >> Jul 23, 2018 12:01 PM Sheran Luz wrote: oxyCODONE-acetaminophen (PERCOCET) 10-325 MG tablet   Patient is requesting a refill of this medication.    Preferred Pharmacy (with phone number or street name):[id number] - [place], [place].  [phone] (Phone) [phone] (Fax)

## 2018-07-31 ENCOUNTER — Telehealth: Payer: Self-pay | Admitting: Family Medicine

## 2018-07-31 DIAGNOSIS — M549 Dorsalgia, unspecified: Secondary | ICD-10-CM

## 2018-07-31 MED ORDER — FENTANYL 75 MCG/HR TD PT72: 10.0000 | MEDICATED_PATCH | TRANSDERMAL | 0 refills | Status: DC

## 2018-07-31 NOTE — Telephone Encounter (Signed)
Requested medication (s) are due for refill today - yes- if to continue  Requested medication (s) are on the active medication list -yes  Future visit scheduled -yes  Last refill: 07/02/18  Notes to clinic: Patient is requesting a refill of non delegated Rx- may want to review sig also  Requested Prescriptions  Pending Prescriptions Disp Refills   fentaNYL (DURAGESIC) 75 MCG/HR 10 patch 0    Sig: Place 10 patches onto the skin every 3 (three) days.     Not Delegated - Analgesics:  Opioid Agonists Failed - 07/31/2018  3:23 PM      Failed - This refill cannot be delegated      Passed - Urine Drug Screen completed in last 360 days.      Passed - Valid encounter within last 6 months    Recent Outpatient Visits          5 months ago Encounter for long-term (current) use of medications   [facility]ivist at [place], MD   11 months ago Back pain, unspecified back location, unspecified back pain laterality, unspecified chronicity   Archivist at [place], MD   1 year ago Depression with anxiety   Archivist at [place], MD   1 year ago Routine health maintenance   [place] at [place], MD   1 year ago Osteoporosis, unspecified osteoporosis type, unspecified pathological fracture presence   Archivist at [place], MD      Future Appointments            In 1 month Mosie Lukes, MD [place] at [facility], [facility]            Requested Prescriptions  Pending Prescriptions Disp Refills   fentaNYL (DURAGESIC) 75 MCG/HR 10 patch 0    Sig: Place 10 patches onto the skin every 3 (three) days.     Not Delegated - Analgesics:  Opioid Agonists Failed - 07/31/2018  3:23 PM      Failed - This refill cannot be delegated      Passed  - Urine Drug Screen completed in last 360 days.      Passed - Valid encounter within last 6 months    Recent Outpatient Visits          5 months ago Encounter for long-term (current) use of medications   Archivist at [place], MD   11 months ago Back pain, unspecified back location, unspecified back pain laterality, unspecified chronicity   Archivist at [place], MD   1 year ago Depression with anxiety   Archivist at [place], MD   1 year ago Routine health maintenance   [place] at [place], MD   1 year ago Osteoporosis, unspecified osteoporosis type, unspecified pathological fracture presence   Archivist at [place], MD      Future Appointments            In 1 month Charlett Blake, [place]an, MD [place] at [place]

## 2018-07-31 NOTE — Telephone Encounter (Signed)
Copied from [place] [id number]. Topic: Quick Communication - Rx Refill/Question >> Jul 31, 2018  3:19 PM Ahmed Prima L wrote: Medication: fentaNYL ([place]) 75 MCG/HR  Has the patient contacted their pharmacy? Yes (Agent: If no, request that the patient contact the pharmacy for the refill.) (Agent: If yes, when and what did the pharmacy advise?)  Preferred Pharmacy (with phone number or street name): [facility] #[id number] [facility], [place] - [place] AT [address] [place] [facility] [address] Phone: [phone] Fax: [phone]    Agent: Please be advised that RX refills may take up to 3 business days. We ask that you follow-up with your pharmacy.

## 2018-08-01 NOTE — Telephone Encounter (Signed)
Opened in error

## 2018-08-02 ENCOUNTER — Telehealth: Payer: Self-pay | Admitting: Family Medicine

## 2018-08-02 ENCOUNTER — Other Ambulatory Visit: Payer: Self-pay | Admitting: Family Medicine

## 2018-08-02 NOTE — Telephone Encounter (Signed)
Copied from [place] [id number]. Topic: Quick Communication - Rx Refill/Question >> Aug 02, 2018  1:11 PM Rayann Heman wrote: Medication:fentaNYL ([place]) 25 MCG/HR [[id number]]  Ed calling from pharmacy called and stated that medication directions are incorrect. Can we please fix this.

## 2018-08-02 NOTE — Telephone Encounter (Signed)
Pt called in and stated that the pharmacy would not let her pick up med.  The instructions are in correct. It says apply 10 patches to skin instead of apply 1 patch to skin. Pharmacy stated this has to be corrected before she can pick it up    Best number  [place] on file

## 2018-08-02 NOTE — Telephone Encounter (Signed)
Duplicate telephone note--see note from 07/31/2018 which has been sent to PCP.

## 2018-08-03 ENCOUNTER — Telehealth: Payer: Self-pay | Admitting: Family Medicine

## 2018-08-03 DIAGNOSIS — M549 Dorsalgia, unspecified: Secondary | ICD-10-CM

## 2018-08-03 MED ORDER — FENTANYL 75 MCG/HR TD PT72: 1.0000 | MEDICATED_PATCH | TRANSDERMAL | 0 refills | Status: DC

## 2018-08-03 NOTE — Telephone Encounter (Signed)
Call from [facility] Pain patch was ordered wrong (10 patches instead of one)  I re sent it

## 2018-08-03 NOTE — Telephone Encounter (Signed)
2nd message from answering service [facility]  I re [facility] on [address]

## 2018-08-04 NOTE — Telephone Encounter (Signed)
Was handled by physician on call.

## 2018-08-04 NOTE — Telephone Encounter (Signed)
Thanks

## 2018-08-10 ENCOUNTER — Other Ambulatory Visit: Payer: Self-pay | Admitting: Family Medicine

## 2018-08-17 ENCOUNTER — Telehealth: Payer: Self-pay | Admitting: Family Medicine

## 2018-08-17 DIAGNOSIS — R52 Pain, unspecified: Secondary | ICD-10-CM

## 2018-08-17 DIAGNOSIS — M069 Rheumatoid arthritis, unspecified: Secondary | ICD-10-CM

## 2018-08-17 NOTE — Telephone Encounter (Signed)
Can pt have a refill on this medication  

## 2018-08-19 ENCOUNTER — Other Ambulatory Visit: Payer: Self-pay | Admitting: Family Medicine

## 2018-08-19 DIAGNOSIS — M069 Rheumatoid arthritis, unspecified: Secondary | ICD-10-CM

## 2018-08-19 DIAGNOSIS — R52 Pain, unspecified: Secondary | ICD-10-CM

## 2018-08-19 MED ORDER — OXYCODONE-ACETAMINOPHEN 10-325 MG PO TABS: 2.0000 | ORAL_TABLET | ORAL | 0 refills | Status: DC | PRN

## 2018-08-19 NOTE — Telephone Encounter (Signed)
Requesting:Percocet Contract:yes UDS:02/11/18 low risk next screen 3/(23/20 Last OV:02/11/18 Next OV:09/02/18 Last Refill:07/23/18  #[id number] Database:   Please advise

## 2018-08-19 NOTE — Telephone Encounter (Signed)
Copied from [place] [phone]. Topic: Quick Communication - See Telephone Encounter >> Aug 19, 2018  4:58 PM Loma Boston wrote: [facility] # [address], [place] [phone] (Phone) [phone] (Fax)  . See Telephone encounter for: 08/19/18.oxyCODONE-acetaminophen (PERCOCET) 10-325 MG tablet This [facility], [facility] was [facility]. This is a confusion due to this is the only one to [employer] due to insurance purposes. Please resend to [facility], Pt has cancelled [place].

## 2018-08-19 NOTE — Telephone Encounter (Signed)
Pt called in to check the status of her refill request. Advised per current status. Pt would like to make sure that this Rx is sent to the   [facility] # [address] [place]. [phone] (Phone) [phone] (Fax)

## 2018-08-19 NOTE — Telephone Encounter (Signed)
Was this done? Do I prescribe?

## 2018-08-20 MED ORDER — OXYCODONE-ACETAMINOPHEN 10-325 MG PO TABS: 2.0000 | ORAL_TABLET | ORAL | 0 refills | Status: DC | PRN

## 2018-08-20 NOTE — Telephone Encounter (Signed)
Patient checking on the status of Medication refill request for oxyCODONE-acetaminophen (PERCOCET) 10-325 MG tablet. Patient states pharmacy business hours have changed therefore will be closing earlier. Please advise

## 2018-08-20 NOTE — Telephone Encounter (Signed)
Can you resend to [facility] please?

## 2018-08-20 NOTE — Telephone Encounter (Signed)
Medication was sent in on 08/19/2018 at 4:56pm

## 2018-08-30 ENCOUNTER — Telehealth: Payer: Self-pay | Admitting: Family Medicine

## 2018-08-30 NOTE — Telephone Encounter (Unsigned)
Copied from [place] [phone]. Topic: Quick Communication - Rx Refill/Question >> Aug 30, 2018  3:00 PM Mcneil, Ja-Kwan wrote: Medication: fentaNYL ([place]) 75 MCG/HR  Has the patient contacted their pharmacy? no  Preferred Pharmacy (with phone number or street name): [facility] #[id number], [place] AT [place] [address] [phone] (Phone)  [phone] (Fax)  Agent: Please be advised that RX refills may take up to 3 business days. We ask that you follow-up with your pharmacy.

## 2018-09-02 ENCOUNTER — Ambulatory Visit: Payer: Self-pay | Admitting: Family Medicine

## 2018-09-02 ENCOUNTER — Other Ambulatory Visit: Payer: Self-pay

## 2018-09-02 ENCOUNTER — Other Ambulatory Visit: Payer: Self-pay | Admitting: Family Medicine

## 2018-09-02 ENCOUNTER — Ambulatory Visit (INDEPENDENT_AMBULATORY_CARE_PROVIDER_SITE_OTHER): Payer: Self-pay | Admitting: Family Medicine

## 2018-09-02 DIAGNOSIS — I10 Essential (primary) hypertension: Secondary | ICD-10-CM

## 2018-09-02 DIAGNOSIS — R739 Hyperglycemia, unspecified: Secondary | ICD-10-CM

## 2018-09-02 DIAGNOSIS — M05742 Rheumatoid arthritis with rheumatoid factor of left hand without organ or systems involvement: Secondary | ICD-10-CM

## 2018-09-02 DIAGNOSIS — M549 Dorsalgia, unspecified: Secondary | ICD-10-CM

## 2018-09-02 DIAGNOSIS — M05741 Rheumatoid arthritis with rheumatoid factor of right hand without organ or systems involvement: Secondary | ICD-10-CM

## 2018-09-02 MED ORDER — DILTIAZEM HCL ER BEADS 240 MG PO CP24: 240.0000 mg | ORAL_CAPSULE | Freq: Every day | ORAL | 1 refills | Status: DC

## 2018-09-02 MED ORDER — HYDROCHLOROTHIAZIDE 12.5 MG PO CAPS: 12.5000 mg | ORAL_CAPSULE | Freq: Every day | ORAL | 1 refills | Status: DC

## 2018-09-02 MED ORDER — VENLAFAXINE HCL ER 75 MG PO CP24: ORAL_CAPSULE | ORAL | 1 refills | Status: DC

## 2018-09-02 MED ORDER — FENTANYL 75 MCG/HR TD PT72: 1.0000 | MEDICATED_PATCH | TRANSDERMAL | 0 refills | Status: DC

## 2018-09-02 MED ORDER — CITALOPRAM HYDROBROMIDE 20 MG PO TABS: 20.0000 mg | ORAL_TABLET | Freq: Every day | ORAL | 5 refills | Status: DC

## 2018-09-02 MED ORDER — LOSARTAN POTASSIUM 50 MG PO TABS: 50.0000 mg | ORAL_TABLET | Freq: Every day | ORAL | 1 refills | Status: DC

## 2018-09-02 MED ORDER — MELOXICAM 15 MG PO TABS: 15.0000 mg | ORAL_TABLET | Freq: Every day | ORAL | 5 refills | Status: DC

## 2018-09-02 NOTE — Assessment & Plan Note (Signed)
Recent adequate control no changes.

## 2018-09-02 NOTE — Telephone Encounter (Signed)
I will just refill at her visit so I make sure to get the right pharmacy thanks

## 2018-09-02 NOTE — Assessment & Plan Note (Signed)
Has established with a new rheumatologist and they want her to be on an immune modulating drugs and she is not interested in doing that presently as she continues to work and she is afraid of Covid infection. She is given a prescription for Meloxicam to add and she will stop Ibuprofen to see if that helps her pain some

## 2018-09-02 NOTE — Telephone Encounter (Signed)
Patient requesting refill on her Duragesic patches. Last refill 08/03/18 #10. States she has appointment set up with you today. Please advise if ok to refill.

## 2018-09-02 NOTE — Assessment & Plan Note (Signed)
hgba1c acceptable, minimize simple carbs. Increase exercise as tolerated.  

## 2018-09-02 NOTE — Progress Notes (Signed)
Virtual Visit via Video Note  I connected with Jennifer Kelly on 09/02/18 at  3:40 PM EDT by a video enabled telemedicine application and verified that I am speaking with the correct person using two identifiers.   I discussed the limitations of evaluation and management by telemedicine and the availability of in person appointments. The patient expressed understanding and agreed to proceed. Jennifer Kelly, CMA was able to get patient set up on video platform    Subjective:    Patient ID: Jennifer Kelly, female    DOB: 08-17-1960, 58 y.o.   MRN: [id number]  No chief complaint on file.   HPI Patient is in today for follow up on chronic medical concerns including rheumatoid arthritis, pain, depression with anxiety, osteopenia and more. She is hesitant to take more Prednisone due to her osteopenia. She is in tremendous pain despite her Fentanyl and Oxycodone. She is taking Ibuprofen 800 mg several times a day without pain control. Her new rheumatologist wants her on a new biologic med but she is worried about immune suppression. Anxiety is noted as she continues to try and work. No complaints of recent febrile illness or hospitalizations. Denies CP/palp/SOB/HA/congestion/fevers/GI or GU c/o. Taking meds as prescribed  Past Medical History:  Diagnosis Date  . Acute bronchitis 01/05/2014  . Arthritis   . Chicken pox as a child  . Dermatitis 09/09/2014  . Ganglion cyst of left foot 09/09/2014  . H/O mumps   . H/O tobacco use, presenting hazards to health 10/08/2009   Qualifier: Diagnosis of  By: Linda Hedges MD, [employer] Knuckles  Last cigarette 02/16/2016   . History of chicken pox   . Hyperglycemia 11/30/2015  . Hypertension   . Morbid obesity ([place]) 08/31/2009   Qualifier: Diagnosis of  By: Linda Hedges MD, Heinz Knuckles  BMI 34.4(Sept '13) BMI 34 (March '15)   . Mumps as a child  . Osteopenia 01/02/2017  . Osteoporosis 11/20/2016  . Psoriatic arthritis ([place])   . Rheumatoid arthritis ([place])   . Shingles 2014     Past Surgical History:  Procedure Laterality Date  . BACK SURGERY  2003  . NECK SURGERY  02/17/2016   C4, C5 & C6  . rotater cuff  20 yrs ago   left shoulder  . TOOTH EXTRACTION  05/2012  . WISDOM TOOTH EXTRACTION  58 yrs old    Family History  Problem Relation Age of Onset  . Parkinson's disease Mother   . Heart disease Mother   . Hypertension Mother   . Diabetes Father        type 2  . Hypertension Father   . Stroke Father   . Vascular Disease Brother   . Hypertension Brother   . Diabetes Brother   . Heart attack Paternal Uncle     Social History   Socioeconomic History  . Marital status: Divorced    Spouse name: Not on file  . Number of children: 0  . Years of education: Not on file  . Highest education level: Not on file  Occupational History  . Occupation: help Therapist, sports  Social Needs  . Financial resource strain: Not on file  . Food insecurity:    Worry: Not on file    Inability: Not on file  . Transportation needs:    Medical: Not on file    Non-medical: Not on file  Tobacco Use  . Smoking status: Former Smoker    Start date: 11/30/2015  . Smokeless tobacco: Never Used  Substance  and Sexual Activity  . Alcohol use: Yes    Comment: occ wine  . Drug use: No  . Sexual activity: Never    Comment: no dietary restrictions. lives alone  Lifestyle  . Physical activity:    Days per week: Not on file    Minutes per session: Not on file  . Stress: Not on file  Relationships  . Social connections:    Talks on phone: Not on file    Gets together: Not on file    Attends religious service: Not on file    Active member of club or organization: Not on file    Attends meetings of clubs or organizations: Not on file    Relationship status: Not on file  . Intimate partner violence:    Fear of current or ex partner: Not on file    Emotionally abused: Not on file    Physically abused: Not on file    Forced sexual activity: Not on file  Other Topics  Concern  . Not on file  Social History Narrative  . Not on file    Outpatient Medications Prior to Visit  Medication Sig Dispense Refill  . albuterol (PROVENTIL HFA;VENTOLIN HFA) 108 (90 Base) MCG/ACT inhaler INHALE 2 PUFFS BY MOUTH EVERY 6 HOURS AS NEEDED FOR WHEEZING 18 g 5  . Baricitinib (OLUMIANT) 2 MG TABS Take 2 mg by mouth daily. 30 tablet 0  . gabapentin (NEURONTIN) 600 MG tablet TAKE 1 TABLET(600 MG) BY MOUTH THREE TIMES DAILY 90 tablet 1  . oxyCODONE-acetaminophen (PERCOCET) 10-325 MG tablet Take 2 tablets by mouth every 4 (four) hours as needed for pain. 150 tablet 0  . predniSONE (DELTASONE) 10 MG tablet Take 1 tablet (10 mg total) by mouth daily as needed. 30 tablet 0  . predniSONE (DELTASONE) 5 MG tablet Take 20 mg by mouth daily.   0  . citalopram (CELEXA) 20 MG tablet Take 1 tablet (20 mg total) by mouth daily. 30 tablet 5  . diltiazem (TIAZAC) 240 MG 24 hr capsule Take 1 capsule (240 mg total) by mouth daily. 30 capsule 5  . fentaNYL (DURAGESIC) 75 MCG/HR Place 1 patch onto the skin every 3 (three) days. 10 patch 0  . hydrochlorothiazide (MICROZIDE) 12.5 MG capsule Take 1 capsule (12.5 mg total) by mouth daily. 90 capsule 1  . ibuprofen (ADVIL,MOTRIN) 800 MG tablet     . losartan (COZAAR) 50 MG tablet Take 1 tablet (50 mg total) by mouth daily. 90 tablet 1  . venlafaxine XR (EFFEXOR-XR) 75 MG 24 hr capsule TAKE 1 CAPSULE(75 MG) BY MOUTH DAILY WITH BREAKFAST 90 capsule 1   No facility-administered medications prior to visit.     Allergies  Allergen Reactions  . Simponi [Golimumab] Rash    States that it was systemic    Review of Systems  Constitutional: Positive for malaise/fatigue. Negative for fever.  HENT: Negative for congestion.   Eyes: Negative for blurred vision.  Respiratory: Negative for shortness of breath.   Cardiovascular: Negative for chest pain, palpitations and leg swelling.  Gastrointestinal: Negative for abdominal pain, blood in stool and nausea.   Genitourinary: Negative for dysuria and frequency.  Musculoskeletal: Positive for back pain, joint pain, myalgias and neck pain. Negative for falls.  Skin: Negative for rash.  Neurological: Negative for dizziness, loss of consciousness and headaches.  Endo/Heme/Allergies: Negative for environmental allergies.  Psychiatric/Behavioral: Negative for depression. The patient is not nervous/anxious.        Objective:  Physical Exam Constitutional:      Appearance: Normal appearance.  HENT:     Head: Normocephalic and atraumatic.     Nose: Nose normal.  Pulmonary:     Effort: Pulmonary effort is normal.  Musculoskeletal:     Comments: Nodules noted on fingers and nodule noted on flexor surface of distal left arm  Neurological:     Mental Status: She is alert and oriented to person, place, and time.  Psychiatric:        Mood and Affect: Mood normal.        Behavior: Behavior normal.     There were no vitals taken for this visit. Wt Readings from Last 3 Encounters:  02/11/18 238 lb (108 kg)  08/09/17 245 lb 12.8 oz (111.5 kg)  05/10/17 251 lb 3.2 oz (113.9 kg)    Diabetic Foot Exam - Simple   No data filed     Lab Results  Component Value Date   WBC 10.5 02/11/2018   HGB 14.4 02/11/2018   HCT 42.5 02/11/2018   PLT 243.0 02/11/2018   GLUCOSE 85 02/11/2018   CHOL 161 02/11/2018   TRIG 105.0 02/11/2018   HDL 68.90 02/11/2018   LDLCALC 71 02/11/2018   ALT 20 02/11/2018   AST 16 02/11/2018   NA 138 02/11/2018   K 3.8 02/11/2018   CL 98 02/11/2018   CREATININE 0.63 02/11/2018   BUN 20 02/11/2018   CO2 32 02/11/2018   TSH 1.07 02/11/2018   HGBA1C 6.0 02/11/2018    Lab Results  Component Value Date   TSH 1.07 02/11/2018   Lab Results  Component Value Date   WBC 10.5 02/11/2018   HGB 14.4 02/11/2018   HCT 42.5 02/11/2018   MCV 90.6 02/11/2018   PLT 243.0 02/11/2018   Lab Results  Component Value Date   NA 138 02/11/2018   K 3.8 02/11/2018   CO2 32  02/11/2018   GLUCOSE 85 02/11/2018   BUN 20 02/11/2018   CREATININE 0.63 02/11/2018   BILITOT 0.6 02/11/2018   ALKPHOS 84 02/11/2018   AST 16 02/11/2018   ALT 20 02/11/2018   PROT 7.6 02/11/2018   ALBUMIN 4.2 02/11/2018   CALCIUM 9.4 02/11/2018   ANIONGAP 8 11/26/2014   GFR 103.01 02/11/2018   Lab Results  Component Value Date   CHOL 161 02/11/2018   Lab Results  Component Value Date   HDL 68.90 02/11/2018   Lab Results  Component Value Date   LDLCALC 71 02/11/2018   Lab Results  Component Value Date   TRIG 105.0 02/11/2018   Lab Results  Component Value Date   CHOLHDL 2 02/11/2018   Lab Results  Component Value Date   HGBA1C 6.0 02/11/2018       Assessment & Plan:   Problem List Items Addressed This Visit    Rheumatoid arthritis ([place])    Has established with a new rheumatologist and they want her to be on an immune modulating drugs and she is not interested in doing that presently as she continues to work and she is afraid of Covid infection. She is given a prescription for Meloxicam to add and she will stop Ibuprofen to see if that helps her pain some      Relevant Medications   fentaNYL (DURAGESIC) 75 MCG/HR   meloxicam (MOBIC) 15 MG tablet   Hypertension    Recent adequate control no changes.       Relevant Medications   diltiazem (TIAZAC) 240 MG  24 hr capsule   losartan (COZAAR) 50 MG tablet   hydrochlorothiazide (MICROZIDE) 12.5 MG capsule   Hyperglycemia    hgba1c acceptable, minimize simple carbs. Increase exercise as tolerated.       Other Visit Diagnoses    Back pain, unspecified back location, unspecified back pain laterality, unspecified chronicity       Relevant Medications   fentaNYL (DURAGESIC) 75 MCG/HR   meloxicam (MOBIC) 15 MG tablet      I have discontinued Ajahnae M. Lafata's ibuprofen. I have also changed her venlafaxine XR. Additionally, I am having her start on meloxicam. Lastly, I am having her maintain her predniSONE,  Baricitinib, predniSONE, albuterol, gabapentin, oxyCODONE-acetaminophen, fentaNYL, diltiazem, losartan, hydrochlorothiazide, and citalopram.  Meds ordered this encounter  Medications  . fentaNYL (DURAGESIC) 75 MCG/HR    Sig: Place 1 patch onto the skin every 3 (three) days.    Dispense:  10 patch    Refill:  0  . diltiazem (TIAZAC) 240 MG 24 hr capsule    Sig: Take 1 capsule (240 mg total) by mouth daily.    Dispense:  90 capsule    Refill:  1  . losartan (COZAAR) 50 MG tablet    Sig: Take 1 tablet (50 mg total) by mouth daily.    Dispense:  90 tablet    Refill:  1  . hydrochlorothiazide (MICROZIDE) 12.5 MG capsule    Sig: Take 1 capsule (12.5 mg total) by mouth daily.    Dispense:  90 capsule    Refill:  1  . citalopram (CELEXA) 20 MG tablet    Sig: Take 1 tablet (20 mg total) by mouth daily.    Dispense:  30 tablet    Refill:  5  . venlafaxine XR (EFFEXOR-XR) 75 MG 24 hr capsule    Sig: Take 1 capsule po daily    Dispense:  90 capsule    Refill:  1  . meloxicam (MOBIC) 15 MG tablet    Sig: Take 1 tablet (15 mg total) by mouth daily.    Dispense:  30 tablet    Refill:  5       I discussed the assessment and treatment plan with the patient. The patient was provided an opportunity to ask questions and all were answered. The patient agreed with the plan and demonstrated an understanding of the instructions.   The patient was advised to call back or seek an in-person evaluation if the symptoms worsen or if the condition fails to improve as anticipated.  I provided 15 minutes of non-face-to-face time during this encounter.   Penni Homans, MD

## 2018-09-03 NOTE — Telephone Encounter (Signed)
PCP refilled at virtual visit

## 2018-09-17 ENCOUNTER — Other Ambulatory Visit: Payer: Self-pay | Admitting: Family Medicine

## 2018-09-17 DIAGNOSIS — M069 Rheumatoid arthritis, unspecified: Secondary | ICD-10-CM

## 2018-09-17 DIAGNOSIS — R52 Pain, unspecified: Secondary | ICD-10-CM

## 2018-09-17 MED ORDER — OXYCODONE-ACETAMINOPHEN 10-325 MG PO TABS: 2.0000 | ORAL_TABLET | ORAL | 0 refills | Status: DC | PRN

## 2018-09-17 NOTE — Telephone Encounter (Signed)
Copied from [place] [phone]. Topic: Quick Communication - See Telephone Encounter >> Sep 17, 2018 10:40 AM Ahmed Prima L wrote: CRM for notification. See Telephone encounter for: 09/17/18.  oxyCODONE-acetaminophen (PERCOCET) 10-325 MG tablet  [facility] #[id number] [address], [place] AT [place] [facility] [address] Phone: [phone] Fax: [phone]  She would like to know could it be sent in today since it is her birthday and is leaving for the beach today. Please Advise.

## 2018-09-18 NOTE — Telephone Encounter (Signed)
Rx refilled yesterday.

## 2018-09-18 NOTE — Telephone Encounter (Signed)
Patient stated her oxyCODONE-acetaminophen (PERCOCET) 10-325 MG tablet prescription was sent to the incorrect pharmacy.  It should have gone to [facility] in [place].  Please resend the prescription to the correct pharmacy.

## 2018-09-18 NOTE — Telephone Encounter (Signed)
The med goes to [facility] in [place]

## 2018-09-19 MED ORDER — OXYCODONE-ACETAMINOPHEN 10-325 MG PO TABS: 2.0000 | ORAL_TABLET | ORAL | 0 refills | Status: DC | PRN

## 2018-09-19 NOTE — Addendum Note (Signed)
Addended by: Wynonia Musty A on: 09/19/2018 08:49 AM   Modules accepted: Orders

## 2018-10-05 ENCOUNTER — Other Ambulatory Visit: Payer: Self-pay | Admitting: Family Medicine

## 2018-10-08 ENCOUNTER — Other Ambulatory Visit: Payer: Self-pay | Admitting: Family Medicine

## 2018-10-08 DIAGNOSIS — R52 Pain, unspecified: Secondary | ICD-10-CM

## 2018-10-08 DIAGNOSIS — M069 Rheumatoid arthritis, unspecified: Secondary | ICD-10-CM

## 2018-10-08 NOTE — Telephone Encounter (Signed)
Copied from [place] [phone]. Topic: Quick Communication - Rx Refill/Question >> Oct 08, 2018  3:52 PM Sheran Luz wrote: Medication: oxyCODONE-acetaminophen (PERCOCET) 10-325 MG tablet   Patient is requesting a refill of this medication (a little early, states patient, as she is going on vacation on Thursday).  She is requesting this medication be sent to [facility].   Preferred Pharmacy (with phone number or street name):[id number] - [place], [address] [place]. [phone] (Phone) [phone] (Fax)

## 2018-10-09 ENCOUNTER — Telehealth: Payer: Self-pay | Admitting: Family Medicine

## 2018-10-09 MED ORDER — OXYCODONE-ACETAMINOPHEN 10-325 MG PO TABS: 2.0000 | ORAL_TABLET | ORAL | 0 refills | Status: DC | PRN

## 2018-10-09 NOTE — Telephone Encounter (Signed)
Copied from [place] [phone]. Topic: Quick Communication - Rx Refill/Question >> Oct 09, 2018 12:32 PM Nils Flack wrote: Medication:oxyCODONE-acetaminophen (PERCOCET) 10-325 MG tablet  Has the patient contacted their pharmacy? Yes.   (Agent: If no, request that the patient contact the pharmacy for the refill.) (Agent: If yes, when and what did the pharmacy advise?)  Preferred Pharmacy (with phone number or street name): [facility] in [place] called - how long has pt been under length of treatment with this prescribed? Expected length of treatment w/this provider? Is she going to be referred to provider in [place]> Does she have a controlled medication  contract? Is she drug tested? I she undergoing any other treatment besides opiods? Cb [phone] ryan    Agent: Please be advised that RX refills may take up to 3 business days. We ask that you follow-up with your pharmacy.

## 2018-10-09 NOTE — Telephone Encounter (Signed)
Spoke with pharmacists to clarify rx for the percocet

## 2018-10-28 ENCOUNTER — Other Ambulatory Visit: Payer: Self-pay | Admitting: Family Medicine

## 2018-10-28 DIAGNOSIS — M549 Dorsalgia, unspecified: Secondary | ICD-10-CM

## 2018-10-28 MED ORDER — FENTANYL 75 MCG/HR TD PT72: 1.0000 | MEDICATED_PATCH | TRANSDERMAL | 0 refills | Status: DC

## 2018-10-28 NOTE — Telephone Encounter (Signed)
Copied from [place] [phone]. Topic: Quick Communication - Rx Refill/Question >> Oct 28, 2018  2:25 PM Selinda Flavin B, NT wrote: Medication: fentaNYL ([place]) 75 MCG/HR  Has the patient contacted their pharmacy? yes (Agent: If no, request that the patient contact the pharmacy for the refill.) (Agent: If yes, when and what did the pharmacy advise?)  Preferred Pharmacy (with phone number or street name): [facility] #[id number] - [place], [place] AT [place]: Please be advised that RX refills may take up to 3 business days. We ask that you follow-up with your pharmacy.

## 2018-11-01 ENCOUNTER — Ambulatory Visit (INDEPENDENT_AMBULATORY_CARE_PROVIDER_SITE_OTHER): Payer: BLUE CROSS/BLUE SHIELD | Admitting: Family Medicine

## 2018-11-01 ENCOUNTER — Other Ambulatory Visit: Payer: Self-pay

## 2018-11-01 DIAGNOSIS — M545 Low back pain, unspecified: Secondary | ICD-10-CM

## 2018-11-01 DIAGNOSIS — M549 Dorsalgia, unspecified: Secondary | ICD-10-CM | POA: Diagnosis not present

## 2018-11-01 DIAGNOSIS — R52 Pain, unspecified: Secondary | ICD-10-CM | POA: Diagnosis not present

## 2018-11-01 DIAGNOSIS — M069 Rheumatoid arthritis, unspecified: Secondary | ICD-10-CM | POA: Diagnosis not present

## 2018-11-01 DIAGNOSIS — I10 Essential (primary) hypertension: Secondary | ICD-10-CM | POA: Diagnosis not present

## 2018-11-01 DIAGNOSIS — R739 Hyperglycemia, unspecified: Secondary | ICD-10-CM

## 2018-11-01 MED ORDER — OXYCODONE-ACETAMINOPHEN 10-325 MG PO TABS: 2.0000 | ORAL_TABLET | ORAL | 0 refills | Status: DC | PRN

## 2018-11-04 NOTE — Assessment & Plan Note (Signed)
no changes to meds. Encouraged heart healthy diet such as the DASH diet and exercise as tolerated.  

## 2018-11-04 NOTE — Assessment & Plan Note (Signed)
Encouraged to stay as active as is tolerable

## 2018-11-04 NOTE — Progress Notes (Signed)
Virtual Visit via Phone Note  I connected with Jennifer Kelly on 11/01/2018 at 11:00 AM EDT by a phone enabled telemedicine application and verified that I am speaking with the correct person using two identifiers.  Location: Patient: home Provider: home   I discussed the limitations of evaluation and management by telemedicine and the availability of in person appointments. The patient expressed understanding and agreed to proceed. Magdalene Molly, CMA was able to get patient set up on phone visit after being unable to get video visit set up for patient.    Subjective:    Patient ID: Jennifer Kelly, female    DOB: 20-Aug-1960, 58 y.o.   MRN: [id number]  No chief complaint on file.   HPI Patient is in today for follow up on rheumatoid arthritis, chronic pain, hypertension and more. No recent febrile illness or hospitalizations. She was off of her biologics for a time and has had a bad flare in her pain. Has been unable to rest or get her pain under control. No injury or fall. Denies CP/palp/SOB/HA/congestion/fevers/GI or GU c/o. Taking meds as prescribed  Past Medical History:  Diagnosis Date  . Acute bronchitis 01/05/2014  . Arthritis   . Chicken pox as a child  . Dermatitis 09/09/2014  . Ganglion cyst of left foot 09/09/2014  . H/O mumps   . H/O tobacco use, presenting hazards to health 10/08/2009   Qualifier: Diagnosis of  By: Linda Hedges MD, Heinz Knuckles  Last cigarette 02/16/2016   . History of chicken pox   . Hyperglycemia 11/30/2015  . Hypertension   . Morbid obesity ([place]) 08/31/2009   Qualifier: Diagnosis of  By: Linda Hedges MD, Heinz Knuckles  BMI 34.4(Sept '13) BMI 34 (March '15)   . Mumps as a child  . Osteopenia 01/02/2017  . Osteoporosis 11/20/2016  . Psoriatic arthritis ([place])   . Rheumatoid arthritis ([place])   . Shingles 2014    Past Surgical History:  Procedure Laterality Date  . BACK SURGERY  2003  . NECK SURGERY  02/17/2016   C4, C5 & C6  . rotater cuff  20 yrs ago   left shoulder   . TOOTH EXTRACTION  05/2012  . WISDOM TOOTH EXTRACTION  58 yrs old    Family History  Problem Relation Age of Onset  . Parkinson's disease Mother   . Heart disease Mother   . Hypertension Mother   . Diabetes Father        type 2  . Hypertension Father   . Stroke Father   . Vascular Disease Brother   . Hypertension Brother   . Diabetes Brother   . Heart attack Paternal Uncle     Social History   Socioeconomic History  . Marital status: Divorced    Spouse name: Not on file  . Number of children: 0  . Years of education: Not on file  . Highest education level: Not on file  Occupational History  . Occupation: help Therapist, sports  Social Needs  . Financial resource strain: Not on file  . Food insecurity    Worry: Not on file    Inability: Not on file  . Transportation needs    Medical: Not on file    Non-medical: Not on file  Tobacco Use  . Smoking status: Former Smoker    Start date: 11/30/2015  . Smokeless tobacco: Never Used  Substance and Sexual Activity  . Alcohol use: Yes    Comment: occ wine  . Drug use: No  .  Sexual activity: Never    Comment: no dietary restrictions. lives alone  Lifestyle  . Physical activity    Days per week: Not on file    Minutes per session: Not on file  . Stress: Not on file  Relationships  . Social Herbalist on phone: Not on file    Gets together: Not on file    Attends religious service: Not on file    Active member of club or organization: Not on file    Attends meetings of clubs or organizations: Not on file    Relationship status: Not on file  . Intimate partner violence    Fear of current or ex partner: Not on file    Emotionally abused: Not on file    Physically abused: Not on file    Forced sexual activity: Not on file  Other Topics Concern  . Not on file  Social History Narrative  . Not on file    Outpatient Medications Prior to Visit  Medication Sig Dispense Refill  . albuterol (PROVENTIL  HFA;VENTOLIN HFA) 108 (90 Base) MCG/ACT inhaler INHALE 2 PUFFS BY MOUTH EVERY 6 HOURS AS NEEDED FOR WHEEZING 18 g 5  . Baricitinib (OLUMIANT) 2 MG TABS Take 2 mg by mouth daily. 30 tablet 0  . citalopram (CELEXA) 20 MG tablet Take 1 tablet (20 mg total) by mouth daily. 30 tablet 5  . diltiazem (TIAZAC) 240 MG 24 hr capsule Take 1 capsule (240 mg total) by mouth daily. 90 capsule 1  . fentaNYL (DURAGESIC) 75 MCG/HR Place 1 patch onto the skin every 3 (three) days. 10 patch 0  . gabapentin (NEURONTIN) 600 MG tablet TAKE 1 TABLET(600 MG) BY MOUTH THREE TIMES DAILY 90 tablet 1  . hydrochlorothiazide (MICROZIDE) 12.5 MG capsule Take 1 capsule (12.5 mg total) by mouth daily. 90 capsule 1  . losartan (COZAAR) 50 MG tablet Take 1 tablet (50 mg total) by mouth daily. 90 tablet 1  . predniSONE (DELTASONE) 10 MG tablet Take 1 tablet (10 mg total) by mouth daily as needed. 30 tablet 0  . predniSONE (DELTASONE) 5 MG tablet Take 20 mg by mouth daily.   0  . venlafaxine XR (EFFEXOR-XR) 75 MG 24 hr capsule Take 1 capsule po daily 90 capsule 1  . meloxicam (MOBIC) 15 MG tablet Take 1 tablet (15 mg total) by mouth daily. 30 tablet 5  . oxyCODONE-acetaminophen (PERCOCET) 10-325 MG tablet Take 2 tablets by mouth every 4 (four) hours as needed for pain. 150 tablet 0   No facility-administered medications prior to visit.     Allergies  Allergen Reactions  . Simponi [Golimumab] Rash    States that it was systemic    Review of Systems  Constitutional: Positive for malaise/fatigue. Negative for fever.  HENT: Negative for congestion.   Eyes: Negative for blurred vision.  Respiratory: Negative for shortness of breath.   Cardiovascular: Negative for chest pain, palpitations and leg swelling.  Gastrointestinal: Negative for abdominal pain, blood in stool and nausea.  Genitourinary: Negative for dysuria and frequency.  Musculoskeletal: Positive for back pain, joint pain, myalgias and neck pain. Negative for falls.   Skin: Negative for rash.  Neurological: Negative for dizziness, loss of consciousness and headaches.  Endo/Heme/Allergies: Negative for environmental allergies.  Psychiatric/Behavioral: Negative for depression. The patient is not nervous/anxious.        Objective:    Physical Exam unable to obtain via telephone  There were no vitals taken for this visit.  Wt Readings from Last 3 Encounters:  02/11/18 238 lb (108 kg)  08/09/17 245 lb 12.8 oz (111.5 kg)  05/10/17 251 lb 3.2 oz (113.9 kg)    Diabetic Foot Exam - Simple   No data filed     Lab Results  Component Value Date   WBC 10.5 02/11/2018   HGB 14.4 02/11/2018   HCT 42.5 02/11/2018   PLT 243.0 02/11/2018   GLUCOSE 85 02/11/2018   CHOL 161 02/11/2018   TRIG 105.0 02/11/2018   HDL 68.90 02/11/2018   LDLCALC 71 02/11/2018   ALT 20 02/11/2018   AST 16 02/11/2018   NA 138 02/11/2018   K 3.8 02/11/2018   CL 98 02/11/2018   CREATININE 0.63 02/11/2018   BUN 20 02/11/2018   CO2 32 02/11/2018   TSH 1.07 02/11/2018   HGBA1C 6.0 02/11/2018    Lab Results  Component Value Date   TSH 1.07 02/11/2018   Lab Results  Component Value Date   WBC 10.5 02/11/2018   HGB 14.4 02/11/2018   HCT 42.5 02/11/2018   MCV 90.6 02/11/2018   PLT 243.0 02/11/2018   Lab Results  Component Value Date   NA 138 02/11/2018   K 3.8 02/11/2018   CO2 32 02/11/2018   GLUCOSE 85 02/11/2018   BUN 20 02/11/2018   CREATININE 0.63 02/11/2018   BILITOT 0.6 02/11/2018   ALKPHOS 84 02/11/2018   AST 16 02/11/2018   ALT 20 02/11/2018   PROT 7.6 02/11/2018   ALBUMIN 4.2 02/11/2018   CALCIUM 9.4 02/11/2018   ANIONGAP 8 11/26/2014   GFR 103.01 02/11/2018   Lab Results  Component Value Date   CHOL 161 02/11/2018   Lab Results  Component Value Date   HDL 68.90 02/11/2018   Lab Results  Component Value Date   LDLCALC 71 02/11/2018   Lab Results  Component Value Date   TRIG 105.0 02/11/2018   Lab Results  Component Value Date    CHOLHDL 2 02/11/2018   Lab Results  Component Value Date   HGBA1C 6.0 02/11/2018       Assessment & Plan:   Problem List Items Addressed This Visit    Rheumatoid arthritis ([place])    Was off of her biologics for a period of time and her pain has flared. She is working with rheumatology to try and get her pain back under control and will allow her a small increase in her Percocet use this month.       Relevant Medications   oxyCODONE-acetaminophen (PERCOCET) 10-325 MG tablet   LOW BACK PAIN    Encouraged to stay as active as is tolerable      Relevant Medications   oxyCODONE-acetaminophen (PERCOCET) 10-325 MG tablet   Hypertension     no changes to meds. Encouraged heart healthy diet such as the DASH diet and exercise as tolerated.       Pain   Relevant Medications   oxyCODONE-acetaminophen (PERCOCET) 10-325 MG tablet   Hyperglycemia    minimize simple carbs. Increase exercise as tolerated.        Other Visit Diagnoses    Back pain, unspecified back location, unspecified back pain laterality, unspecified chronicity       Relevant Medications   oxyCODONE-acetaminophen (PERCOCET) 10-325 MG tablet      I have discontinued Latice M. Burbage's meloxicam. I am also having her maintain her predniSONE, Baricitinib, predniSONE, albuterol, diltiazem, losartan, hydrochlorothiazide, citalopram, venlafaxine XR, gabapentin, fentaNYL, and oxyCODONE-acetaminophen.  Meds ordered this encounter  Medications  . oxyCODONE-acetaminophen (PERCOCET) 10-325 MG tablet    Sig: Take 2 tablets by mouth every 4 (four) hours as needed for pain.    Dispense:  180 tablet    Refill:  0   I discussed the assessment and treatment plan with the patient. The patient was provided an opportunity to ask questions and all were answered. The patient agreed with the plan and demonstrated an understanding of the instructions.   The patient was advised to call back or seek an in-person evaluation if the symptoms  worsen or if the condition fails to improve as anticipated.  I provided 25 minutes of non-face-to-face time during this encounter.   Penni Homans, MD

## 2018-11-04 NOTE — Assessment & Plan Note (Signed)
minimize simple carbs. Increase exercise as tolerated.  

## 2018-11-04 NOTE — Assessment & Plan Note (Signed)
Was off of her biologics for a period of time and her pain has flared. She is working with rheumatology to try and get her pain back under control and will allow her a small increase in her Percocet use this month.

## 2018-11-26 ENCOUNTER — Telehealth: Payer: Self-pay | Admitting: Family Medicine

## 2018-11-26 ENCOUNTER — Other Ambulatory Visit: Payer: Self-pay | Admitting: Family Medicine

## 2018-11-26 DIAGNOSIS — M069 Rheumatoid arthritis, unspecified: Secondary | ICD-10-CM

## 2018-11-26 DIAGNOSIS — R52 Pain, unspecified: Secondary | ICD-10-CM

## 2018-11-26 DIAGNOSIS — M549 Dorsalgia, unspecified: Secondary | ICD-10-CM

## 2018-11-26 MED ORDER — FENTANYL 75 MCG/HR TD PT72: 1.0000 | MEDICATED_PATCH | TRANSDERMAL | 0 refills | Status: DC

## 2018-11-26 MED ORDER — OXYCODONE-ACETAMINOPHEN 10-325 MG PO TABS: 2.0000 | ORAL_TABLET | ORAL | 0 refills | Status: DC | PRN

## 2018-11-26 NOTE — Telephone Encounter (Signed)
The medications below need to go to two different pharmacies /  Pt needs refill on oxyCODONE-acetaminophen (PERCOCET) 10-325 MG tablet To go to   [facility] # [id number] - [place], [place]. [phone] (Phone) [phone] (Fax)   And she needs refill for fentaNYL ([place]) 75 MCG/HR Sent to   [facility] #[id number] [facility] [place] - [place] AT [place] [phone] (Phone) [phone] (Fax)

## 2018-11-26 NOTE — Telephone Encounter (Signed)
Done

## 2018-11-29 ENCOUNTER — Telehealth: Payer: Self-pay

## 2018-11-29 NOTE — Telephone Encounter (Signed)
PA initiated via Covermymeds; KEY: [id number]. PA approved.   PA Case: [id number], Status: Approved, Coverage Starts on: 11/29/2018 12:00:00 AM, Coverage Ends on: 05/28/2019 12:00:00 AM

## 2018-12-18 ENCOUNTER — Other Ambulatory Visit: Payer: Self-pay | Admitting: Family Medicine

## 2018-12-23 ENCOUNTER — Other Ambulatory Visit: Payer: Self-pay | Admitting: Family Medicine

## 2018-12-23 ENCOUNTER — Telehealth: Payer: Self-pay | Admitting: Family Medicine

## 2018-12-23 DIAGNOSIS — R52 Pain, unspecified: Secondary | ICD-10-CM

## 2018-12-23 DIAGNOSIS — M069 Rheumatoid arthritis, unspecified: Secondary | ICD-10-CM

## 2018-12-23 MED ORDER — OXYCODONE-ACETAMINOPHEN 10-325 MG PO TABS: 2.0000 | ORAL_TABLET | ORAL | 0 refills | Status: DC | PRN

## 2018-12-23 NOTE — Telephone Encounter (Signed)
Please advise 

## 2018-12-23 NOTE — Telephone Encounter (Signed)
Relation to pt: self  Call back number: [phone] Pharmacy: [facility] # [address], [place]. [phone] (Phone) [phone] (Fax)     Reason for call:  Patient requesting oxyCODONE-acetaminophen (PERCOCET) 10-325 MG tablet Quantity: 180 tablet, patient aware please allow 48 to 72 hour turn around time

## 2018-12-23 NOTE — Telephone Encounter (Signed)
I have refilled 

## 2018-12-31 ENCOUNTER — Telehealth: Payer: Self-pay | Admitting: Family Medicine

## 2018-12-31 ENCOUNTER — Other Ambulatory Visit: Payer: Self-pay | Admitting: Family Medicine

## 2018-12-31 DIAGNOSIS — M549 Dorsalgia, unspecified: Secondary | ICD-10-CM

## 2018-12-31 MED ORDER — FENTANYL 75 MCG/HR TD PT72: 1.0000 | MEDICATED_PATCH | TRANSDERMAL | 0 refills | Status: DC

## 2018-12-31 NOTE — Telephone Encounter (Signed)
Requesting: fentanyl Contract: 07/06/17 UDS:02/11/18 Last OV: 11/01/18 Next OV: 02/27/19 Last Refill: 11/26/18 Database:   Please advise

## 2018-12-31 NOTE — Telephone Encounter (Signed)
Pt request refill  fentaNYL ([place]) 75 MCG/HR   [facility][id number] - [place], [place] AT [place] [phone] (Phone) [phone] (Fax)

## 2018-12-31 NOTE — Telephone Encounter (Signed)
Sent to pharmacy 

## 2019-01-01 ENCOUNTER — Telehealth: Payer: Self-pay

## 2019-01-01 NOTE — Telephone Encounter (Signed)
PA initiated via Covermymeds; KEY: [id number]. PA approved.   PA Case: [id number], Status: Approved, Coverage Starts on: 01/01/2019 12:00:00 AM, Coverage Ends on: 06/30/2019 12:00:00 AM.

## 2019-01-01 NOTE — Telephone Encounter (Signed)
Patient notified

## 2019-01-20 ENCOUNTER — Other Ambulatory Visit: Payer: Self-pay | Admitting: Family Medicine

## 2019-01-20 DIAGNOSIS — M069 Rheumatoid arthritis, unspecified: Secondary | ICD-10-CM

## 2019-01-20 DIAGNOSIS — R52 Pain, unspecified: Secondary | ICD-10-CM

## 2019-01-20 MED ORDER — OXYCODONE-ACETAMINOPHEN 10-325 MG PO TABS: 2.0000 | ORAL_TABLET | ORAL | 0 refills | Status: DC | PRN

## 2019-01-20 NOTE — Telephone Encounter (Signed)
RX REFILL oxyCODONE-acetaminophen (PERCOCET) 10-325 MG table PHARMACY [facility] # [id number] - [place], [place] [phone] (Phone) [phone] (Fax

## 2019-01-21 ENCOUNTER — Encounter: Payer: Self-pay | Admitting: Family Medicine

## 2019-01-21 ENCOUNTER — Ambulatory Visit (INDEPENDENT_AMBULATORY_CARE_PROVIDER_SITE_OTHER): Payer: BLUE CROSS/BLUE SHIELD | Admitting: Family Medicine

## 2019-01-21 ENCOUNTER — Other Ambulatory Visit: Payer: Self-pay

## 2019-01-21 DIAGNOSIS — H9201 Otalgia, right ear: Secondary | ICD-10-CM

## 2019-01-21 MED ORDER — CEPHALEXIN 500 MG PO CAPS: 500.0000 mg | ORAL_CAPSULE | Freq: Three times a day (TID) | ORAL | 0 refills | Status: AC

## 2019-01-21 NOTE — Progress Notes (Signed)
Chief Complaint  Patient presents with  . Ear Pain    2 days    Jennifer Kelly here for R ear pain. Due to COVID-19 pandemic, we are interacting via web portal for an electronic face-to-face visit. I verified patient's ID using 2 identifiers. Patient agreed to proceed with visit via this method. Patient is at home, I am at office. Patient and I are present for visit.   Duration: 2 days  Associated symptoms: R ear pain Denies: sinus congestion, sinus pain, rhinorrhea, ear drainage, sore throat and fever Treatment to date: none Does not chew gum or have hx of TMJ syndrome Feels warm inside her ear.   ROS:  Const: Denies fevers HEENT: As noted in HPI  Past Medical History:  Diagnosis Date  . Acute bronchitis 01/05/2014  . Arthritis   . Chicken pox as a child  . Dermatitis 09/09/2014  . Ganglion cyst of left foot 09/09/2014  . H/O mumps   . H/O tobacco use, presenting hazards to health 10/08/2009   Qualifier: Diagnosis of  By: Linda Hedges MD, Heinz Knuckles  Last cigarette 02/16/2016   . History of chicken pox   . Hyperglycemia 11/30/2015  . Hypertension   . Morbid obesity ([place]) 08/31/2009   Qualifier: Diagnosis of  By: Linda Hedges MD, Heinz Knuckles  BMI 34.4(Sept '13) BMI 34 (March '15)   . Mumps as a child  . Osteopenia 01/02/2017  . Osteoporosis 11/20/2016  . Psoriatic arthritis ([place])   . Rheumatoid arthritis ([place])   . Shingles 2014   Exam No conversational dyspnea Age appropriate judgment and insight Nml affect and mood  Right ear pain - Plan: cephALEXin (KEFLEX) 500 MG capsule  Orders as above. Let me know Mon if no better, would consider drops for AOE, if no better after that I would request she come in for OV so we can look in her ear.  Pt voiced understanding and agreement to the plan.  [place], DO 01/21/19 2:25 PM

## 2019-01-27 ENCOUNTER — Other Ambulatory Visit: Payer: Self-pay | Admitting: Family Medicine

## 2019-01-30 ENCOUNTER — Other Ambulatory Visit: Payer: Self-pay | Admitting: Family Medicine

## 2019-01-30 DIAGNOSIS — M549 Dorsalgia, unspecified: Secondary | ICD-10-CM

## 2019-01-30 MED ORDER — FENTANYL 75 MCG/HR TD PT72: 1.0000 | MEDICATED_PATCH | TRANSDERMAL | 0 refills | Status: DC

## 2019-01-30 NOTE — Telephone Encounter (Signed)
Requested medication (s) are due for refill today: yes  Requested medication (s) are on the active medication list: yes  Last refill:  12/31/2018  Future visit scheduled: yes  Notes to clinic: This refill cannot be delegated   Requested Prescriptions  Pending Prescriptions Disp Refills   fentaNYL (DURAGESIC) 75 MCG/HR 10 patch 0    Sig: Place 1 patch onto the skin every 3 (three) days.     Not Delegated - Analgesics:  Opioid Agonists Failed - 01/30/2019  1:01 PM      Failed - This refill cannot be delegated      Passed - Urine Drug Screen completed in last 360 days.      Passed - Valid encounter within last 6 months    Recent Outpatient Visits          1 week ago Right ear pain   [facility] at [place], [place]   3 months ago Back pain, unspecified back location, unspecified back pain laterality, unspecified chronicity   Archivist at [place], [place]   5 months ago Back pain, unspecified back location, unspecified back pain laterality, unspecified chronicity   Archivist at [place], [place]   11 months ago Encounter for long-term (current) use of medications   Archivist at [place], [place]   1 year ago Back pain, unspecified back location, unspecified back pain laterality, unspecified chronicity   Archivist at [place], [place]      Future Appointments            In 4 weeks Mosie Lukes, MD [place] at [place]

## 2019-01-30 NOTE — Telephone Encounter (Signed)
Medication Refill - Medication: fentaNYL ([place]) 75 MCG/HR   Has the patient contacted their pharmacy? Yes.   (Agent: If no, request that the patient contact the pharmacy for the refill.) (Agent: If yes, when and what did the pharmacy advise?)  Preferred Pharmacy (with phone number or street name):  [facility] [id number] [facility], [place] - [address] AT [address]  [place] [facility] [place] [address]  Phone: [phone] Fax: [phone]     Agent: Please be advised that RX refills may take up to 3 business days. We ask that you follow-up with your pharmacy.

## 2019-02-13 ENCOUNTER — Other Ambulatory Visit: Payer: Self-pay | Admitting: Family Medicine

## 2019-02-13 DIAGNOSIS — R52 Pain, unspecified: Secondary | ICD-10-CM

## 2019-02-13 DIAGNOSIS — M069 Rheumatoid arthritis, unspecified: Secondary | ICD-10-CM

## 2019-02-13 MED ORDER — OXYCODONE-ACETAMINOPHEN 10-325 MG PO TABS: 2.0000 | ORAL_TABLET | ORAL | 0 refills | Status: DC | PRN

## 2019-02-13 NOTE — Telephone Encounter (Signed)
Medication Refill:  oxyCODONE-acetaminophen (PERCOCET) 10-325 MG tablet [id number]      Pharmacy:  [facility] Cl[facility] # [address], [place] [phone] (Phone) [phone] (Fax)   Pt aware of turn around time

## 2019-02-13 NOTE — Telephone Encounter (Signed)
Requested medication (s) are due for refill today: yes  Requested medication (s) are on the active medication list: yes  Last refill:  01/20/2019  Future visit scheduled: yes  Notes to clinic:  Refill cannot be delegated   Requested Prescriptions  Pending Prescriptions Disp Refills   oxyCODONE-acetaminophen (PERCOCET) 10-325 MG tablet 180 tablet 0    Sig: Take 2 tablets by mouth every 4 (four) hours as needed for pain.     Not Delegated - Analgesics:  Opioid Agonist Combinations Failed - 02/13/2019 12:49 PM      Failed - This refill cannot be delegated      Failed - Urine Drug Screen completed in last 360 days.      Passed - Valid encounter within last 6 months    Recent Outpatient Visits          3 weeks ago Right ear pain   Archivist at [place], [place]   3 months ago Back pain, unspecified back location, unspecified back pain laterality, unspecified chronicity   Archivist at [place], [place]   5 months ago Back pain, unspecified back location, unspecified back pain laterality, unspecified chronicity   Archivist at [place], [place]   1 year ago Encounter for long-term (current) use of medications   Archivist at [place], [place]   1 year ago Back pain, unspecified back location, unspecified back pain laterality, unspecified chronicity   Archivist at [place], [place]      Future Appointments            In 2 weeks Mosie Lukes, MD [place] at [place]

## 2019-02-15 ENCOUNTER — Other Ambulatory Visit: Payer: Self-pay | Admitting: Family Medicine

## 2019-02-27 ENCOUNTER — Ambulatory Visit: Payer: BLUE CROSS/BLUE SHIELD | Admitting: Family Medicine

## 2019-02-28 ENCOUNTER — Other Ambulatory Visit: Payer: Self-pay

## 2019-02-28 ENCOUNTER — Ambulatory Visit (INDEPENDENT_AMBULATORY_CARE_PROVIDER_SITE_OTHER): Payer: BLUE CROSS/BLUE SHIELD | Admitting: Family Medicine

## 2019-02-28 DIAGNOSIS — R739 Hyperglycemia, unspecified: Secondary | ICD-10-CM | POA: Diagnosis not present

## 2019-02-28 DIAGNOSIS — M05741 Rheumatoid arthritis with rheumatoid factor of right hand without organ or systems involvement: Secondary | ICD-10-CM

## 2019-02-28 DIAGNOSIS — M549 Dorsalgia, unspecified: Secondary | ICD-10-CM | POA: Diagnosis not present

## 2019-02-28 DIAGNOSIS — I10 Essential (primary) hypertension: Secondary | ICD-10-CM

## 2019-02-28 DIAGNOSIS — M05742 Rheumatoid arthritis with rheumatoid factor of left hand without organ or systems involvement: Secondary | ICD-10-CM

## 2019-02-28 MED ORDER — FLUCONAZOLE 150 MG PO TABS: 150.0000 mg | ORAL_TABLET | Freq: Once | ORAL | 1 refills | Status: DC

## 2019-02-28 MED ORDER — DILTIAZEM HCL ER BEADS 240 MG PO CP24: 240.0000 mg | ORAL_CAPSULE | Freq: Every day | ORAL | 1 refills | Status: DC

## 2019-02-28 MED ORDER — CITALOPRAM HYDROBROMIDE 20 MG PO TABS: 20.0000 mg | ORAL_TABLET | Freq: Every day | ORAL | 1 refills | Status: DC

## 2019-02-28 MED ORDER — LOSARTAN POTASSIUM 50 MG PO TABS: 50.0000 mg | ORAL_TABLET | Freq: Every day | ORAL | 1 refills | Status: DC

## 2019-02-28 MED ORDER — FENTANYL 75 MCG/HR TD PT72: 1.0000 | MEDICATED_PATCH | TRANSDERMAL | 0 refills | Status: DC

## 2019-02-28 MED ORDER — VENLAFAXINE HCL ER 75 MG PO CP24: ORAL_CAPSULE | ORAL | 1 refills | Status: DC

## 2019-02-28 MED ORDER — HYDROCHLOROTHIAZIDE 12.5 MG PO CAPS: 12.5000 mg | ORAL_CAPSULE | Freq: Every day | ORAL | 1 refills | Status: DC

## 2019-03-02 NOTE — Assessment & Plan Note (Signed)
hgba1c acceptable, minimize simple carbs. Increase exercise as tolerated.  

## 2019-03-02 NOTE — Progress Notes (Signed)
Virtual Visit via Video Note  I connected with Jennifer Kelly on 02/25/19 at 10:00 AM EDT by a video enabled telemedicine application and verified that I am speaking with the correct person using two identifiers.  Location: Patient: home Provider: [facility]   I discussed the limitations of evaluation and management by telemedicine and the availability of in person appointments. The patient expressed understanding and agreed to proceed.   Subjective:    Patient ID: Jennifer Kelly, female    DOB: 26-Jan-1961, 58 y.o.   MRN: [id number]  No chief complaint on file.   HPI Patient is in today for follow up on chronic medical concerns including hypertension, hyperglycemia and chronic pain. She continues to work but has pain daily. No recent febrile illness or trauma. No falls or incontinence. No polyuria or polydipsia. Denies CP/palp/SOB/HA/congestion/fevers/GI or GU c/o. Taking meds as prescribed  Past Medical History:  Diagnosis Date  . Acute bronchitis 01/05/2014  . Arthritis   . Chicken pox as a child  . Dermatitis 09/09/2014  . Ganglion cyst of left foot 09/09/2014  . H/O mumps   . H/O tobacco use, presenting hazards to health 10/08/2009   Qualifier: Diagnosis of  By: Linda Hedges MD, Heinz Knuckles  Last cigarette 02/16/2016   . History of chicken pox   . Hyperglycemia 11/30/2015  . Hypertension   . Morbid obesity ([place]) 08/31/2009   Qualifier: Diagnosis of  By: Linda Hedges MD, Heinz Knuckles  BMI 34.4(Sept '13) BMI 34 (March '15)   . Mumps as a child  . Osteopenia 01/02/2017  . Osteoporosis 11/20/2016  . Psoriatic arthritis ([place])   . Rheumatoid arthritis ([place])   . Shingles 2014    Past Surgical History:  Procedure Laterality Date  . BACK SURGERY  2003  . NECK SURGERY  02/17/2016   C4, C5 & C6  . rotater cuff  20 yrs ago   left shoulder  . TOOTH EXTRACTION  05/2012  . WISDOM TOOTH EXTRACTION  58 yrs old    Family History  Problem Relation Age of Onset  . Parkinson's disease Mother   . Heart disease  Mother   . Hypertension Mother   . Diabetes Father        type 2  . Hypertension Father   . Stroke Father   . Vascular Disease Brother   . Hypertension Brother   . Diabetes Brother   . Heart attack Paternal Uncle     Social History   Socioeconomic History  . Marital status: Divorced    Spouse name: Not on file  . Number of children: 0  . Years of education: Not on file  . Highest education level: Not on file  Occupational History  . Occupation: help Therapist, sports  Social Needs  . Financial resource strain: Not on file  . Food insecurity    Worry: Not on file    Inability: Not on file  . Transportation needs    Medical: Not on file    Non-medical: Not on file  Tobacco Use  . Smoking status: Former Smoker    Start date: 11/30/2015  . Smokeless tobacco: Never Used  Substance and Sexual Activity  . Alcohol use: Yes    Comment: occ wine  . Drug use: No  . Sexual activity: Never    Comment: no dietary restrictions. lives alone  Lifestyle  . Physical activity    Days per week: Not on file    Minutes per session: Not on file  . Stress:  Not on file  Relationships  . Social Herbalist on phone: Not on file    Gets together: Not on file    Attends religious service: Not on file    Active member of club or organization: Not on file    Attends meetings of clubs or organizations: Not on file    Relationship status: Not on file  . Intimate partner violence    Fear of current or ex partner: Not on file    Emotionally abused: Not on file    Physically abused: Not on file    Forced sexual activity: Not on file  Other Topics Concern  . Not on file  Social History Narrative  . Not on file    Outpatient Medications Prior to Visit  Medication Sig Dispense Refill  . albuterol (VENTOLIN HFA) 108 (90 Base) MCG/ACT inhaler INHALE 2 PUFFS BY MOUTH EVERY 6 HOURS AS NEEDED FOR WHEEZING 18 g 5  . Baricitinib (OLUMIANT) 2 MG TABS Take 2 mg by mouth daily. 30 tablet 0   . gabapentin (NEURONTIN) 600 MG tablet TAKE 1 TABLET(600 MG) BY MOUTH THREE TIMES DAILY 90 tablet 1  . oxyCODONE-acetaminophen (PERCOCET) 10-325 MG tablet Take 2 tablets by mouth every 4 (four) hours as needed for pain. 180 tablet 0  . predniSONE (DELTASONE) 10 MG tablet Take 1 tablet (10 mg total) by mouth daily as needed. 30 tablet 0  . predniSONE (DELTASONE) 5 MG tablet Take 20 mg by mouth daily.   0  . citalopram (CELEXA) 20 MG tablet Take 1 tablet (20 mg total) by mouth daily. 30 tablet 5  . diltiazem (TIAZAC) 240 MG 24 hr capsule Take 1 capsule (240 mg total) by mouth daily. 90 capsule 1  . fentaNYL (DURAGESIC) 75 MCG/HR Place 1 patch onto the skin every 3 (three) days. 10 patch 0  . hydrochlorothiazide (MICROZIDE) 12.5 MG capsule Take 1 capsule (12.5 mg total) by mouth daily. 90 capsule 1  . losartan (COZAAR) 50 MG tablet Take 1 tablet (50 mg total) by mouth daily. 90 tablet 1  . venlafaxine XR (EFFEXOR-XR) 75 MG 24 hr capsule Take 1 capsule po daily 90 capsule 1   No facility-administered medications prior to visit.     Allergies  Allergen Reactions  . Simponi [Golimumab] Rash    States that it was systemic    Review of Systems  Constitutional: Positive for malaise/fatigue. Negative for fever.  HENT: Negative for congestion.   Eyes: Negative for blurred vision.  Respiratory: Negative for shortness of breath.   Cardiovascular: Negative for chest pain, palpitations and leg swelling.  Gastrointestinal: Negative for abdominal pain, blood in stool and nausea.  Genitourinary: Negative for dysuria and frequency.  Musculoskeletal: Positive for back pain and neck pain. Negative for falls.  Skin: Negative for rash.  Neurological: Negative for dizziness, loss of consciousness and headaches.  Endo/Heme/Allergies: Negative for environmental allergies.  Psychiatric/Behavioral: Positive for depression. The patient is nervous/anxious.        Objective:    Physical Exam Constitutional:       Appearance: Normal appearance. She is not ill-appearing.  HENT:     Head: Normocephalic and atraumatic.     Nose: Nose normal.  Pulmonary:     Effort: Pulmonary effort is normal.  Musculoskeletal:        General: No tenderness.  Neurological:     Mental Status: She is alert and oriented to person, place, and time.  Psychiatric:  Mood and Affect: Mood normal.        Behavior: Behavior normal.     Wt 239 lb (108.4 kg)   BMI 32.41 kg/m  Wt Readings from Last 3 Encounters:  02/28/19 239 lb (108.4 kg)  02/11/18 238 lb (108 kg)  08/09/17 245 lb 12.8 oz (111.5 kg)    Diabetic Foot Exam - Simple   No data filed     Lab Results  Component Value Date   WBC 10.5 02/11/2018   HGB 14.4 02/11/2018   HCT 42.5 02/11/2018   PLT 243.0 02/11/2018   GLUCOSE 85 02/11/2018   CHOL 161 02/11/2018   TRIG 105.0 02/11/2018   HDL 68.90 02/11/2018   LDLCALC 71 02/11/2018   ALT 20 02/11/2018   AST 16 02/11/2018   NA 138 02/11/2018   K 3.8 02/11/2018   CL 98 02/11/2018   CREATININE 0.63 02/11/2018   BUN 20 02/11/2018   CO2 32 02/11/2018   TSH 1.07 02/11/2018   HGBA1C 6.0 02/11/2018    Lab Results  Component Value Date   TSH 1.07 02/11/2018   Lab Results  Component Value Date   WBC 10.5 02/11/2018   HGB 14.4 02/11/2018   HCT 42.5 02/11/2018   MCV 90.6 02/11/2018   PLT 243.0 02/11/2018   Lab Results  Component Value Date   NA 138 02/11/2018   K 3.8 02/11/2018   CO2 32 02/11/2018   GLUCOSE 85 02/11/2018   BUN 20 02/11/2018   CREATININE 0.63 02/11/2018   BILITOT 0.6 02/11/2018   ALKPHOS 84 02/11/2018   AST 16 02/11/2018   ALT 20 02/11/2018   PROT 7.6 02/11/2018   ALBUMIN 4.2 02/11/2018   CALCIUM 9.4 02/11/2018   ANIONGAP 8 11/26/2014   GFR 103.01 02/11/2018   Lab Results  Component Value Date   CHOL 161 02/11/2018   Lab Results  Component Value Date   HDL 68.90 02/11/2018   Lab Results  Component Value Date   LDLCALC 71 02/11/2018   Lab Results   Component Value Date   TRIG 105.0 02/11/2018   Lab Results  Component Value Date   CHOLHDL 2 02/11/2018   Lab Results  Component Value Date   HGBA1C 6.0 02/11/2018       Assessment & Plan:   Problem List Items Addressed This Visit    Morbid obesity ([place])    Encouraged DASH diet, decrease po intake and increase exercise as tolerated. Needs 7-8 hours of sleep nightly. Avoid trans fats, eat small, frequent meals every 4-5 hours with lean proteins, complex carbs and healthy fats. Minimize simple carbs, consider WW APP      Rheumatoid arthritis ([place])    Struggling with daily, diffuse pain is following with rheumatology      Relevant Medications   fentaNYL (DURAGESIC) 75 MCG/HR   Hypertension    Monitor and report any concerns no changes to meds. Encouraged heart healthy diet such as the DASH diet and exercise as tolerated.       Relevant Medications   diltiazem (TIAZAC) 240 MG 24 hr capsule   hydrochlorothiazide (MICROZIDE) 12.5 MG capsule   losartan (COZAAR) 50 MG tablet   Hyperglycemia    hgba1c acceptable, minimize simple carbs. Increase exercise as tolerated.        Other Visit Diagnoses    Back pain, unspecified back location, unspecified back pain laterality, unspecified chronicity       Relevant Medications   fentaNYL (DURAGESIC) 75 MCG/HR  I am having Akita M. Dekker start on fluconazole. I am also having her maintain her predniSONE, Baricitinib, predniSONE, albuterol, oxyCODONE-acetaminophen, gabapentin, venlafaxine XR, diltiazem, citalopram, hydrochlorothiazide, losartan, and fentaNYL.  Meds ordered this encounter  Medications  . fluconazole (DIFLUCAN) 150 MG tablet    Sig: Take 1 tablet (150 mg total) by mouth once for 1 dose.    Dispense:  4 tablet    Refill:  1  . venlafaxine XR (EFFEXOR-XR) 75 MG 24 hr capsule    Sig: Take 1 capsule po daily    Dispense:  90 capsule    Refill:  1  . diltiazem (TIAZAC) 240 MG 24 hr capsule    Sig: Take 1 capsule  (240 mg total) by mouth daily.    Dispense:  90 capsule    Refill:  1  . citalopram (CELEXA) 20 MG tablet    Sig: Take 1 tablet (20 mg total) by mouth daily.    Dispense:  90 tablet    Refill:  1  . hydrochlorothiazide (MICROZIDE) 12.5 MG capsule    Sig: Take 1 capsule (12.5 mg total) by mouth daily.    Dispense:  90 capsule    Refill:  1  . losartan (COZAAR) 50 MG tablet    Sig: Take 1 tablet (50 mg total) by mouth daily.    Dispense:  90 tablet    Refill:  1  . fentaNYL (DURAGESIC) 75 MCG/HR    Sig: Place 1 patch onto the skin every 3 (three) days.    Dispense:  10 patch    Refill:  0      I discussed the assessment and treatment plan with the patient. The patient was provided an opportunity to ask questions and all were answered. The patient agreed with the plan and demonstrated an understanding of the instructions.   The patient was advised to call back or seek an in-person evaluation if the symptoms worsen or if the condition fails to improve as anticipated.  I provided 25 minutes of non-face-to-face time during this encounter.   Penni Homans, MD

## 2019-03-02 NOTE — Assessment & Plan Note (Signed)
Encouraged DASH diet, decrease po intake and increase exercise as tolerated. Needs 7-8 hours of sleep nightly. Avoid trans fats, eat small, frequent meals every 4-5 hours with lean proteins, complex carbs and healthy fats. Minimize simple carbs, consider WW APP 

## 2019-03-02 NOTE — Assessment & Plan Note (Signed)
Monitor and report any concerns. no changes to meds. Encouraged heart healthy diet such as the DASH diet and exercise as tolerated.  

## 2019-03-02 NOTE — Assessment & Plan Note (Signed)
Struggling with daily, diffuse pain is following with rheumatology

## 2019-03-11 ENCOUNTER — Other Ambulatory Visit: Payer: Self-pay | Admitting: Family Medicine

## 2019-03-11 DIAGNOSIS — R52 Pain, unspecified: Secondary | ICD-10-CM

## 2019-03-11 DIAGNOSIS — M069 Rheumatoid arthritis, unspecified: Secondary | ICD-10-CM

## 2019-03-11 MED ORDER — OXYCODONE-ACETAMINOPHEN 10-325 MG PO TABS: 2.0000 | ORAL_TABLET | ORAL | 0 refills | Status: DC | PRN

## 2019-03-11 NOTE — Telephone Encounter (Signed)
Requesting:PERCOCET  Contract:yes [id number] one  Last OV:02/28/19 Next OV:n/a Last Refill:02/13/19  #180-0rf Database:   Please advise

## 2019-03-11 NOTE — Telephone Encounter (Signed)
Medication Refill - Medication: oxyCODONE-acetaminophen (PERCOCET) 10-325 MG tablet [id number]   Has the patient contacted their pharmacy? No. (Agent: If no, request that the patient contact the pharmacy for the refill.) (Agent: If yes, when and what did the pharmacy advise?)  Preferred Pharmacy (with phone number or street name):  [id number] - [place], [place]. [phone] (Phone     Agent: Please be advised that RX refills may take up to 3 business days. We ask that you follow-up with your pharmacy.

## 2019-03-12 ENCOUNTER — Telehealth: Payer: Self-pay

## 2019-03-12 NOTE — Telephone Encounter (Signed)
PA initiated via Covermymeds; KEY: [id number]. PA approved.   PA Case: [id number], Status: Approved, Coverage Starts on: 03/12/2019 12:00:00 AM, Coverage Ends on: 09/08/2019 12:00:00 AM

## 2019-03-13 ENCOUNTER — Telehealth: Payer: Self-pay | Admitting: Family Medicine

## 2019-03-13 DIAGNOSIS — R52 Pain, unspecified: Secondary | ICD-10-CM

## 2019-03-13 NOTE — Telephone Encounter (Signed)
Pt called and is requesting to have her oxycodone sent to [facility] instead. Please advise.    [facility] # [id number] - [place], [place] - [address]  [id number] [facility] [place] [address]  Phone: [phone] Fax: [phone]  Not a 24 hour pharmacy; exact hours not known.

## 2019-03-13 NOTE — Telephone Encounter (Signed)
Can you please resend to [facility] in [place]?   The one at [facility] has been canceled by patient.

## 2019-03-13 NOTE — Telephone Encounter (Signed)
Requested medication (s) are due for refill today:yes  Requested medication (s) are on the active medication list: yes  Last refill: 03/11/2019  Future visit scheduled: no  Notes to clinic: Patient would like medication to be sent to [facility]   Requested Prescriptions  Pending Prescriptions Disp Refills   oxyCODONE-acetaminophen (PERCOCET) 10-325 MG tablet 180 tablet 0    Sig: Take 2 tablets by mouth every 4 (four) hours as needed for pain.     Not Delegated - Analgesics:  Opioid Agonist Combinations Failed - 03/13/2019  1:00 PM      Failed - This refill cannot be delegated      Failed - Urine Drug Screen completed in last 360 days.      Passed - Valid encounter within last 6 months    Recent Outpatient Visits          1 week ago Essential hypertension   [facility] at [place], [place]   1 month ago Right ear pain   Archivist at [place], [place]   4 months ago Back pain, unspecified back location, unspecified back pain laterality, unspecified chronicity   Archivist at [employer] [facility], [place]   6 months ago Back pain, unspecified back location, unspecified back pain laterality, unspecified chronicity   Archivist at [place], [place]   1 year ago Encounter for long-term (current) use of medications   Archivist at [place], Bonnita Levan, MD

## 2019-03-16 ENCOUNTER — Other Ambulatory Visit: Payer: Self-pay | Admitting: Family Medicine

## 2019-03-16 DIAGNOSIS — R52 Pain, unspecified: Secondary | ICD-10-CM

## 2019-03-16 MED ORDER — OXYCODONE-ACETAMINOPHEN 10-325 MG PO TABS: 2.0000 | ORAL_TABLET | ORAL | 0 refills | Status: DC | PRN

## 2019-03-16 NOTE — Telephone Encounter (Signed)
Have sent in to [facility]

## 2019-03-28 ENCOUNTER — Other Ambulatory Visit: Payer: Self-pay | Admitting: Family Medicine

## 2019-03-28 DIAGNOSIS — M549 Dorsalgia, unspecified: Secondary | ICD-10-CM

## 2019-03-28 MED ORDER — FENTANYL 75 MCG/HR TD PT72: 1.0000 | MEDICATED_PATCH | TRANSDERMAL | 0 refills | Status: DC

## 2019-03-28 NOTE — Telephone Encounter (Signed)
Medication Refill - Medication: fentaNYL ([place]) 75 MCG/HR  Has the patient contacted their pharmacy?  yes (Agent: If no, request that the patient contact the pharmacy for the refill.) (Agent: If yes, when and what did the pharmacy advise?)  Preferred Pharmacy (with phone number or street name):  [facility] [id number] [facility], [place] AT [place] [phone] (Phone) [phone] (Fax)   Agent: Please be advised that RX refills may take up to 3 business days. We ask that you follow-up with your pharmacy.

## 2019-04-08 ENCOUNTER — Telehealth: Payer: Self-pay | Admitting: Family Medicine

## 2019-04-08 NOTE — Telephone Encounter (Signed)
Medication Refill - Medication:  oxyCODONE-acetaminophen (PERCOCET) 10-325 MG tablet [id number]   Has the patient contacted their pharmacy? No. (Agent: If no, request that the patient contact the pharmacy for the refill.) (Agent: If yes, when and what did the pharmacy advise?)  Preferred Pharmacy (with phone number or street name):  [facility] [id number], [place]. [phone] (Phone) [phone] (Fax)     Agent: Please be advised that RX refills may take up to 3 business days. We ask that you follow-up with your pharmacy.

## 2019-04-09 ENCOUNTER — Other Ambulatory Visit: Payer: Self-pay | Admitting: Family Medicine

## 2019-04-09 DIAGNOSIS — R52 Pain, unspecified: Secondary | ICD-10-CM

## 2019-04-09 MED ORDER — OXYCODONE-ACETAMINOPHEN 10-325 MG PO TABS: 2.0000 | ORAL_TABLET | ORAL | 0 refills | Status: DC | PRN

## 2019-04-09 NOTE — Telephone Encounter (Signed)
Last written: 03/16/19 Last ov: 02/28/19 Next ov: nothing scheduled Contract: due UDS: due

## 2019-04-09 NOTE — Telephone Encounter (Signed)
Pt called in this yesterday and was just checking to see if refilled

## 2019-04-29 ENCOUNTER — Telehealth: Payer: Self-pay | Admitting: Family Medicine

## 2019-04-29 ENCOUNTER — Other Ambulatory Visit: Payer: Self-pay | Admitting: Family Medicine

## 2019-04-29 DIAGNOSIS — M549 Dorsalgia, unspecified: Secondary | ICD-10-CM

## 2019-04-29 MED ORDER — FENTANYL 75 MCG/HR TD PT72: 1.0000 | MEDICATED_PATCH | TRANSDERMAL | 0 refills | Status: DC

## 2019-04-29 NOTE — Telephone Encounter (Signed)
Medication Refill - Medication: fentaNYL ([place]) 75 MCG/HR    Preferred Pharmacy (with phone number or street name):  [facility] [id number] [facility], [place] AT [place] & [place] [phone] (Phone) [phone] (Fax)     Agent: Please be advised that RX refills may take up to 3 business days. We ask that you follow-up with your pharmacy.

## 2019-05-01 NOTE — Telephone Encounter (Signed)
Medication sent in on 04/29/19

## 2019-05-06 ENCOUNTER — Other Ambulatory Visit: Payer: Self-pay | Admitting: Family Medicine

## 2019-05-06 DIAGNOSIS — R52 Pain, unspecified: Secondary | ICD-10-CM

## 2019-05-06 NOTE — Telephone Encounter (Signed)
Medication Refill - Medication: oxyCODONE-acetaminophen (PERCOCET) 10-325 MG tablet    Has the patient contacted their pharmacy? Yes.   (Agent: If no, request that the patient contact the pharmacy for the refill.) (Agent: If yes, when and what did the pharmacy advise?)  Preferred Pharmacy (with phone number or street name): [facility] [id number] - [place], [place].  [address] [facility] [place] [address]  Phone: [phone] Fax: [phone]       Agent: Please be advised that RX refills may take up to 3 business days. We ask that you follow-up with your pharmacy.

## 2019-05-06 NOTE — Telephone Encounter (Signed)
Requested medication (s) are due for refill today:yes  Requested medication (s) are on the active medication list yes  Last refill: 03/30/2019  #180 0 refills  Future visit scheduled no  Notes to clinic:Not delegated  Requested Prescriptions  Pending Prescriptions Disp Refills   oxyCODONE-acetaminophen (PERCOCET) 10-325 MG tablet 180 tablet 0    Sig: Take 2 tablets by mouth every 4 (four) hours as needed for pain.      Not Delegated - Analgesics:  Opioid Agonist Combinations Failed - 05/06/2019  4:02 PM      Failed - This refill cannot be delegated      Failed - Urine Drug Screen completed in last 360 days.      Passed - Valid encounter within last 6 months    Recent Outpatient Visits           2 months ago Essential hypertension   Archivist at [place], [place]   3 months ago Right ear pain   Archivist at [place], [place]   6 months ago Back pain, unspecified back location, unspecified back pain laterality, unspecified chronicity   Archivist at [place], [place]   8 months ago Back pain, unspecified back location, unspecified back pain laterality, unspecified chronicity   Archivist at [place], [place]   1 year ago Encounter for long-term (current) use of medications   Archivist at [place], Bonnita Levan, MD

## 2019-05-06 NOTE — Telephone Encounter (Signed)
Requesting:Percocet Contract:N/A (07/06/2017) [id number] Last OV:02/28/19 Next OV:N/A Last Refill:04/09/19 #180-0rf Database:   Please advise

## 2019-05-07 MED ORDER — OXYCODONE-ACETAMINOPHEN 10-325 MG PO TABS: 2.0000 | ORAL_TABLET | ORAL | 0 refills | Status: DC | PRN

## 2019-05-27 ENCOUNTER — Other Ambulatory Visit: Payer: Self-pay | Admitting: Family Medicine

## 2019-05-29 ENCOUNTER — Other Ambulatory Visit: Payer: Self-pay | Admitting: Family Medicine

## 2019-05-29 DIAGNOSIS — M549 Dorsalgia, unspecified: Secondary | ICD-10-CM

## 2019-05-29 MED ORDER — FENTANYL 75 MCG/HR TD PT72: 1.0000 | MEDICATED_PATCH | TRANSDERMAL | 0 refills | Status: DC

## 2019-05-29 NOTE — Telephone Encounter (Signed)
Requesting:fentanyl  Contract:yes [id number] one  Last OV:02/28/19 Next OV:n/a Last Refill:04/29/19  #10-0rf Database:   Please advise

## 2019-05-29 NOTE — Telephone Encounter (Signed)
Medication: fentaNYL ([place]) 75 MCG/HR [id number]   Has the patient contacted their pharmacy? Yes  (Agent: If no, request that the patient contact the pharmacy for the refill.) (Agent: If yes, when and what did the pharmacy advise?)  Preferred Pharmacy (with phone number or street name): [facility] [id number] [facility], [place] - [place] AT [place] [facility]  Phone:  [phone] Fax:  [phone]     Agent: Please be advised that RX refills may take up to 3 business days. We ask that you follow-up with your pharmacy.

## 2019-06-02 ENCOUNTER — Telehealth: Payer: Self-pay | Admitting: *Deleted

## 2019-06-02 NOTE — Telephone Encounter (Signed)
Guerry Bruin,  This is the PA I asked you about doing on Friday  Please advise

## 2019-06-02 NOTE — Telephone Encounter (Signed)
Prior auth submitted for fentanyl patches via cover my meds.  Awaiting outcome.  KeyDirk Dress PA# [id number]

## 2019-06-02 NOTE — Telephone Encounter (Signed)
Copied from [place] [phone]. Topic: General - Inquiry >> May 30, 2019  5:23 PM Alease Frame wrote: Reason for CRM: Patient is wanting a call back regarding medication that needs insurance approval from office. Please advise

## 2019-06-02 NOTE — Telephone Encounter (Signed)
Jennifer Kelly with the insurance benefits saying they have the prior auth..  Is the meds for prn or scheduled dosing.  CB#  [phone]

## 2019-06-02 NOTE — Telephone Encounter (Signed)
Patient called to to say that per her insurance claims to be missing a section on on the form that need to be completed and resubmitted. Asking that this is done for her please ASAP so that she can get her medication fentaNYL (DURAGESIC) 75 MCG/HR

## 2019-06-03 NOTE — Telephone Encounter (Signed)
Fentanyl patch approved from 06/03/19-11/30/19  Patient notified and pharmacy notified.

## 2019-06-03 NOTE — Telephone Encounter (Signed)
Called insurance and gave updated information and stated that it will be a 24 hour turn around time.  Advised patient about this as we know she is currently out of patches at this time.

## 2019-06-05 ENCOUNTER — Other Ambulatory Visit: Payer: Self-pay | Admitting: Family Medicine

## 2019-06-05 DIAGNOSIS — R52 Pain, unspecified: Secondary | ICD-10-CM

## 2019-06-05 MED ORDER — OXYCODONE-ACETAMINOPHEN 10-325 MG PO TABS: 2.0000 | ORAL_TABLET | ORAL | 0 refills | Status: DC | PRN

## 2019-06-05 NOTE — Telephone Encounter (Signed)
Requesting:percocet Contract:yes [id number] one  Last OV:02/28/19 Next OV:n/a Last Refill:05/07/19 #180-0rf Database:   Please advise

## 2019-06-05 NOTE — Telephone Encounter (Signed)
Requested medication (s) are due for refill today:  yes  Requested medication (s) are on the active medication list: yes  Last refill:  05/06/2019  Future visit scheduled: no  Notes to clinic:  This refill cannot be delegated    Requested Prescriptions  Pending Prescriptions Disp Refills   oxyCODONE-acetaminophen (PERCOCET) 10-325 MG tablet 180 tablet 0    Sig: Take 2 tablets by mouth every 4 (four) hours as needed for pain.      Not Delegated - Analgesics:  Opioid Agonist Combinations Failed - 06/05/2019  9:32 AM      Failed - This refill cannot be delegated      Failed - Urine Drug Screen completed in last 360 days.      Passed - Valid encounter within last 6 months    Recent Outpatient Visits           3 months ago Essential hypertension   Archivist at [place], [place]   4 months ago Right ear pain   Archivist at [place], [place]   7 months ago Back pain, unspecified back location, unspecified back pain laterality, unspecified chronicity   Archivist at [place], [place]   9 months ago Back pain, unspecified back location, unspecified back pain laterality, unspecified chronicity   Archivist at [place], [place]   1 year ago Encounter for long-term (current) use of medications   Archivist at [place], Bonnita Levan, [place]

## 2019-06-05 NOTE — Telephone Encounter (Signed)
Medication Refill - Medication: oxyCODONE-acetaminophen (PERCOCET) 10-325 MG tablet   Has the patient contacted their pharmacy? No. (Agent: If no, request that the patient contact the pharmacy for the refill.) (Agent: If yes, when and what did the pharmacy advise?)  Preferred Pharmacy (with phone number or street name): [facility] - [place], [place]. Phone:  [phone]  Fax:  [phone]       Agent: Please be advised that RX refills may take up to 3 business days. We ask that you follow-up with your pharmacy.

## 2019-06-11 ENCOUNTER — Other Ambulatory Visit: Payer: Self-pay | Admitting: Family Medicine

## 2019-07-01 ENCOUNTER — Telehealth: Payer: Self-pay | Admitting: Family Medicine

## 2019-07-01 ENCOUNTER — Other Ambulatory Visit: Payer: Self-pay | Admitting: Family Medicine

## 2019-07-01 DIAGNOSIS — M549 Dorsalgia, unspecified: Secondary | ICD-10-CM

## 2019-07-01 MED ORDER — FENTANYL 75 MCG/HR TD PT72: 1.0000 | MEDICATED_PATCH | TRANSDERMAL | 0 refills | Status: DC

## 2019-07-01 NOTE — Telephone Encounter (Signed)
Requesting:  Fentanyl Contract:   07/06/2017 UDS:   02/11/2018 Last Visit:    02/28/2019 Next Visit:  No upcoming appt. Last Refill:    05/29/2019  #10 no refills  Please Advise

## 2019-07-01 NOTE — Telephone Encounter (Signed)
  Medication: fentaNYL ([place]) 75 MCG/HR [id number]    Has the patient contacted their pharmacy? No.  Preferred Pharmacy (with phone number or street name):  [facility] [id number] [place], [place] - [place] AT [place] & [place] [address]  Phone:  [phone] Fax:  [phone]  DEA #:  [id number]

## 2019-07-02 ENCOUNTER — Other Ambulatory Visit: Payer: Self-pay | Admitting: Family Medicine

## 2019-07-02 ENCOUNTER — Telehealth: Payer: Self-pay | Admitting: Family Medicine

## 2019-07-02 DIAGNOSIS — R52 Pain, unspecified: Secondary | ICD-10-CM

## 2019-07-02 MED ORDER — OXYCODONE-ACETAMINOPHEN 10-325 MG PO TABS: 2.0000 | ORAL_TABLET | ORAL | 0 refills | Status: DC | PRN

## 2019-07-02 NOTE — Telephone Encounter (Signed)
Medication:  oxyCODONE-acetaminophen (PERCOCET) 10-325 MG tablet [id number]  Has the patient contacted their pharmacy? No.  Preferred Pharmacy:  [facility] # [address], [place] [facility] [place] [address]  Phone:  [phone] Fax:  [phone]    Agent: Please be advised that RX refills may take up to 3 business days. We ask that you follow-up with your pharmacy.

## 2019-07-25 ENCOUNTER — Other Ambulatory Visit: Payer: Self-pay

## 2019-07-25 DIAGNOSIS — M549 Dorsalgia, unspecified: Secondary | ICD-10-CM

## 2019-07-25 NOTE — Telephone Encounter (Signed)
Patient called in to see if Dr. Charlett Blake can send in a prescription for fentaNYL ([place]) 75 MCG/HR [id number]   Please send it to [facility] [id number] [facility], [place] [address] [place] [address] [place] [address]  Phone:  [phone] Fax:  [phone]  DEA #:  [id number]  Thanks,

## 2019-07-27 ENCOUNTER — Other Ambulatory Visit: Payer: Self-pay | Admitting: Family Medicine

## 2019-07-28 MED ORDER — FENTANYL 75 MCG/HR TD PT72: 1.0000 | MEDICATED_PATCH | TRANSDERMAL | 0 refills | Status: DC

## 2019-07-28 NOTE — Telephone Encounter (Signed)
Requesting: fentanyl  Contract:yes UDS:low risk next screen 08/12/19 Last OV:02/28/19 Next OV:n/a Last Refill:07/01/19  #[id number] Database:   Please advise

## 2019-07-30 ENCOUNTER — Other Ambulatory Visit: Payer: Self-pay | Admitting: Family Medicine

## 2019-07-30 ENCOUNTER — Telehealth: Payer: Self-pay | Admitting: Family Medicine

## 2019-07-30 DIAGNOSIS — R52 Pain, unspecified: Secondary | ICD-10-CM

## 2019-07-30 MED ORDER — OXYCODONE-ACETAMINOPHEN 10-325 MG PO TABS: 2.0000 | ORAL_TABLET | ORAL | 0 refills | Status: DC | PRN

## 2019-07-30 NOTE — Telephone Encounter (Signed)
Medication:oxyCODONE-acetaminophen (PERCOCET) 10-325 MG tablet   Has the patient contacted their pharmacy? No. (If no, request that the patient contact the pharmacy for the refill.) (If yes, when and what did the pharmacy advise?)  Preferred Pharmacy (with phone number or street name): [id number] - [place], [place] - [address]  [address] [place]  [address]  Phone:  [phone] Fax:  [phone]  DEA #:  --  Agent: Please be advised that RX refills may take up to 3 business days. We ask that you follow-up with your pharmacy.  

## 2019-07-30 NOTE — Telephone Encounter (Signed)
Last written: 07/02/19 Last ov: 02/28/19 Next ov: none Contract: 07/06/17 due UDS: 02/11/18 due

## 2019-08-15 ENCOUNTER — Telehealth: Payer: Self-pay | Admitting: Family Medicine

## 2019-08-15 DIAGNOSIS — I10 Essential (primary) hypertension: Secondary | ICD-10-CM

## 2019-08-15 MED ORDER — LOSARTAN POTASSIUM 50 MG PO TABS: 50.0000 mg | ORAL_TABLET | Freq: Every day | ORAL | 1 refills | Status: DC

## 2019-08-15 MED ORDER — VENLAFAXINE HCL ER 75 MG PO CP24: ORAL_CAPSULE | ORAL | 1 refills | Status: DC

## 2019-08-15 MED ORDER — DILTIAZEM HCL ER BEADS 240 MG PO CP24: 240.0000 mg | ORAL_CAPSULE | Freq: Every day | ORAL | 1 refills | Status: DC

## 2019-08-15 MED ORDER — HYDROCHLOROTHIAZIDE 12.5 MG PO CAPS: 12.5000 mg | ORAL_CAPSULE | Freq: Every day | ORAL | 1 refills | Status: DC

## 2019-08-15 MED ORDER — CITALOPRAM HYDROBROMIDE 20 MG PO TABS: 20.0000 mg | ORAL_TABLET | Freq: Every day | ORAL | 1 refills | Status: DC

## 2019-08-15 NOTE — Telephone Encounter (Signed)
Caller : Avett Buonocore Call Back # [phone]   Patient states that she was laid off today and her insurance terms today @ 11:59pm  Patient is looking fill all medications that can be filled before tonight at 11:59pm.  Please advise

## 2019-08-15 NOTE — Telephone Encounter (Signed)
Spoke with patient and she stated that she needed her maintenance medications.  Medications that can be refilled has been sent in.

## 2019-08-26 ENCOUNTER — Telehealth: Payer: Self-pay | Admitting: Family Medicine

## 2019-08-26 ENCOUNTER — Other Ambulatory Visit: Payer: Self-pay | Admitting: Family Medicine

## 2019-08-26 DIAGNOSIS — R52 Pain, unspecified: Secondary | ICD-10-CM

## 2019-08-26 MED ORDER — OXYCODONE-ACETAMINOPHEN 10-325 MG PO TABS: 2.0000 | ORAL_TABLET | ORAL | 0 refills | Status: DC | PRN

## 2019-08-26 NOTE — Telephone Encounter (Signed)
Patient is unemployed at the moment and does not have any insurance at this time.  She will call to make and appointment as soon as she gets insurance.  Last written: 07/30/19 Last ov:  02/28/19 Next ov: none Contract: due UDS: due

## 2019-08-26 NOTE — Telephone Encounter (Signed)
I have sent in Percocet to [facility]

## 2019-08-26 NOTE — Telephone Encounter (Signed)
Medication: oxyCODONE-acetaminophen (PERCOCET) 10-325 MG  Has the patient contacted their pharmacy? No. (If no, request that the patient contact the pharmacy for the refill.) (If yes, when and what did the pharmacy advise?)  Preferred Pharmacy (with phone number or street name):   [facility] # [address], [place].  [place] [place] [address]  Phone:  [phone] Fax:  [phone]   Agent: Please be advised that RX refills may take up to 3 business days. We ask that you follow-up with your pharmacy.

## 2019-08-27 NOTE — Telephone Encounter (Signed)
Left message on machine that medication has been sent in. °

## 2019-08-29 ENCOUNTER — Other Ambulatory Visit: Payer: Self-pay | Admitting: Family Medicine

## 2019-08-29 ENCOUNTER — Telehealth: Payer: Self-pay | Admitting: Family Medicine

## 2019-08-29 DIAGNOSIS — M549 Dorsalgia, unspecified: Secondary | ICD-10-CM

## 2019-08-29 MED ORDER — FENTANYL 75 MCG/HR TD PT72: 1.0000 | MEDICATED_PATCH | TRANSDERMAL | 0 refills | Status: DC

## 2019-08-29 NOTE — Telephone Encounter (Signed)
I have sent in to pharmacy as requested

## 2019-08-29 NOTE — Telephone Encounter (Signed)
Medication: fentaNYL ([place]) 75 MCG/HR    Has the patient contacted their pharmacy? No. (If no, request that the patient contact the pharmacy for the refill.) (If yes, when and what did the pharmacy advise?)  Preferred Pharmacy (with phone number or street name): [facility] # [address] [place].  [place] [place] [address]  Phone:  [phone] Fax:  [phone]   Agent: Please be advised that RX refills may take up to 3 business days. We ask that you follow-up with your pharmacy.   Patient laid off and needs this to go [facility] Due to cost.

## 2019-08-29 NOTE — Telephone Encounter (Signed)
Patient notified

## 2019-08-29 NOTE — Telephone Encounter (Signed)
Last written: 07/28/19 Last ov:  02/28/19 Next ov: none Contract: due UDS: due  Patient is unemployed at the moment and does not have any insurance at this time.  She will call to make and appointment as soon as she gets insurance.

## 2019-09-22 ENCOUNTER — Telehealth: Payer: Self-pay

## 2019-09-22 ENCOUNTER — Other Ambulatory Visit: Payer: Self-pay | Admitting: Family Medicine

## 2019-09-22 DIAGNOSIS — R52 Pain, unspecified: Secondary | ICD-10-CM

## 2019-09-22 MED ORDER — OXYCODONE-ACETAMINOPHEN 10-325 MG PO TABS: 2.0000 | ORAL_TABLET | ORAL | 0 refills | Status: DC | PRN

## 2019-09-22 NOTE — Telephone Encounter (Signed)
Patient called in to see if DR. Charlett Blake could send in a prescription for  oxyCODONE-acetaminophen (PERCOCET) 10-325 MG tablet [id number]    Please send it to [facility] # [address] [place] [place] [address]  Phone:  [phone] Fax:  [phone]  DEA #:  --

## 2019-09-22 NOTE — Telephone Encounter (Signed)
Last written: 08/26/19 Last ov: 02/28/19 Next ov: none Contract: due UDS: due  Patient is unemployed at the moment and does not have any insurance at this time. She will call to make and appointment as soon as she gets insurance.

## 2019-09-22 NOTE — Telephone Encounter (Signed)
I have sent in a refill but that makes 7 months she will need a virtual visit next month before further refills. I will then not need to see her until 6 more months have past.

## 2019-09-23 ENCOUNTER — Other Ambulatory Visit: Payer: Self-pay | Admitting: Family Medicine

## 2019-09-23 DIAGNOSIS — M549 Dorsalgia, unspecified: Secondary | ICD-10-CM

## 2019-09-23 MED ORDER — FENTANYL 75 MCG/HR TD PT72: 1.0000 | MEDICATED_PATCH | TRANSDERMAL | 0 refills | Status: DC

## 2019-09-23 NOTE — Telephone Encounter (Signed)
Sent in

## 2019-09-23 NOTE — Telephone Encounter (Signed)
Patient scheduled for 10/13/19.    She also needs her patches refilled and sent to [facility] in [place].  She is going out of town on Friday.  Her last fill was 08/29/19.  Last written: 08/29/19 Last ov: 02/28/19 Next ov: 10/13/19 Contract: due UDS: due

## 2019-09-24 ENCOUNTER — Telehealth: Payer: Self-pay

## 2019-09-24 NOTE — Telephone Encounter (Signed)
PA initiated via Covermymeds; KEY: [id number]. PA approved.   PA Case: [id number], Status: Approved, Coverage Starts on: 09/24/2019 12:00:00 AM, Coverage Ends on: 03/22/2020 12:00:00 AM.

## 2019-09-24 NOTE — Telephone Encounter (Signed)
Left message on machine that patches was sent in.

## 2019-10-13 ENCOUNTER — Telehealth (INDEPENDENT_AMBULATORY_CARE_PROVIDER_SITE_OTHER): Payer: BLUE CROSS/BLUE SHIELD | Admitting: Family Medicine

## 2019-10-13 ENCOUNTER — Other Ambulatory Visit: Payer: Self-pay

## 2019-10-13 DIAGNOSIS — R739 Hyperglycemia, unspecified: Secondary | ICD-10-CM | POA: Diagnosis not present

## 2019-10-13 DIAGNOSIS — R52 Pain, unspecified: Secondary | ICD-10-CM

## 2019-10-13 DIAGNOSIS — I10 Essential (primary) hypertension: Secondary | ICD-10-CM | POA: Diagnosis not present

## 2019-10-13 DIAGNOSIS — M545 Low back pain, unspecified: Secondary | ICD-10-CM

## 2019-10-13 DIAGNOSIS — M549 Dorsalgia, unspecified: Secondary | ICD-10-CM | POA: Diagnosis not present

## 2019-10-13 DIAGNOSIS — L309 Dermatitis, unspecified: Secondary | ICD-10-CM

## 2019-10-13 MED ORDER — VENLAFAXINE HCL ER 75 MG PO CP24: ORAL_CAPSULE | ORAL | 1 refills | Status: DC

## 2019-10-13 MED ORDER — FENTANYL 75 MCG/HR TD PT72: 1.0000 | MEDICATED_PATCH | TRANSDERMAL | 0 refills | Status: DC

## 2019-10-13 MED ORDER — HYDROCHLOROTHIAZIDE 12.5 MG PO CAPS: 12.5000 mg | ORAL_CAPSULE | Freq: Every day | ORAL | 1 refills | Status: DC

## 2019-10-13 MED ORDER — OXYCODONE-ACETAMINOPHEN 10-325 MG PO TABS: 2.0000 | ORAL_TABLET | ORAL | 0 refills | Status: DC | PRN

## 2019-10-13 MED ORDER — GABAPENTIN 600 MG PO TABS: ORAL_TABLET | ORAL | 1 refills | Status: DC

## 2019-10-13 MED ORDER — FLUCONAZOLE 150 MG PO TABS
150.0000 mg | ORAL_TABLET | ORAL | 1 refills | Status: DC
Start: 2019-10-13 — End: 2020-05-31

## 2019-10-13 MED ORDER — NYSTATIN 100000 UNIT/GM EX CREA: 1.0000 "application " | TOPICAL_CREAM | Freq: Two times a day (BID) | CUTANEOUS | 0 refills | Status: DC | PRN

## 2019-10-13 MED ORDER — CITALOPRAM HYDROBROMIDE 20 MG PO TABS: 20.0000 mg | ORAL_TABLET | Freq: Every day | ORAL | 1 refills | Status: DC

## 2019-10-13 MED ORDER — LOSARTAN POTASSIUM 50 MG PO TABS: 50.0000 mg | ORAL_TABLET | Freq: Every day | ORAL | 1 refills | Status: DC

## 2019-10-13 MED ORDER — DILTIAZEM HCL ER BEADS 240 MG PO CP24: 240.0000 mg | ORAL_CAPSULE | Freq: Every day | ORAL | 1 refills | Status: DC

## 2019-10-13 NOTE — Assessment & Plan Note (Signed)
Well controlled, no changes to meds. Encouraged heart healthy diet such as the DASH diet and exercise as tolerated.  °

## 2019-10-15 LAB — HEMOGLOBIN A1C
Hemoglobin A1C: 5.4
Hemoglobin A1C: 5.4

## 2019-10-15 LAB — COMPREHENSIVE METABOLIC PANEL
Albumin: 4 (ref 3.5–5.0)
Calcium: 9.1 (ref 8.7–10.7)
Globulin: 3

## 2019-10-15 LAB — CBC: RBC: 4.82 (ref 3.87–5.11)

## 2019-10-15 LAB — HEPATIC FUNCTION PANEL
ALT: 34 (ref 7–35)
AST: 23 (ref 13–35)
Alkaline Phosphatase: 86 (ref 25–125)
Bilirubin, Total: 0.6

## 2019-10-15 LAB — CBC AND DIFFERENTIAL
HCT: 45 (ref 36–46)
Hemoglobin: 15.1 (ref 12.0–16.0)
Neutrophils Absolute: 6223
Platelets: 208 (ref 150–399)
WBC: 9.5

## 2019-10-15 LAB — BASIC METABOLIC PANEL
CO2: 28 — AB (ref 13–22)
Chloride: 101 (ref 99–108)
Potassium: 3.8 (ref 3.4–5.3)
Sodium: 138 (ref 137–147)

## 2019-10-15 LAB — TSH: TSH: 2.06 (ref 0.41–5.90)

## 2019-10-15 LAB — POCT ERYTHROCYTE SEDIMENTATION RATE, NON-AUTOMATED: Sed Rate: 11

## 2019-10-15 NOTE — Assessment & Plan Note (Signed)
Under breasts, has suffered fungal infections in past, started on Nystatin cream and Diflucan and she will report if no response.

## 2019-10-15 NOTE — Assessment & Plan Note (Signed)
Encouraged moist heat and gentle stretching as tolerated. May try NSAIDs and prescription meds as directed and report if symptoms worsen or seek immediate care. Stable on current meds no changes at this time.

## 2019-10-15 NOTE — Assessment & Plan Note (Signed)
hgba1c acceptable, minimize simple carbs. Increase exercise as tolerated.  

## 2019-10-15 NOTE — Progress Notes (Signed)
Virtual Visit via phone Note  I connected with Jennifer Kelly on 10/13/19 at  3:00 PM EDT by a phone enabled telemedicine application and verified that I am speaking with the correct person using two identifiers.  Location: Patient: home, patient was in visit Provider: office, provider was in visit   I discussed the limitations of evaluation and management by telemedicine and the availability of in person appointments. The patient expressed understanding and agreed to proceed. Robin Ewing, CMA was able to get the patient set up on a video visit   Subjective:    Patient ID: Jennifer Kelly, female    DOB: 31-May-1960, 59 y.o.   MRN: [id number]  Chief Complaint  Patient presents with  . Pain    medication followup.     HPI Patient is in today for follow up on chronic medical concerns. She is noting apruritic rash under her breasts. No recent febrile illness or hospitalizations. She continues to struggle with daily pain but it it is manageable with current therapy. Her stress has been high but her medications have helped this as well. No c/o polyuria or polydipsia. She has been maintaining quarantine.Denies CP/palp/SOB/HA/congestion/fevers/GI or GU c/o. Taking meds as prescribed  Past Medical History:  Diagnosis Date  . Acute bronchitis 01/05/2014  . Arthritis   . Chicken pox as a child  . Dermatitis 09/09/2014  . Ganglion cyst of left foot 09/09/2014  . H/O mumps   . H/O tobacco use, presenting hazards to health 10/08/2009   Qualifier: Diagnosis of  By: Linda Hedges MD, Heinz Knuckles  Last cigarette 02/16/2016   . History of chicken pox   . Hyperglycemia 11/30/2015  . Hypertension   . Morbid obesity ([place]) 08/31/2009   Qualifier: Diagnosis of  By: Linda Hedges MD, Heinz Knuckles  BMI 34.4(Sept '13) BMI 34 (March '15)   . Mumps as a child  . Osteopenia 01/02/2017  . Osteoporosis 11/20/2016  . Psoriatic arthritis ([place])   . Rheumatoid arthritis ([place])   . Shingles 2014    Past Surgical History:  Procedure  Laterality Date  . BACK SURGERY  2003  . NECK SURGERY  02/17/2016   C4, C5 & C6  . rotater cuff  20 yrs ago   left shoulder  . TOOTH EXTRACTION  05/2012  . WISDOM TOOTH EXTRACTION  59 yrs old    Family History  Problem Relation Age of Onset  . Parkinson's disease Mother   . Heart disease Mother   . Hypertension Mother   . Diabetes Father        type 2  . Hypertension Father   . Stroke Father   . Vascular Disease Brother   . Hypertension Brother   . Diabetes Brother   . Heart attack Paternal Uncle     Social History   Socioeconomic History  . Marital status: Divorced    Spouse name: Not on file  . Number of children: 0  . Years of education: Not on file  . Highest education level: Not on file  Occupational History  . Occupation: help Therapist, sports  Tobacco Use  . Smoking status: Former Smoker    Start date: 11/30/2015  . Smokeless tobacco: Never Used  Substance and Sexual Activity  . Alcohol use: Yes    Comment: occ wine  . Drug use: No  . Sexual activity: Never    Comment: no dietary restrictions. lives alone  Other Topics Concern  . Not on file  Social History Narrative  . Not  on file   Social Determinants of Health   Financial Resource Strain:   . Difficulty of Paying Living Expenses:   Food Insecurity:   . Worried About Charity fundraiser in the Last Year:   . Arboriculturist in the Last Year:   Transportation Needs:   . Film/video editor (Medical):   Marland Kitchen Lack of Transportation (Non-Medical):   Physical Activity:   . Days of Exercise per Week:   . Minutes of Exercise per Session:   Stress:   . Feeling of Stress :   Social Connections:   . Frequency of Communication with Friends and Family:   . Frequency of Social Gatherings with Friends and Family:   . Attends Religious Services:   . Active Member of Clubs or Organizations:   . Attends Archivist Meetings:   Marland Kitchen Marital Status:   Intimate Partner Violence:   . Fear of Current or  Ex-Partner:   . Emotionally Abused:   Marland Kitchen Physically Abused:   . Sexually Abused:     Outpatient Medications Prior to Visit  Medication Sig Dispense Refill  . albuterol (VENTOLIN HFA) 108 (90 Base) MCG/ACT inhaler INHALE 2 PUFFS BY MOUTH EVERY 6 HOURS AS NEEDED FOR WHEEZING 18 g 5  . citalopram (CELEXA) 20 MG tablet Take 1 tablet (20 mg total) by mouth daily. 90 tablet 1  . diltiazem (TIAZAC) 240 MG 24 hr capsule Take 1 capsule (240 mg total) by mouth daily. 90 capsule 1  . fentaNYL (D[place]) 75 MCG/HR Place 1 patch onto the skin every 3 (three) days. April 2021 10 patch 0  . gabapentin (NEURONTIN) 600 MG tablet TAKE 1 TABLET(600 MG) BY MOUTH THREE TIMES DAILY 90 tablet 1  . hydrochlorothiazide (MICROZIDE) 12.5 MG capsule Take 1 capsule (12.5 mg total) by mouth daily. 90 capsule 1  . losartan (COZAAR) 50 MG tablet Take 1 tablet (50 mg total) by mouth daily. 90 tablet 1  . oxyCODONE-acetaminophen (PERCOCET) 10-325 MG tablet Take 2 tablets by mouth every 4 (four) hours as needed for pain. 180 tablet 0  . venlafaxine XR (EFFEXOR-XR) 75 MG 24 hr capsule Take 1 capsule po daily 90 capsule 1  . Baricitinib (OLUMIANT) 2 MG TABS Take 2 mg by mouth daily. (Patient not taking: Reported on 10/13/2019) 30 tablet 0  . predniSONE (DELTASONE) 10 MG tablet Take 1 tablet (10 mg total) by mouth daily as needed. (Patient not taking: Reported on 10/13/2019) 30 tablet 0  . predniSONE (DELTASONE) 5 MG tablet Take 20 mg by mouth daily.   0   No facility-administered medications prior to visit.    Allergies  Allergen Reactions  . Simponi [Golimumab] Rash    States that it was systemic    Review of Systems  Constitutional: Negative for fever and malaise/fatigue.  HENT: Negative for congestion.   Eyes: Negative for blurred vision.  Respiratory: Negative for shortness of breath.   Cardiovascular: Negative for chest pain, palpitations and leg swelling.  Gastrointestinal: Negative for abdominal pain, blood in  stool and nausea.  Genitourinary: Negative for dysuria and frequency.  Musculoskeletal: Positive for back pain, joint pain and myalgias. Negative for falls.  Skin: Positive for itching and rash.  Neurological: Negative for dizziness, loss of consciousness and headaches.  Endo/Heme/Allergies: Negative for environmental allergies.  Psychiatric/Behavioral: Positive for depression. The patient is not nervous/anxious.        Objective:    Physical Exam unable to obtain via phone visit  There were  no vitals taken for this visit. Wt Readings from Last 3 Encounters:  02/28/19 239 lb (108.4 kg)  02/11/18 238 lb (108 kg)  08/09/17 245 lb 12.8 oz (111.5 kg)    Diabetic Foot Exam - Simple   No data filed     Lab Results  Component Value Date   WBC 10.5 02/11/2018   HGB 14.4 02/11/2018   HCT 42.5 02/11/2018   PLT 243.0 02/11/2018   GLUCOSE 85 02/11/2018   CHOL 161 02/11/2018   TRIG 105.0 02/11/2018   HDL 68.90 02/11/2018   LDLCALC 71 02/11/2018   ALT 20 02/11/2018   AST 16 02/11/2018   NA 138 02/11/2018   K 3.8 02/11/2018   CL 98 02/11/2018   CREATININE 0.63 02/11/2018   BUN 20 02/11/2018   CO2 32 02/11/2018   TSH 1.07 02/11/2018   HGBA1C 6.0 02/11/2018    Lab Results  Component Value Date   TSH 1.07 02/11/2018   Lab Results  Component Value Date   WBC 10.5 02/11/2018   HGB 14.4 02/11/2018   HCT 42.5 02/11/2018   MCV 90.6 02/11/2018   PLT 243.0 02/11/2018   Lab Results  Component Value Date   NA 138 02/11/2018   K 3.8 02/11/2018   CO2 32 02/11/2018   GLUCOSE 85 02/11/2018   BUN 20 02/11/2018   CREATININE 0.63 02/11/2018   BILITOT 0.6 02/11/2018   ALKPHOS 84 02/11/2018   AST 16 02/11/2018   ALT 20 02/11/2018   PROT 7.6 02/11/2018   ALBUMIN 4.2 02/11/2018   CALCIUM 9.4 02/11/2018   ANIONGAP 8 11/26/2014   GFR 103.01 02/11/2018   Lab Results  Component Value Date   CHOL 161 02/11/2018   Lab Results  Component Value Date   HDL 68.90 02/11/2018    Lab Results  Component Value Date   LDLCALC 71 02/11/2018   Lab Results  Component Value Date   TRIG 105.0 02/11/2018   Lab Results  Component Value Date   CHOLHDL 2 02/11/2018   Lab Results  Component Value Date   HGBA1C 6.0 02/11/2018       Assessment & Plan:   Problem List Items Addressed This Visit    LOW BACK PAIN    Encouraged moist heat and gentle stretching as tolerated. May try NSAIDs and prescription meds as directed and report if symptoms worsen or seek immediate care. Stable on current meds no changes at this time.      Relevant Medications   oxyCODONE-acetaminophen (PERCOCET) 10-325 MG tablet   fentaNYL ([place]) 75 MCG/HR   Hypertension    Well controlled, no changes to meds. Encouraged heart healthy diet such as the DASH diet and exercise as tolerated.       Relevant Medications   hydrochlorothiazide (MICROZIDE) 12.5 MG capsule   diltiazem (TIAZAC) 240 MG 24 hr capsule   losartan (COZAAR) 50 MG tablet   Pain   Relevant Medications   oxyCODONE-acetaminophen (PERCOCET) 10-325 MG tablet   Dermatitis    Under breasts, has suffered fungal infections in past, started on Nystatin cream and Diflucan and she will report if no response.       Hyperglycemia    hgba1c acceptable, minimize simple carbs. Increase exercise as tolerated.       Other Visit Diagnoses    Back pain, unspecified back location, unspecified back pain laterality, unspecified chronicity       Relevant Medications   oxyCODONE-acetaminophen (PERCOCET) 10-325 MG tablet   fentaNYL (DUR[place]) 75 MCG/HR  I have discontinued Jennifer Kelly's predniSONE and predniSONE. I have also changed her oxyCODONE-acetaminophen and fentaNYL. Additionally, I am having her start on fluconazole and nystatin cream. Lastly, I am having her maintain her baricitinib, albuterol, hydrochlorothiazide, venlafaxine XR, diltiazem, losartan, citalopram, and gabapentin.  Meds ordered this encounter   Medications  . hydrochlorothiazide (MICROZIDE) 12.5 MG capsule    Sig: Take 1 capsule (12.5 mg total) by mouth daily.    Dispense:  90 capsule    Refill:  1  . venlafaxine XR (EFFEXOR-XR) 75 MG 24 hr capsule    Sig: Take 1 capsule po daily    Dispense:  90 capsule    Refill:  1  . diltiazem (TIAZAC) 240 MG 24 hr capsule    Sig: Take 1 capsule (240 mg total) by mouth daily.    Dispense:  90 capsule    Refill:  1  . losartan (COZAAR) 50 MG tablet    Sig: Take 1 tablet (50 mg total) by mouth daily.    Dispense:  90 tablet    Refill:  1  . citalopram (CELEXA) 20 MG tablet    Sig: Take 1 tablet (20 mg total) by mouth daily.    Dispense:  90 tablet    Refill:  1  . gabapentin (NEURONTIN) 600 MG tablet    Sig: TAKE 1 TABLET(600 MG) BY MOUTH THREE TIMES DAILY    Dispense:  270 tablet    Refill:  1  . oxyCODONE-acetaminophen (PERCOCET) 10-325 MG tablet    Sig: Take 2 tablets by mouth every 4 (four) hours as needed for pain. June 2021    Dispense:  180 tablet    Refill:  0  . fentaNYL (DURAGES[place]) 75 MCG/HR    Sig: Place 1 patch onto the skin every 3 (three) days. June 2021    Dispense:  10 patch    Refill:  0  . fluconazole (DIFLUCAN) 150 MG tablet    Sig: Take 1 tablet (150 mg total) by mouth once a week.    Dispense:  4 tablet    Refill:  1  . nystatin cream (MYCOSTATIN)    Sig: Apply 1 application topically 2 (two) times daily as needed for dry skin.    Dispense:  30 g    Refill:  0  I discussed the assessment and treatment plan with the patient. The patient was provided an opportunity to ask questions and all were answered. The patient agreed with the plan and demonstrated an understanding of the instructions.   The patient was advised to call back or seek an in-person evaluation if the symptoms worsen or if the condition fails to improve as anticipated.  I provided 20 minutes of non-face-to-face time during this encounter.   Penni Homans, MD

## 2019-10-29 ENCOUNTER — Encounter: Payer: Self-pay | Admitting: *Deleted

## 2019-11-03 ENCOUNTER — Other Ambulatory Visit: Payer: Self-pay | Admitting: Family Medicine

## 2019-11-13 ENCOUNTER — Encounter: Payer: Self-pay | Admitting: Family Medicine

## 2019-11-13 ENCOUNTER — Telehealth: Payer: Self-pay

## 2019-11-13 NOTE — Telephone Encounter (Signed)
Patient called in to speak with the nurse about the following medications.    fentaNYL ([place]) 75 MCG/HR [[id number]   oxyCODONE-acetaminophen (PERCOCET) 10-325 MG tablet [[id number]]    Please give the patient a call to advise at [phone] as soon as possible.

## 2019-11-14 ENCOUNTER — Other Ambulatory Visit: Payer: Self-pay | Admitting: Family Medicine

## 2019-11-14 ENCOUNTER — Telehealth: Payer: Self-pay | Admitting: Family Medicine

## 2019-11-14 DIAGNOSIS — R52 Pain, unspecified: Secondary | ICD-10-CM

## 2019-11-14 MED ORDER — OXYCODONE-ACETAMINOPHEN 10-325 MG PO TABS: 2.0000 | ORAL_TABLET | ORAL | 0 refills | Status: DC | PRN

## 2019-11-14 NOTE — Telephone Encounter (Signed)
Requesting: Percocet Contract: n/a UDS:02/11/18 Last Visit:01/21/2019 Next Visit:12/30/19 Last Refill:10/13/19  Please Advise

## 2019-11-14 NOTE — Telephone Encounter (Signed)
Medication:oxyCODONE-acetaminophen (PERCOCET) 10-325 MG tablet   Has the patient contacted their pharmacy? No. (If no, request that the patient contact the pharmacy for the refill.) (If yes, when and what did the pharmacy advise?)  Preferred Pharmacy (with phone number or street name): [facility] [address] [place].  [place] [place] [address]  Phone:  [phone] Fax:  [phone]  DEA #:  --  Agent: Please be advised that RX refills may take up to 3 business days. We ask that you follow-up with your pharmacy.

## 2019-11-14 NOTE — Telephone Encounter (Signed)
done

## 2019-11-17 ENCOUNTER — Other Ambulatory Visit: Payer: Self-pay | Admitting: Family Medicine

## 2019-11-17 DIAGNOSIS — M549 Dorsalgia, unspecified: Secondary | ICD-10-CM

## 2019-11-17 NOTE — Telephone Encounter (Signed)
See my chart message

## 2019-11-18 ENCOUNTER — Other Ambulatory Visit: Payer: Self-pay | Admitting: Family Medicine

## 2019-11-18 DIAGNOSIS — M549 Dorsalgia, unspecified: Secondary | ICD-10-CM

## 2019-11-18 MED ORDER — FENTANYL 75 MCG/HR TD PT72: 1.0000 | MEDICATED_PATCH | TRANSDERMAL | 0 refills | Status: DC

## 2019-11-26 ENCOUNTER — Other Ambulatory Visit: Payer: Self-pay | Admitting: Family Medicine

## 2019-11-26 ENCOUNTER — Telehealth: Payer: Self-pay | Admitting: Family Medicine

## 2019-11-26 DIAGNOSIS — M549 Dorsalgia, unspecified: Secondary | ICD-10-CM

## 2019-11-26 MED ORDER — FENTANYL 75 MCG/HR TD PT72: 1.0000 | MEDICATED_PATCH | TRANSDERMAL | 0 refills | Status: DC

## 2019-11-26 NOTE — Telephone Encounter (Signed)
Patient states she would like fentaNYL ([place]) 75 MCG/HR   NEW PHARMACY  To be send to [facility], [place], [place] [address] Phone: [phone]

## 2019-11-26 NOTE — Telephone Encounter (Signed)
Sent by PCP

## 2019-12-15 ENCOUNTER — Other Ambulatory Visit: Payer: Self-pay | Admitting: Family Medicine

## 2019-12-15 ENCOUNTER — Encounter: Payer: Self-pay | Admitting: Family Medicine

## 2019-12-15 DIAGNOSIS — R52 Pain, unspecified: Secondary | ICD-10-CM

## 2019-12-15 MED ORDER — OXYCODONE-ACETAMINOPHEN 10-325 MG PO TABS: 2.0000 | ORAL_TABLET | ORAL | 0 refills | Status: DC | PRN

## 2019-12-15 NOTE — Telephone Encounter (Signed)
done

## 2019-12-15 NOTE — Telephone Encounter (Signed)
Last written: 11/14/19 Last ov: 10/13/19 Next ov: 12/30/19 Contract:  UDS: 10/15/19

## 2019-12-15 NOTE — Telephone Encounter (Signed)
Medication: oxyCODONE-acetaminophen (PERCOCET) 10-325 MG tablet    Has the patient contacted their pharmacy? No. (If no, request that the patient contact the pharmacy for the refill.) (If yes, when and what did the pharmacy advise?)  Preferred Pharmacy (with phone number or street name): [place], [place] [address]  [phone]  Agent: Please be advised that RX refills may take up to 3 business days. We ask that you follow-up with your pharmacy.

## 2019-12-24 ENCOUNTER — Other Ambulatory Visit: Payer: Self-pay

## 2019-12-24 ENCOUNTER — Telehealth: Payer: Self-pay | Admitting: *Deleted

## 2019-12-24 ENCOUNTER — Telehealth (INDEPENDENT_AMBULATORY_CARE_PROVIDER_SITE_OTHER): Payer: BLUE CROSS/BLUE SHIELD | Admitting: Family Medicine

## 2019-12-24 DIAGNOSIS — R52 Pain, unspecified: Secondary | ICD-10-CM | POA: Diagnosis not present

## 2019-12-24 DIAGNOSIS — I10 Essential (primary) hypertension: Secondary | ICD-10-CM

## 2019-12-24 DIAGNOSIS — R739 Hyperglycemia, unspecified: Secondary | ICD-10-CM

## 2019-12-24 DIAGNOSIS — M549 Dorsalgia, unspecified: Secondary | ICD-10-CM

## 2019-12-24 DIAGNOSIS — F418 Other specified anxiety disorders: Secondary | ICD-10-CM

## 2019-12-24 DIAGNOSIS — M05741 Rheumatoid arthritis with rheumatoid factor of right hand without organ or systems involvement: Secondary | ICD-10-CM

## 2019-12-24 DIAGNOSIS — L309 Dermatitis, unspecified: Secondary | ICD-10-CM

## 2019-12-24 DIAGNOSIS — M05742 Rheumatoid arthritis with rheumatoid factor of left hand without organ or systems involvement: Secondary | ICD-10-CM

## 2019-12-24 MED ORDER — FENTANYL 75 MCG/HR TD PT72: 1.0000 | MEDICATED_PATCH | TRANSDERMAL | 0 refills | Status: DC

## 2019-12-24 NOTE — Assessment & Plan Note (Signed)
Monitor and no changes to meds. Encouraged heart healthy diet such as the DASH diet and exercise as tolerated.

## 2019-12-24 NOTE — Assessment & Plan Note (Signed)
hgba1c acceptable, minimize simple carbs. Increase exercise as tolerated.  

## 2019-12-24 NOTE — Assessment & Plan Note (Signed)
Trying to stay active and managing with pain meds most days. Refill given for Fentanyl today

## 2019-12-24 NOTE — Telephone Encounter (Signed)
Left message on machine to call back to schedule appointment  Need to be scheduled for follow up at end of Sept.  Advised on machine that we do not have any openings and first available would be 10/15

## 2019-12-24 NOTE — Progress Notes (Signed)
Virtual Visit via Video Note  I connected with Jennifer Kelly on 12/24/19 at  9:40 AM EDT by a video enabled telemedicine application and verified that I am speaking with the correct person using two identifiers.  Location: Patient: Home, patient and provider are in visit Provider: home   I discussed the limitations of evaluation and management by telemedicine and the availability of in person appointments. The patient expressed understanding and agreed to proceed. Kem Boroughs, CMA was able to get the patient set up on the video visit    Subjective:    Patient ID: Jennifer Kelly, female    DOB: 1960-10-01, 59 y.o.   MRN: [id number]  Chief Complaint  Patient presents with  . Follow-up    HPI Patient is in today for follow up on chronic medical concerns. No recent febrile illness or hospitalizations. She is still struggling to find work so she is staying at [employer] with family while she looks. She notes increased anxiety as a result but the venlafaxine has been helpful. No acute illness but she continues to struggle with daily pain especially in back. Her rash has improved with swimming in the ocean. Denies CP/palp/SOB/HA/congestion/fevers/GI or GU c/o. Taking meds as prescribed  Past Medical History:  Diagnosis Date  . Acute bronchitis 01/05/2014  . Arthritis   . Chicken pox as a child  . Dermatitis 09/09/2014  . Ganglion cyst of left foot 09/09/2014  . H/O mumps   . H/O tobacco use, presenting hazards to health 10/08/2009   Qualifier: Diagnosis of  By: Linda Hedges MD, [employer]  Last cigarette 02/16/2016   . History of chicken pox   . Hyperglycemia 11/30/2015  . Hypertension   . Morbid obesity ([place]) 08/31/2009   Qualifier: Diagnosis of  By: Linda Hedges MD, [employer] Knuckles  BMI 34.4(Sept '13) BMI 34 (March '15)   . Mumps as a child  . Osteopenia 01/02/2017  . Osteoporosis 11/20/2016  . Psoriatic arthritis ([place])   . Rheumatoid arthritis ([place])   . Shingles 2014    Past Surgical History:    Procedure Laterality Date  . BACK SURGERY  2003  . NECK SURGERY  02/17/2016   C4, C5 & C6  . rotater cuff  20 yrs ago   left shoulder  . TOOTH EXTRACTION  05/2012  . WISDOM TOOTH EXTRACTION  59 yrs old    Family History  Problem Relation Age of Onset  . Parkinson's disease Mother   . Heart disease Mother   . Hypertension Mother   . Diabetes Father        type 2  . Hypertension Father   . Stroke Father   . Vascular Disease Brother   . Hypertension Brother   . Diabetes Brother   . Heart attack Paternal Uncle     Social History   Socioeconomic History  . Marital status: Divorced    Spouse name: Not on file  . Number of children: 0  . Years of education: Not on file  . Highest education level: Not on file  Occupational History  . Occupation: help Therapist, sports  Tobacco Use  . Smoking status: Former Smoker    Start date: 11/30/2015  . Smokeless tobacco: Never Used  Substance and Sexual Activity  . Alcohol use: Yes    Comment: occ wine  . Drug use: No  . Sexual activity: Never    Comment: no dietary restrictions. lives alone  Other Topics Concern  . Not on file  Social History  Narrative  . Not on file   Social Determinants of Health   Financial Resource Strain:   . Difficulty of Paying Living Expenses:   Food Insecurity:   . Worried About Charity fundraiser in the Last Year:   . Arboriculturist in the Last Year:   Transportation Needs:   . Film/video editor (Medical):   Marland Kitchen Lack of Transportation (Non-Medical):   Physical Activity:   . Days of Exercise per Week:   . Minutes of Exercise per Session:   Stress:   . Feeling of Stress :   Social Connections:   . Frequency of Communication with Friends and Family:   . Frequency of Social Gatherings with Friends and Family:   . Attends Religious Services:   . Active Member of Clubs or Organizations:   . Attends Archivist Meetings:   Marland Kitchen Marital Status:   Intimate Partner Violence:   . Fear  of Current or Ex-Partner:   . Emotionally Abused:   Marland Kitchen Physically Abused:   . Sexually Abused:     Outpatient Medications Prior to Visit  Medication Sig Dispense Refill  . albuterol (VENTOLIN HFA) 108 (90 Base) MCG/ACT inhaler INHALE 2 PUFFS BY MOUTH EVERY 6 HOURS AS NEEDED FOR WHEEZING 18 g 5  . citalopram (CELEXA) 20 MG tablet Take 1 tablet (20 mg total) by mouth daily. 90 tablet 1  . diltiazem (TIAZAC) 240 MG 24 hr capsule Take 1 capsule (240 mg total) by mouth daily. 90 capsule 1  . fluconazole (DIFLUCAN) 150 MG tablet Take 1 tablet (150 mg total) by mouth once a week. 4 tablet 1  . gabapentin (NEURONTIN) 600 MG tablet TAKE 1 TABLET(600 MG) BY MOUTH THREE TIMES DAILY 270 tablet 1  . hydrochlorothiazide (MICROZIDE) 12.5 MG capsule Take 1 capsule (12.5 mg total) by mouth daily. 90 capsule 1  . losartan (COZAAR) 50 MG tablet Take 1 tablet (50 mg total) by mouth daily. 90 tablet 1  . nystatin cream (MYCOSTATIN) Apply 1 application topically 2 (two) times daily as needed for dry skin. 30 g 0  . oxyCODONE-acetaminophen (PERCOCET) 10-325 MG tablet Take 2 tablets by mouth every 4 (four) hours as needed for pain. June 2021 180 tablet 0  . venlafaxine XR (EFFEXOR-XR) 75 MG 24 hr capsule Take 1 capsule po daily 90 capsule 1  . fentaNYL (DURAGESIC) 75 MCG/HR Place 1 patch onto the skin every 3 (three) days. June 2021 10 patch 0  . Baricitinib (OLUMIANT) 2 MG TABS Take 2 mg by mouth daily. (Patient not taking: Reported on 10/13/2019) 30 tablet 0   No facility-administered medications prior to visit.    Allergies  Allergen Reactions  . Simponi [Golimumab] Rash    States that it was systemic    Review of Systems  Constitutional: Negative for fever and malaise/fatigue.  HENT: Negative for congestion.   Eyes: Negative for blurred vision.  Respiratory: Negative for shortness of breath.   Cardiovascular: Negative for chest pain, palpitations and leg swelling.  Gastrointestinal: Negative for  abdominal pain, blood in stool and nausea.  Genitourinary: Negative for dysuria and frequency.  Musculoskeletal: Positive for back pain. Negative for falls.  Skin: Positive for rash.  Neurological: Negative for dizziness, loss of consciousness and headaches.  Endo/Heme/Allergies: Negative for environmental allergies.  Psychiatric/Behavioral: Positive for depression. The patient is nervous/anxious.        Objective:    Physical Exam Constitutional:      Appearance: Normal appearance. She is  not ill-appearing.  HENT:     Head: Normocephalic and atraumatic.     Nose: Nose normal.  Eyes:     General:        Right eye: No discharge.        Left eye: No discharge.  Pulmonary:     Effort: Pulmonary effort is normal.  Neurological:     Mental Status: She is alert and oriented to person, place, and time.  Psychiatric:        Behavior: Behavior normal.     There were no vitals taken for this visit. Wt Readings from Last 3 Encounters:  02/28/19 239 lb (108.4 kg)  02/11/18 238 lb (108 kg)  08/09/17 245 lb 12.8 oz (111.5 kg)    Diabetic Foot Exam - Simple   No data filed     Lab Results  Component Value Date   WBC 10.5 02/11/2018   HGB 14.4 02/11/2018   HCT 42.5 02/11/2018   PLT 243.0 02/11/2018   GLUCOSE 85 02/11/2018   CHOL 161 02/11/2018   TRIG 105.0 02/11/2018   HDL 68.90 02/11/2018   LDLCALC 71 02/11/2018   ALT 34 10/15/2019   AST 23 10/15/2019   NA 138 10/15/2019   K 3.8 10/15/2019   CL 101 10/15/2019   CREATININE 0.63 02/11/2018   BUN 20 02/11/2018   CO2 28 (A) 10/15/2019   TSH 1.07 02/11/2018   HGBA1C 5.4 10/15/2019    Lab Results  Component Value Date   TSH 1.07 02/11/2018   Lab Results  Component Value Date   WBC 10.5 02/11/2018   HGB 14.4 02/11/2018   HCT 42.5 02/11/2018   MCV 90.6 02/11/2018   PLT 243.0 02/11/2018   Lab Results  Component Value Date   NA 138 10/15/2019   K 3.8 10/15/2019   CO2 28 (A) 10/15/2019   GLUCOSE 85 02/11/2018    BUN 20 02/11/2018   CREATININE 0.63 02/11/2018   BILITOT 0.6 02/11/2018   ALKPHOS 86 10/15/2019   AST 23 10/15/2019   ALT 34 10/15/2019   PROT 7.6 02/11/2018   ALBUMIN 4.0 10/15/2019   CALCIUM 9.1 10/15/2019   ANIONGAP 8 11/26/2014   GFR 103.01 02/11/2018   Lab Results  Component Value Date   CHOL 161 02/11/2018   Lab Results  Component Value Date   HDL 68.90 02/11/2018   Lab Results  Component Value Date   LDLCALC 71 02/11/2018   Lab Results  Component Value Date   TRIG 105.0 02/11/2018   Lab Results  Component Value Date   CHOLHDL 2 02/11/2018   Lab Results  Component Value Date   HGBA1C 5.4 10/15/2019       Assessment & Plan:   Problem List Items Addressed This Visit    Depression with anxiety    She is still unemployed so her stress is high but she feels she is managing with the help of the Venlafaxine.       Hypertension    Monitor and no changes to meds. Encouraged heart healthy diet such as the DASH diet and exercise as tolerated.       Pain    Trying to stay active and managing with pain meds most days. Refill given for Fentanyl today      Dermatitis    The rash under breasts is better since she has been swimming in the ocean more. She does have some trouble with other rashes on arms etc but this has been better as well.  Hyperglycemia    hgba1c acceptable, minimize simple carbs. Increase exercise as tolerated.        Other Visit Diagnoses    Back pain, unspecified back location, unspecified back pain laterality, unspecified chronicity       Relevant Medications   fentaNYL (DURAGESIC) 75 MCG/HR      I have discontinued Tari M. Reny "Di"'s baricitinib. I am also having her maintain her hydrochlorothiazide, venlafaxine XR, diltiazem, losartan, citalopram, gabapentin, fluconazole, nystatin cream, albuterol, oxyCODONE-acetaminophen, and fentaNYL.  Meds ordered this encounter  Medications  . fentaNYL (DURAGESIC) 75 MCG/HR    Sig:  Place 1 patch onto the skin every 3 (three) days. June 2021    Dispense:  10 patch    Refill:  0    I discussed the assessment and treatment plan with the patient. The patient was provided an opportunity to ask questions and all were answered. The patient agreed with the plan and demonstrated an understanding of the instructions.   The patient was advised to call back or seek an in-person evaluation if the symptoms worsen or if the condition fails to improve as anticipated.  I provided 20 minutes of non-face-to-face time during this encounter.   Penni Homans, MD

## 2019-12-24 NOTE — Assessment & Plan Note (Signed)
The rash under breasts is better since she has been swimming in the ocean more. She does have some trouble with other rashes on arms etc but this has been better as well.

## 2019-12-24 NOTE — Assessment & Plan Note (Signed)
She is still unemployed so her stress is high but she feels she is managing with the help of the Venlafaxine.

## 2019-12-25 ENCOUNTER — Telehealth: Payer: Self-pay

## 2019-12-25 NOTE — Telephone Encounter (Signed)
PA initiated via Covermymeds; KEY:  [id number]. PA approved.   PA Case: [id number], Status: Approved, Coverage Starts on: 12/25/2019 12:00:00 AM, Coverage Ends on: 06/22/2020 12:00:00 AM

## 2019-12-26 ENCOUNTER — Encounter: Payer: Self-pay | Admitting: *Deleted

## 2019-12-26 LAB — C-REACTIVE PROTEIN: CRP: 12.5

## 2019-12-30 ENCOUNTER — Telehealth: Payer: BLUE CROSS/BLUE SHIELD | Admitting: Family Medicine

## 2020-01-12 ENCOUNTER — Telehealth: Payer: Self-pay | Admitting: Family Medicine

## 2020-01-12 ENCOUNTER — Other Ambulatory Visit: Payer: Self-pay | Admitting: Family Medicine

## 2020-01-12 DIAGNOSIS — R52 Pain, unspecified: Secondary | ICD-10-CM

## 2020-01-12 MED ORDER — OXYCODONE-ACETAMINOPHEN 10-325 MG PO TABS: 2.0000 | ORAL_TABLET | ORAL | 0 refills | Status: DC | PRN

## 2020-01-12 NOTE — Telephone Encounter (Signed)
Medication: oxyCODONE-acetaminophen (PERCOCET) 10-325 MG tablet   Has the patient contacted their pharmacy? No. (If no, request that the patient contact the pharmacy for the refill.) (If yes, when and what did the pharmacy advise?)  Preferred Pharmacy (with phone number or street name):  [facility], [place] Phone:  [phone]  Fax:  [phone]       Agent: Please be advised that RX refills may take up to 3 business days. We ask that you follow-up with your pharmacy.

## 2020-01-13 NOTE — Telephone Encounter (Signed)
Patient notified that was sent in.

## 2020-01-23 ENCOUNTER — Telehealth: Payer: Self-pay | Admitting: *Deleted

## 2020-01-23 NOTE — Telephone Encounter (Signed)
Left message on machine for patient to call back to schedule appointment.  Need to be scheduled for follow up before end of September.

## 2020-01-27 NOTE — Telephone Encounter (Signed)
Please call patient back

## 2020-01-27 NOTE — Telephone Encounter (Signed)
Patient just called me back but it looks like someone took they Thursday that we were looking at which was the 320pm.  Any other day that I could look at for her?

## 2020-01-27 NOTE — Telephone Encounter (Signed)
Appointment made for Friday at 12pm

## 2020-01-27 NOTE — Telephone Encounter (Signed)
You can add her virtually on Friday at 26

## 2020-01-28 ENCOUNTER — Other Ambulatory Visit: Payer: Self-pay | Admitting: Family Medicine

## 2020-01-28 DIAGNOSIS — M549 Dorsalgia, unspecified: Secondary | ICD-10-CM

## 2020-01-28 MED ORDER — FENTANYL 75 MCG/HR TD PT72: 1.0000 | MEDICATED_PATCH | TRANSDERMAL | 0 refills | Status: DC

## 2020-01-28 NOTE — Telephone Encounter (Signed)
done

## 2020-01-28 NOTE — Telephone Encounter (Signed)
Last UDS 10/15/19

## 2020-01-28 NOTE — Telephone Encounter (Signed)
New pharmacy   Medication:  fentaNYL ([place]) 53 MCG/HR [[id number]]      Has the patient contacted their pharmacy? (If no, request that the patient contact the pharmacy for the refill.) (If yes, when and what did the pharmacy advise?)     Preferred Pharmacy (with phone number or street name):  [id number], [place] - [address] [facility] Phone:  [phone]  Fax:  [phone]          Agent: Please be advised that RX refills may take up to 3 business days. We ask that you follow-up with your pharmacy.

## 2020-01-28 NOTE — Telephone Encounter (Signed)
Last written:12/24/19 Last ov:12/28/19 Next ov:01/30/20 Contract: UDS:

## 2020-01-29 NOTE — Telephone Encounter (Signed)
Patient notified that rx has been set up.

## 2020-01-30 ENCOUNTER — Telehealth (INDEPENDENT_AMBULATORY_CARE_PROVIDER_SITE_OTHER): Payer: BLUE CROSS/BLUE SHIELD | Admitting: Family Medicine

## 2020-01-30 ENCOUNTER — Other Ambulatory Visit: Payer: Self-pay

## 2020-01-30 DIAGNOSIS — G894 Chronic pain syndrome: Secondary | ICD-10-CM

## 2020-01-30 DIAGNOSIS — L309 Dermatitis, unspecified: Secondary | ICD-10-CM | POA: Diagnosis not present

## 2020-01-30 DIAGNOSIS — F418 Other specified anxiety disorders: Secondary | ICD-10-CM

## 2020-01-30 DIAGNOSIS — R739 Hyperglycemia, unspecified: Secondary | ICD-10-CM

## 2020-01-30 DIAGNOSIS — I10 Essential (primary) hypertension: Secondary | ICD-10-CM | POA: Diagnosis not present

## 2020-01-30 DIAGNOSIS — R52 Pain, unspecified: Secondary | ICD-10-CM

## 2020-01-30 MED ORDER — DILTIAZEM HCL ER BEADS 240 MG PO CP24: 240.0000 mg | ORAL_CAPSULE | Freq: Every day | ORAL | 1 refills | Status: DC

## 2020-01-30 MED ORDER — LOSARTAN POTASSIUM 50 MG PO TABS: 50.0000 mg | ORAL_TABLET | Freq: Every day | ORAL | 1 refills | Status: DC

## 2020-01-30 MED ORDER — VENLAFAXINE HCL ER 75 MG PO CP24: ORAL_CAPSULE | ORAL | 1 refills | Status: DC

## 2020-01-30 MED ORDER — GABAPENTIN 600 MG PO TABS
ORAL_TABLET | ORAL | 1 refills | Status: DC
Start: 2020-01-30 — End: 2020-08-05

## 2020-01-30 MED ORDER — HYDROCHLOROTHIAZIDE 12.5 MG PO CAPS: 12.5000 mg | ORAL_CAPSULE | Freq: Every day | ORAL | 1 refills | Status: DC

## 2020-01-30 NOTE — Assessment & Plan Note (Signed)
hgba1c acceptable, minimize simple carbs. Increase exercise as tolerated.  

## 2020-01-30 NOTE — Assessment & Plan Note (Signed)
Left hip pain is intermittent.

## 2020-01-30 NOTE — Assessment & Plan Note (Signed)
Monitor and report any concerns. no changes to meds. Encouraged heart healthy diet such as the DASH diet and exercise as tolerated.  

## 2020-01-30 NOTE — Assessment & Plan Note (Signed)
Candidal skin infection is better under the breasts after spending a couple of months at the beach. She will let us know if worsens.

## 2020-02-02 NOTE — Assessment & Plan Note (Signed)
Continues to struggle with daily pain but is able to work and complete ADLs on current regimen which has been stable a long time. No changes today

## 2020-02-02 NOTE — Assessment & Plan Note (Signed)
She continues to struggle with anxiety but she has a number of leads on a new job and she feels that will help. No changes to meds at this time. Given refill on Venlaxine at current dose.

## 2020-02-02 NOTE — Progress Notes (Signed)
Virtual Visit via Video Note  I connected with Jennifer Kelly on 01/30/20 at 12:00 PM EDT by a video enabled telemedicine application and verified that I am speaking with the correct person using two identifiers.  Location: Patient: home, patient and provider in visit Provider: home   I discussed the limitations of evaluation and management by telemedicine and the availability of in person appointments. The patient expressed understanding and agreed to proceed. Kem Boroughs, CMA was able to set the patient up on a video visit.     Subjective:    Patient ID: Jennifer Kelly, female    DOB: 09-Apr-1961, 59 y.o.   MRN: [id number]  Chief Complaint  Patient presents with  . Follow-up    HPI Patient is in today for follow up on chronic medical concerns. No recent febrile illness or hospitalizations. She is struggling with 3 possible job offers and is very anxious about which offer to take. Her pain continues to be adequately managed on current medications. She denies any polyuria or polydipsia. Denies CP/palp/SOB/HA/congestion/fevers/GI or GU c/o. Taking meds as prescribed  Past Medical History:  Diagnosis Date  . Acute bronchitis 01/05/2014  . Arthritis   . Chicken pox as a child  . Dermatitis 09/09/2014  . Ganglion cyst of left foot 09/09/2014  . H/O mumps   . H/O tobacco use, presenting hazards to health 10/08/2009   Qualifier: Diagnosis of  By: Linda Hedges MD, Heinz Knuckles  Last cigarette 02/16/2016   . History of chicken pox   . Hyperglycemia 11/30/2015  . Hypertension   . Morbid obesity ([place]) 08/31/2009   Qualifier: Diagnosis of  By: Linda Hedges MD, Heinz Knuckles  BMI 34.4(Sept '13) BMI 34 (March '15)   . Mumps as a child  . Osteopenia 01/02/2017  . Osteoporosis 11/20/2016  . Psoriatic arthritis ([place])   . Rheumatoid arthritis ([place])   . Shingles 2014    Past Surgical History:  Procedure Laterality Date  . BACK SURGERY  2003  . NECK SURGERY  02/17/2016   C4, C5 & C6  . rotater cuff  20 yrs  ago   left shoulder  . TOOTH EXTRACTION  05/2012  . WISDOM TOOTH EXTRACTION  59 yrs old    Family History  Problem Relation Age of Onset  . Parkinson's disease Mother   . Heart disease Mother   . Hypertension Mother   . Diabetes Father        type 2  . Hypertension Father   . Stroke Father   . Vascular Disease Brother   . Hypertension Brother   . Diabetes Brother   . Heart attack Paternal Uncle     Social History   Socioeconomic History  . Marital status: Divorced    Spouse name: Not on file  . Number of children: 0  . Years of education: Not on file  . Highest education level: Not on file  Occupational History  . Occupation: help Therapist, sports  Tobacco Use  . Smoking status: Former Smoker    Start date: 11/30/2015  . Smokeless tobacco: Never Used  Substance and Sexual Activity  . Alcohol use: Yes    Comment: occ wine  . Drug use: No  . Sexual activity: Never    Comment: no dietary restrictions. lives alone  Other Topics Concern  . Not on file  Social History Narrative  . Not on file   Social Determinants of Health   Financial Resource Strain:   . Difficulty of Paying Living Expenses:  Not on file  Food Insecurity:   . Worried About Charity fundraiser in the Last Year: Not on file  . Ran Out of Food in the Last Year: Not on file  Transportation Needs:   . Lack of Transportation (Medical): Not on file  . Lack of Transportation (Non-Medical): Not on file  Physical Activity:   . Days of Exercise per Week: Not on file  . Minutes of Exercise per Session: Not on file  Stress:   . Feeling of Stress : Not on file  Social Connections:   . Frequency of Communication with Friends and Family: Not on file  . Frequency of Social Gatherings with Friends and Family: Not on file  . Attends Religious Services: Not on file  . Active Member of Clubs or Organizations: Not on file  . Attends Archivist Meetings: Not on file  . Marital Status: Not on file    Intimate Partner Violence:   . Fear of Current or Ex-Partner: Not on file  . Emotionally Abused: Not on file  . Physically Abused: Not on file  . Sexually Abused: Not on file    Outpatient Medications Prior to Visit  Medication Sig Dispense Refill  . albuterol (VENTOLIN HFA) 108 (90 Base) MCG/ACT inhaler INHALE 2 PUFFS BY MOUTH EVERY 6 HOURS AS NEEDED FOR WHEEZING 18 g 5  . citalopram (CELEXA) 20 MG tablet Take 1 tablet (20 mg total) by mouth daily. 90 tablet 1  . fentaNYL (DURAGESIC) 75 MCG/HR Place 1 patch onto the skin every 3 (three) days. June 2021 10 patch 0  . fluconazole (DIFLUCAN) 150 MG tablet Take 1 tablet (150 mg total) by mouth once a week. 4 tablet 1  . nystatin cream (MYCOSTATIN) Apply 1 application topically 2 (two) times daily as needed for dry skin. 30 g 0  . oxyCODONE-acetaminophen (PERCOCET) 10-325 MG tablet Take 2 tablets by mouth every 4 (four) hours as needed for pain. June 2021 180 tablet 0  . diltiazem (TIAZAC) 240 MG 24 hr capsule Take 1 capsule (240 mg total) by mouth daily. 90 capsule 1  . gabapentin (NEURONTIN) 600 MG tablet TAKE 1 TABLET(600 MG) BY MOUTH THREE TIMES DAILY 270 tablet 1  . hydrochlorothiazide (MICROZIDE) 12.5 MG capsule Take 1 capsule (12.5 mg total) by mouth daily. 90 capsule 1  . losartan (COZAAR) 50 MG tablet Take 1 tablet (50 mg total) by mouth daily. 90 tablet 1  . venlafaxine XR (EFFEXOR-XR) 75 MG 24 hr capsule Take 1 capsule po daily 90 capsule 1   No facility-administered medications prior to visit.    Allergies  Allergen Reactions  . Simponi [Golimumab] Rash    States that it was systemic    Review of Systems  Constitutional: Negative for fever and malaise/fatigue.  HENT: Negative for congestion.   Eyes: Negative for blurred vision.  Respiratory: Negative for shortness of breath.   Cardiovascular: Negative for chest pain, palpitations and leg swelling.  Gastrointestinal: Negative for abdominal pain, blood in stool and nausea.   Genitourinary: Negative for dysuria and frequency.  Musculoskeletal: Positive for back pain, joint pain and myalgias. Negative for falls.  Skin: Negative for rash.  Neurological: Negative for dizziness, loss of consciousness and headaches.  Endo/Heme/Allergies: Negative for environmental allergies.  Psychiatric/Behavioral: Positive for depression. Negative for hallucinations, substance abuse and suicidal ideas. The patient is nervous/anxious.        Objective:    Physical Exam Constitutional:      Appearance: Normal appearance.  She is not ill-appearing.  HENT:     Head: Normocephalic and atraumatic.     Right Ear: External ear normal.     Left Ear: External ear normal.     Nose: Nose normal.  Eyes:     General:        Right eye: No discharge.        Left eye: No discharge.  Pulmonary:     Effort: Pulmonary effort is normal.  Neurological:     Mental Status: She is alert and oriented to person, place, and time.  Psychiatric:        Behavior: Behavior normal.     There were no vitals taken for this visit. Wt Readings from Last 3 Encounters:  02/28/19 239 lb (108.4 kg)  02/11/18 238 lb (108 kg)  08/09/17 245 lb 12.8 oz (111.5 kg)    Diabetic Foot Exam - Simple   No data filed     Lab Results  Component Value Date   WBC 9.5 10/15/2019   HGB 15.1 10/15/2019   HCT 45 10/15/2019   PLT 208 10/15/2019   GLUCOSE 85 02/11/2018   CHOL 161 02/11/2018   TRIG 105.0 02/11/2018   HDL 68.90 02/11/2018   LDLCALC 71 02/11/2018   ALT 34 10/15/2019   AST 23 10/15/2019   NA 138 10/15/2019   K 3.8 10/15/2019   CL 101 10/15/2019   CREATININE 0.63 02/11/2018   BUN 20 02/11/2018   CO2 28 (A) 10/15/2019   TSH 2.06 10/15/2019   HGBA1C 5.4 10/15/2019   HGBA1C 5.4 10/15/2019    Lab Results  Component Value Date   TSH 2.06 10/15/2019   Lab Results  Component Value Date   WBC 9.5 10/15/2019   HGB 15.1 10/15/2019   HCT 45 10/15/2019   MCV 90.6 02/11/2018   PLT 208  10/15/2019   Lab Results  Component Value Date   NA 138 10/15/2019   K 3.8 10/15/2019   CO2 28 (A) 10/15/2019   GLUCOSE 85 02/11/2018   BUN 20 02/11/2018   CREATININE 0.63 02/11/2018   BILITOT 0.6 02/11/2018   ALKPHOS 86 10/15/2019   AST 23 10/15/2019   ALT 34 10/15/2019   PROT 7.6 02/11/2018   ALBUMIN 4.0 10/15/2019   CALCIUM 9.1 10/15/2019   ANIONGAP 8 11/26/2014   GFR 103.01 02/11/2018   Lab Results  Component Value Date   CHOL 161 02/11/2018   Lab Results  Component Value Date   HDL 68.90 02/11/2018   Lab Results  Component Value Date   LDLCALC 71 02/11/2018   Lab Results  Component Value Date   TRIG 105.0 02/11/2018   Lab Results  Component Value Date   CHOLHDL 2 02/11/2018   Lab Results  Component Value Date   HGBA1C 5.4 10/15/2019   HGBA1C 5.4 10/15/2019       Assessment & Plan:   Problem List Items Addressed This Visit    Depression with anxiety    She continues to struggle with anxiety but she has a number of leads on a new job and she feels that will help. No changes to meds at this time. Given refill on Venlaxine at current dose.       Relevant Medications   venlafaxine XR (EFFEXOR-XR) 75 MG 24 hr capsule   Hypertension    Monitor and report any concerns.  no changes to meds. Encouraged heart healthy diet such as the DASH diet and exercise as tolerated.  Relevant Medications   losartan (COZAAR) 50 MG tablet   hydrochlorothiazide (MICROZIDE) 12.5 MG capsule   diltiazem (TIAZAC) 240 MG 24 hr capsule   Chronic pain syndrome    Continues to struggle with daily pain but is able to work and complete ADLs on current regimen which has been stable a long time. No changes today      Relevant Medications   venlafaxine XR (EFFEXOR-XR) 75 MG 24 hr capsule   gabapentin (NEURONTIN) 600 MG tablet   Pain    Left hip pain is intermittent.       Dermatitis    Candidal skin infection is better under the breasts after spending a couple of months  at the beach. She will let us know if worsens.      Hyperglycemia    hgba1c acceptable, minimize simple carbs. Increase exercise as tolerated.          I am having Valera M. Down "Di" maintain her citalopram, fluconazole, nystatin cream, albuterol, oxyCODONE-acetaminophen, fentaNYL, venlafaxine XR, losartan, hydrochlorothiazide, diltiazem, and gabapentin.  Meds ordered this encounter  Medications  . venlafaxine XR (EFFEXOR-XR) 75 MG 24 hr capsule    Sig: Take 1 capsule po daily    Dispense:  90 capsule    Refill:  1  . losartan (COZAAR) 50 MG tablet    Sig: Take 1 tablet (50 mg total) by mouth daily.    Dispense:  90 tablet    Refill:  1  . hydrochlorothiazide (MICROZIDE) 12.5 MG capsule    Sig: Take 1 capsule (12.5 mg total) by mouth daily.    Dispense:  90 capsule    Refill:  1  . diltiazem (TIAZAC) 240 MG 24 hr capsule    Sig: Take 1 capsule (240 mg total) by mouth daily.    Dispense:  90 capsule    Refill:  1  . gabapentin (NEURONTIN) 600 MG tablet    Sig: TAKE 1 TABLET(600 MG) BY MOUTH THREE TIMES DAILY    Dispense:  270 tablet    Refill:  1    I discussed the assessment and treatment plan with the patient. The patient was provided an opportunity to ask questions and all were answered. The patient agreed with the plan and demonstrated an understanding of the instructions.   The patient was advised to call back or seek an in-person evaluation if the symptoms worsen or if the condition fails to improve as anticipated.  I provided 20 minutes of non-face-to-face time during this encounter.   Penni Homans, MD

## 2020-02-10 ENCOUNTER — Telehealth: Payer: Self-pay | Admitting: Family Medicine

## 2020-02-10 NOTE — Telephone Encounter (Signed)
Medication: oxyCODONE-acetaminophen (PERCOCET) 10-325 MG tablet [[id number]]       Has the patient contacted their pharmacy?  (If no, request that the patient contact the pharmacy for the refill.) (If yes, when and what did the pharmacy advise?)     Preferred Pharmacy (with phone number or street name): [facility] #[id number] [address], [place] [address] AT [address] [address] [place] [address]  Phone:  [phone] Fax:  [phone]     Agent: Please be advised that RX refills may take up to 3 business days. We ask that you follow-up with your pharmacy.

## 2020-02-11 ENCOUNTER — Other Ambulatory Visit: Payer: Self-pay | Admitting: Family Medicine

## 2020-02-11 DIAGNOSIS — R52 Pain, unspecified: Secondary | ICD-10-CM

## 2020-02-11 MED ORDER — OXYCODONE-ACETAMINOPHEN 10-325 MG PO TABS: 2.0000 | ORAL_TABLET | ORAL | 0 refills | Status: DC | PRN

## 2020-02-11 NOTE — Telephone Encounter (Signed)
Requesting: oxycodone  Contract:07/05/2017 UDS: 10/15/19 Last Visit:01/1020 Next Visit:08/17/20 Last Refill:01/12/20  Please Advise

## 2020-02-11 NOTE — Telephone Encounter (Signed)
done

## 2020-02-12 ENCOUNTER — Encounter: Payer: Self-pay | Admitting: Family Medicine

## 2020-02-12 ENCOUNTER — Other Ambulatory Visit: Payer: Self-pay | Admitting: Family Medicine

## 2020-02-12 DIAGNOSIS — R52 Pain, unspecified: Secondary | ICD-10-CM

## 2020-02-12 MED ORDER — OXYCODONE-ACETAMINOPHEN 10-325 MG PO TABS: 2.0000 | ORAL_TABLET | ORAL | 0 refills | Status: DC | PRN

## 2020-02-12 NOTE — Telephone Encounter (Signed)
Patient states she is about not to have any medical insurance. She is going to be cash for her medication and would like her prescription to go to   [facility] # [place], [address] Phone:  [phone]  Fax:  [phone]     Patient states she was going to contact [facility] to cancel.

## 2020-02-13 NOTE — Telephone Encounter (Signed)
Patient notified

## 2020-02-26 ENCOUNTER — Telehealth: Payer: Self-pay | Admitting: Family Medicine

## 2020-02-26 NOTE — Telephone Encounter (Signed)
Medication: fentaNYL ([place]) 75 MCG/HR [[id number]   Has the patient contacted their pharmacy? No. (If no, request that the patient contact the pharmacy for the refill.) (If yes, when and what did the pharmacy advise?)  Preferred Pharmacy (with phone number or street name):  [facility] [id number] [place], [address] [place]. Phone:  [phone]  Fax:  [phone]       Agent: Please be advised that RX refills may take up to 3 business days. We ask that you follow-up with your pharmacy.

## 2020-02-27 ENCOUNTER — Other Ambulatory Visit: Payer: Self-pay | Admitting: Family Medicine

## 2020-02-27 DIAGNOSIS — M549 Dorsalgia, unspecified: Secondary | ICD-10-CM

## 2020-02-27 MED ORDER — FENTANYL 75 MCG/HR TD PT72: 1.0000 | MEDICATED_PATCH | TRANSDERMAL | 0 refills | Status: DC

## 2020-02-27 NOTE — Telephone Encounter (Signed)
Done

## 2020-02-27 NOTE — Telephone Encounter (Signed)
Last written:01/28/20 Last ov:01/25/20 Next ov:01/2021 Contract:none UDS:

## 2020-03-02 ENCOUNTER — Telehealth: Payer: Self-pay | Admitting: Family Medicine

## 2020-03-02 NOTE — Telephone Encounter (Signed)
[facility] [address] [facility] on [facility] order till end of October. Patient is asking if you could [facility]    fentaNYL ([place]) 75 MCG/HR [[id number]]    [id number] [address], [place] Phone:  [phone]  Fax:  [phone]

## 2020-03-03 ENCOUNTER — Other Ambulatory Visit: Payer: Self-pay | Admitting: Family Medicine

## 2020-03-03 DIAGNOSIS — M549 Dorsalgia, unspecified: Secondary | ICD-10-CM

## 2020-03-03 MED ORDER — FENTANYL 75 MCG/HR TD PT72: 1.0000 | MEDICATED_PATCH | TRANSDERMAL | 0 refills | Status: DC

## 2020-03-03 NOTE — Telephone Encounter (Signed)
Patches would like patches sent [facility] a different [facility] since the one pt usually use the patches are on back order.  Need to resend to [facility].

## 2020-03-10 ENCOUNTER — Telehealth: Payer: Self-pay | Admitting: Family Medicine

## 2020-03-10 ENCOUNTER — Other Ambulatory Visit: Payer: Self-pay | Admitting: Family Medicine

## 2020-03-10 DIAGNOSIS — R52 Pain, unspecified: Secondary | ICD-10-CM

## 2020-03-10 MED ORDER — OXYCODONE-ACETAMINOPHEN 10-325 MG PO TABS: 2.0000 | ORAL_TABLET | ORAL | 0 refills | Status: DC | PRN

## 2020-03-10 NOTE — Telephone Encounter (Signed)
Last written:01/30/20 Last ov:01/30/20 Next ov:08/17/20 Contract: UDS:10/15/19

## 2020-03-10 NOTE — Telephone Encounter (Signed)
done

## 2020-03-10 NOTE — Telephone Encounter (Signed)
Medication: oxyCODONE-acetaminophen (PERCOCET) 10-325 MG tablet [[id number]]       Has the patient contacted their pharmacy? (If no, request that the patient contact the pharmacy for the refill.) (If yes, when and what did the pharmacy advise?)     Preferred Pharmacy (with phone number or street name): [facility] [address], [place].  [address] [place] [address]  Phone:  [phone] Fax:  [phone]      Agent: Please be advised that RX refills may take up to 3 business days. We ask that you follow-up with your pharmacy.

## 2020-03-31 ENCOUNTER — Other Ambulatory Visit: Payer: Self-pay | Admitting: Family Medicine

## 2020-03-31 ENCOUNTER — Telehealth: Payer: Self-pay | Admitting: Family Medicine

## 2020-03-31 DIAGNOSIS — M549 Dorsalgia, unspecified: Secondary | ICD-10-CM

## 2020-03-31 MED ORDER — FENTANYL 75 MCG/HR TD PT72: 1.0000 | MEDICATED_PATCH | TRANSDERMAL | 0 refills | Status: DC

## 2020-03-31 NOTE — Telephone Encounter (Signed)
Requesting: fentaNYL (DURAGESIC) 75 MCG/HR  Contract:10/15/19 UDS:10/15/19 Last Visit:10/13/19 Next Visit:07/2020 Last Refill:02/2020  Please Advise

## 2020-03-31 NOTE — Telephone Encounter (Signed)
done

## 2020-03-31 NOTE — Telephone Encounter (Signed)
Medication: fentaNYL ([place]) 75 MCG/HR [[id number]]     Has the patient contacted their pharmacy?  (If no, request that the patient contact the pharmacy for the refill.) (If yes, when and what did the pharmacy advise?)     Preferred Pharmacy (with phone number or street name):   [facility] #[id number] [address], [place] [address] AT [address] [address] [place] [address]  Phone:  [phone] Fax:  [phone]   Agent: Please be advised that RX refills may take up to 3 business days. We ask that you follow-up with your pharmacy.

## 2020-04-01 ENCOUNTER — Encounter: Payer: Self-pay | Admitting: Family Medicine

## 2020-04-02 ENCOUNTER — Telehealth: Payer: Self-pay

## 2020-04-02 NOTE — Telephone Encounter (Signed)
PA initiated via Covermymeds; KEY: [id number]. PA approved.   PA Case: [id number], Status: Approved, Coverage Starts on: 04/02/2020 12:00:00 AM, Coverage Ends on: 09/29/2020 12:00:00 AM

## 2020-04-07 ENCOUNTER — Other Ambulatory Visit: Payer: Self-pay | Admitting: Family Medicine

## 2020-04-07 ENCOUNTER — Telehealth: Payer: Self-pay

## 2020-04-07 DIAGNOSIS — R52 Pain, unspecified: Secondary | ICD-10-CM

## 2020-04-07 MED ORDER — OXYCODONE-ACETAMINOPHEN 10-325 MG PO TABS: 2.0000 | ORAL_TABLET | ORAL | 0 refills | Status: DC | PRN

## 2020-04-07 NOTE — Telephone Encounter (Signed)
Refill Request:   MEDICATION: oxycodone 325 MG  [facility]AC[facility] [place]  Comments:   **Let patient know to contact pharmacy at the end of the day to make sure medication is ready. **  ** Please notify patient to allow 48-72 hours to process**  **Encourage patient to contact the pharmacy for refills or they can request refills through [facility]**

## 2020-04-07 NOTE — Telephone Encounter (Signed)
done

## 2020-04-07 NOTE — Telephone Encounter (Signed)
Requesting:oxycodone 325 MG Contract:10/15/19 UDS:10/15/19 Last Visit:01/30/20 Next Visit:08/17/20 Last Refill:03/10/20  Please Advise

## 2020-04-30 ENCOUNTER — Telehealth: Payer: Self-pay | Admitting: Family Medicine

## 2020-04-30 NOTE — Telephone Encounter (Signed)
Last written: 03/31/20 Last ov: 01/30/20 Next ov: 08/17/20 Contract:  UDS: 10/15/19

## 2020-04-30 NOTE — Telephone Encounter (Signed)
Medication: fentaNYL ([place]) 75 MCG/HR [[id number]  Has the patient contacted their pharmacy? No. (If no, request that the patient contact the pharmacy for the refill.) (If yes, when and what did the pharmacy advise?)  Preferred Pharmacy (with phone number or street name):  [facility] #[id number] [address], [place] - [address] AT [address] [address] [address]  Phone:  [phone] Fax:  [phone]  DEA #:  [id number]  DAW Reason: --    Agent: Please be advised that RX refills may take up to 3 business days. We ask that you follow-up with your pharmacy.

## 2020-05-02 ENCOUNTER — Other Ambulatory Visit: Payer: Self-pay | Admitting: Family Medicine

## 2020-05-02 DIAGNOSIS — M549 Dorsalgia, unspecified: Secondary | ICD-10-CM

## 2020-05-02 MED ORDER — FENTANYL 75 MCG/HR TD PT72: 1.0000 | MEDICATED_PATCH | TRANSDERMAL | 0 refills | Status: DC

## 2020-05-02 NOTE — Telephone Encounter (Signed)
done

## 2020-05-04 ENCOUNTER — Telehealth: Payer: Self-pay

## 2020-05-04 NOTE — Telephone Encounter (Signed)
Pt called requesting refill on oxycodone-acetaminophen.  Pharmacy is [facility], [place] in [place], [place].

## 2020-05-05 ENCOUNTER — Other Ambulatory Visit: Payer: Self-pay | Admitting: Family Medicine

## 2020-05-05 DIAGNOSIS — R52 Pain, unspecified: Secondary | ICD-10-CM

## 2020-05-05 MED ORDER — OXYCODONE-ACETAMINOPHEN 10-325 MG PO TABS: 2.0000 | ORAL_TABLET | ORAL | 0 refills | Status: DC | PRN

## 2020-05-05 NOTE — Telephone Encounter (Signed)
Pt.notified

## 2020-05-05 NOTE — Telephone Encounter (Signed)
Requesting: oxycodone Contract: n/a UDS:01/2018 Last Visit: 01/2020 Next Visit:07/2020 Last Refill:04/07/20  Please Advise

## 2020-05-05 NOTE — Telephone Encounter (Signed)
Done

## 2020-05-08 ENCOUNTER — Other Ambulatory Visit: Payer: Self-pay | Admitting: Family Medicine

## 2020-05-31 ENCOUNTER — Other Ambulatory Visit: Payer: Self-pay

## 2020-05-31 ENCOUNTER — Other Ambulatory Visit: Payer: Self-pay | Admitting: Family Medicine

## 2020-05-31 ENCOUNTER — Telehealth: Payer: Self-pay

## 2020-05-31 DIAGNOSIS — M549 Dorsalgia, unspecified: Secondary | ICD-10-CM

## 2020-05-31 DIAGNOSIS — R52 Pain, unspecified: Secondary | ICD-10-CM

## 2020-05-31 DIAGNOSIS — L309 Dermatitis, unspecified: Secondary | ICD-10-CM

## 2020-05-31 MED ORDER — NYSTATIN 100000 UNIT/GM EX CREA: 1.0000 "application " | TOPICAL_CREAM | Freq: Two times a day (BID) | CUTANEOUS | 0 refills | Status: DC | PRN

## 2020-05-31 MED ORDER — FENTANYL 75 MCG/HR TD PT72: 1.0000 | MEDICATED_PATCH | TRANSDERMAL | 0 refills | Status: DC

## 2020-05-31 MED ORDER — FLUCONAZOLE 150 MG PO TABS: 150.0000 mg | ORAL_TABLET | ORAL | 1 refills | Status: DC

## 2020-05-31 NOTE — Telephone Encounter (Signed)
fentaNYL (DURAGESIC) 75 MCG/HR and Diflucan

## 2020-05-31 NOTE — Telephone Encounter (Signed)
Attempted to call pt to see if she check to see if she had refills on inhaler.

## 2020-05-31 NOTE — Telephone Encounter (Signed)
Pt called in needing medications sent in. I sent in nystatin cream in but I forgot to ask if it was ok. She also needed other medications I just pend them in her chart.

## 2020-05-31 NOTE — Telephone Encounter (Signed)
The Nystatin prescription is fine but I cannot see the pended meds. What else does she need?

## 2020-05-31 NOTE — Telephone Encounter (Signed)
Patient would like  her prescription to be sent to [facility]   she also need the medicine for the rash under her breast  Medication:albuterol (VENTOLIN HFA) 108 (90 Base) MCG/ACT inhaler [[id number]]      Has the patient contacted their pharmacy? no (If no, request that the patient contact the pharmacy for the refill.) (If yes, when and what did the pharmacy advise?)    Preferred Pharmacy (with phone number or street name):  [facility] #[id number] [address], [place] - [address] AT [address] [address]  Phone:  [phone] Fax:  [phone]     Agent: Please be advised that RX refills may take up to 3 business days. We ask that you follow-up with your pharmacy.

## 2020-06-01 MED ORDER — FLUCONAZOLE 150 MG PO TABS
150.0000 mg | ORAL_TABLET | Freq: Once | ORAL | 1 refills | Status: DC
Start: 2020-06-01 — End: 2024-02-17

## 2020-06-01 NOTE — Telephone Encounter (Signed)
duragesic filled already

## 2020-06-02 ENCOUNTER — Encounter: Payer: Self-pay | Admitting: Family Medicine

## 2020-06-02 MED ORDER — OXYCODONE-ACETAMINOPHEN 10-325 MG PO TABS: 2.0000 | ORAL_TABLET | ORAL | 0 refills | Status: DC | PRN

## 2020-06-02 NOTE — Telephone Encounter (Signed)
Medication: oxyCODONE-acetaminophen (PERCOCET) 10-325 MG tablet [[id number]]    Has the patient contacted their pharmacy? no (If no, request that the patient contact the pharmacy for the refill.) (If yes, when and what did the pharmacy advise?)    Preferred Pharmacy (with phone number or street name):  [facility] # [address], [place].  [place] [place] [address]  Phone:  [phone] Fax:  [phone]      Agent: Please be advised that RX refills may take up to 3 business days. We ask that you follow-up with your pharmacy.

## 2020-06-02 NOTE — Telephone Encounter (Signed)
Requesting: oxycodone 10-325mg  Contract: 07/05/2017 UDS: 10/15/2019 Last Visit: 01/30/2020 Next Visit: 08/17/2020 Last Refill: 05/05/2020 #180 and 0RF  Please Advise

## 2020-06-03 ENCOUNTER — Other Ambulatory Visit: Payer: Self-pay | Admitting: Family Medicine

## 2020-06-03 DIAGNOSIS — R52 Pain, unspecified: Secondary | ICD-10-CM

## 2020-06-03 MED ORDER — OXYCODONE-ACETAMINOPHEN 10-325 MG PO TABS: 2.0000 | ORAL_TABLET | ORAL | 0 refills | Status: DC | PRN

## 2020-06-03 MED ORDER — OXYCODONE-ACETAMINOPHEN 10-325 MG PO TABS
2.0000 | ORAL_TABLET | ORAL | 0 refills | Status: DC | PRN
Start: 2020-06-03 — End: 2020-07-01

## 2020-06-03 NOTE — Telephone Encounter (Signed)
Patient would like medication  Sent to [facility] in [place]    Please resend

## 2020-06-28 ENCOUNTER — Telehealth: Payer: Self-pay | Admitting: Family Medicine

## 2020-06-28 ENCOUNTER — Other Ambulatory Visit: Payer: Self-pay | Admitting: Family Medicine

## 2020-06-28 DIAGNOSIS — M549 Dorsalgia, unspecified: Secondary | ICD-10-CM

## 2020-06-28 MED ORDER — FENTANYL 75 MCG/HR TD PT72: 1.0000 | MEDICATED_PATCH | TRANSDERMAL | 0 refills | Status: DC

## 2020-06-28 NOTE — Telephone Encounter (Signed)
Requesting:fentanyl 75 mcg Contract: Unknown UDS:10/15/19 Last Visit:01/30/20 Next Visit:08/17/20 Last Refill:05/31/20  Please Advise

## 2020-06-28 NOTE — Telephone Encounter (Signed)
done

## 2020-06-28 NOTE — Telephone Encounter (Signed)
Medication: fentaNYL ([place]) 75 MCG/HR [[id number]]       Has the patient contacted their pharmacy?  (If no, request that the patient contact the pharmacy for the refill.) (If yes, when and what did the pharmacy advise?)     Preferred Pharmacy (with phone number or street name): [facility] #[id number] [address], [place] - [address] [place] [address] [place] [address]  Phone:  [phone] Fax:  [phone]  DEA #:       Agent: Please be advised that RX refills may take up to 3 business days. We ask that you follow-up with your pharmacy.

## 2020-06-29 ENCOUNTER — Telehealth: Payer: Self-pay | Admitting: Family Medicine

## 2020-06-29 NOTE — Telephone Encounter (Signed)
PA already on file.   From 04/02/2020 12:00:00 AM, Coverage Ends on: 09/29/2020 12:00:00 AM

## 2020-06-29 NOTE — Telephone Encounter (Signed)
Patient notified

## 2020-06-29 NOTE — Telephone Encounter (Signed)
Prior auth on file for patches are good til may of this year

## 2020-06-29 NOTE — Telephone Encounter (Signed)
Patient called states that her medication needs a prior auth. [facility] should have sent a fax per patient.   fentaNYL ([place]) 75 MCG/HR [[id number]]

## 2020-07-01 ENCOUNTER — Telehealth: Payer: Self-pay | Admitting: Family Medicine

## 2020-07-01 ENCOUNTER — Other Ambulatory Visit: Payer: Self-pay | Admitting: Family Medicine

## 2020-07-01 DIAGNOSIS — R52 Pain, unspecified: Secondary | ICD-10-CM

## 2020-07-01 MED ORDER — OXYCODONE-ACETAMINOPHEN 10-325 MG PO TABS: 2.0000 | ORAL_TABLET | ORAL | 0 refills | Status: DC | PRN

## 2020-07-01 NOTE — Telephone Encounter (Signed)
Requesting:percocet 10-325mg  Contract: UDS:10/15/19 Last Visit:01/23/20 Next Visit:08/17/20 Last Refill:06/03/20  Please Advise

## 2020-07-01 NOTE — Telephone Encounter (Signed)
done

## 2020-07-01 NOTE — Telephone Encounter (Signed)
Patient notified and pharmacy notified of approval. PA Case: [id number], Status: Approved, Coverage Starts on: 07/01/2020 12:00:00 AM, Coverage Ends on: 12/28/2020 12:00:00 AM.

## 2020-07-01 NOTE — Telephone Encounter (Signed)
Patient know that she is not due until 07/04/2020 but [facility] will be [facility] on [facility] 07/04/2020, patient wanted to get her request in before that.  Medication: oxyCODONE-acetaminophen (PERCOCET) 10-325 MG tablet [[id number]]    Has the patient contacted their pharmacy? No. (If no, request that the patient contact the pharmacy for the refill.) (If yes, when and what did the pharmacy advise?)  Preferred Pharmacy (with phone number or street name): [facility] # [address], [place].  [place] [place] [address]  Phone:  [phone] Fax:  [phone]  DEA #:  --  Agent: Please be advised that RX refills may take up to 3 business days. We ask that you follow-up with your pharmacy.

## 2020-07-01 NOTE — Telephone Encounter (Signed)
Patient states that insurance states the PA prior auth for medication is not complete : fentaNYL ([place]) 75 MCG/HR [[id number]]   Patient is aware that insurance has a prior authorization on file for this medication  but they are requiring more information for medication refill. Patient would like someone to reach out to her insurance company.

## 2020-07-01 NOTE — Telephone Encounter (Signed)
[facility]

## 2020-07-23 ENCOUNTER — Telehealth: Payer: Self-pay | Admitting: Family Medicine

## 2020-07-27 ENCOUNTER — Other Ambulatory Visit: Payer: Self-pay | Admitting: Family Medicine

## 2020-07-27 ENCOUNTER — Telehealth: Payer: Self-pay | Admitting: Family Medicine

## 2020-07-27 DIAGNOSIS — R52 Pain, unspecified: Secondary | ICD-10-CM

## 2020-07-27 MED ORDER — OXYCODONE-ACETAMINOPHEN 10-325 MG PO TABS
2.0000 | ORAL_TABLET | ORAL | 0 refills | Status: DC | PRN
Start: 2020-07-27 — End: 2020-08-27

## 2020-07-27 NOTE — Telephone Encounter (Signed)
Requesting:perocet 10-325 mg Contract:unknown UDS:10/15/19 Last Visit:01/30/20 Next Visit:08/17/20 Last Refill:07/01/20  Please Advise

## 2020-07-27 NOTE — Telephone Encounter (Signed)
done

## 2020-07-27 NOTE — Telephone Encounter (Signed)
Medication: oxyCODONE-acetaminophen (PERCOCET) 10-325 MG tablet [[id number]]    Has the patient contacted their pharmacy? No. (If no, request that the patient contact the pharmacy for the refill.) (If yes, when and what did the pharmacy advise?)  Preferred Pharmacy (with phone number or street name): [facility] # [address], [place].  [place] [place] [address] [place] [address]  Phone:  [phone] Fax:  [phone]  DEA #:  --  Agent: Please be advised that RX refills may take up to 3 business days. We ask that you follow-up with your pharmacy.

## 2020-07-30 ENCOUNTER — Telehealth: Payer: Self-pay | Admitting: Family Medicine

## 2020-07-30 NOTE — Telephone Encounter (Signed)
Medication: fentaNYL ([place]) 75 MCG/HR [[id number]]   Has the patient contacted their pharmacy? No. (If no, request that the patient contact the pharmacy for the refill.) (If yes, when and what did the pharmacy advise?)  Preferred Pharmacy (with phone number or street name):  [facility] #[id number] [address], [place] - [address] [address] [place] [address]  Phone:  [phone] Fax:  [phone]  DEA #:  [id number]  DAW Reason: --     Agent: Please be advised that RX refills may take up to 3 business days. We ask that you follow-up with your pharmacy.

## 2020-08-02 ENCOUNTER — Other Ambulatory Visit: Payer: Self-pay | Admitting: Family Medicine

## 2020-08-02 DIAGNOSIS — M549 Dorsalgia, unspecified: Secondary | ICD-10-CM

## 2020-08-02 MED ORDER — FENTANYL 75 MCG/HR TD PT72: 1.0000 | MEDICATED_PATCH | TRANSDERMAL | 0 refills | Status: DC

## 2020-08-02 NOTE — Telephone Encounter (Signed)
Requesting:Fentanyl 39mcg Contract:unknown UDS:10/15/19 Last Visit:01/30/20 Next Visit:08/17/20 Last Refill:06/28/20  Please Advise

## 2020-08-02 NOTE — Telephone Encounter (Signed)
done

## 2020-08-04 ENCOUNTER — Other Ambulatory Visit: Payer: Self-pay | Admitting: Family Medicine

## 2020-08-17 ENCOUNTER — Encounter: Payer: BLUE CROSS/BLUE SHIELD | Admitting: Family Medicine

## 2020-08-19 ENCOUNTER — Other Ambulatory Visit: Payer: Self-pay | Admitting: Family Medicine

## 2020-08-19 DIAGNOSIS — I10 Essential (primary) hypertension: Secondary | ICD-10-CM

## 2020-08-27 ENCOUNTER — Telehealth: Payer: Self-pay | Admitting: Family Medicine

## 2020-08-27 ENCOUNTER — Other Ambulatory Visit: Payer: Self-pay | Admitting: Family Medicine

## 2020-08-27 DIAGNOSIS — R52 Pain, unspecified: Secondary | ICD-10-CM

## 2020-08-27 MED ORDER — OXYCODONE-ACETAMINOPHEN 10-325 MG PO TABS: 2.0000 | ORAL_TABLET | ORAL | 0 refills | Status: DC | PRN

## 2020-08-27 NOTE — Telephone Encounter (Signed)
done

## 2020-08-27 NOTE — Telephone Encounter (Signed)
Requesting: [facility]one [facility] Contract:   UDS: 10/15/19 Last Visit: 01/30/20 Next Visit: 09/02/20 Last Refill: 07/27/20   Please Advise

## 2020-08-27 NOTE — Telephone Encounter (Signed)
Medication:  oxyCODONE-acetaminophen (PERCOCET) 10-325 MG tablet   Has the patient contacted their pharmacy? No. (If no, request that the patient contact the pharmacy for the refill.) (If yes, when and what did the pharmacy advise?)  Preferred Pharmacy (with phone number or street name):  [facility] # [address], [place].  [place] [place] [address]  Phone:  [phone] Fax:  [phone]  DEA #:  --  DAW Reason: --     Agent: Please be advised that RX refills may take up to 3 business days. We ask that you follow-up with your pharmacy.

## 2020-08-31 ENCOUNTER — Other Ambulatory Visit: Payer: Self-pay

## 2020-08-31 ENCOUNTER — Telehealth (INDEPENDENT_AMBULATORY_CARE_PROVIDER_SITE_OTHER): Payer: BLUE CROSS/BLUE SHIELD | Admitting: Family Medicine

## 2020-08-31 DIAGNOSIS — R739 Hyperglycemia, unspecified: Secondary | ICD-10-CM

## 2020-08-31 DIAGNOSIS — M25552 Pain in left hip: Secondary | ICD-10-CM | POA: Diagnosis not present

## 2020-08-31 DIAGNOSIS — M8580 Other specified disorders of bone density and structure, unspecified site: Secondary | ICD-10-CM | POA: Diagnosis not present

## 2020-08-31 DIAGNOSIS — M05741 Rheumatoid arthritis with rheumatoid factor of right hand without organ or systems involvement: Secondary | ICD-10-CM

## 2020-08-31 DIAGNOSIS — R52 Pain, unspecified: Secondary | ICD-10-CM

## 2020-08-31 DIAGNOSIS — M05742 Rheumatoid arthritis with rheumatoid factor of left hand without organ or systems involvement: Secondary | ICD-10-CM

## 2020-08-31 NOTE — Assessment & Plan Note (Addendum)
Encouraged to get adequate exercise, calcium and vitamin d intake. She is going to discuss repeat dexa with specialty and if she decides to have the dexa with [place] she will let us know and we will order. She does use steroids intermittently for pain

## 2020-08-31 NOTE — Assessment & Plan Note (Signed)
hgba1c acceptable, minimize simple carbs. Increase exercise as tolerated.  

## 2020-08-31 NOTE — Assessment & Plan Note (Signed)
Monitor and report any concerns, no changes to meds. Encouraged heart healthy diet such as the DASH diet and exercise as tolerated.  ?

## 2020-09-01 DIAGNOSIS — M25552 Pain in left hip: Secondary | ICD-10-CM | POA: Insufficient documentation

## 2020-09-01 NOTE — Progress Notes (Signed)
MyChart Video Visit    Virtual Visit via Video Note   This visit type was conducted due to national recommendations for restrictions regarding the COVID-19 Pandemic (e.g. social distancing) in an effort to limit this patient's exposure and mitigate transmission in our community. This patient is at least at moderate risk for complications without adequate follow up. This format is felt to be most appropriate for this patient at this time. Physical exam was limited by quality of the video and audio technology used for the visit. Nena Alexander, CMA was able to get the patient set up on a video visit.  Patient location: home Patient and provider in visit Provider location: Office  I discussed the limitations of evaluation and management by telemedicine and the availability of in person appointments. The patient expressed understanding and agreed to proceed.  Visit Date: 08/31/2020  Today's healthcare provider: Penni Homans, MD     Subjective:    Patient ID: Jennifer Kelly, female    DOB: [birth year], 60 y.o.   MRN: [id number]  No chief complaint on file.   HPI Patient is in today for follow up on chronic medical concerns. She is working full time again from home and she manages to get done what she needs to with her current pain meds. She is noting an increase in left hip pain but denies any recent fall or trauma. She is following with rheumatology and tolerating Actimra. No recent febrile illness or hospitalizations. Denies CP/palp/SOB/HA/congestion/fevers/GI or GU c/o. Taking meds as prescribed  Past Medical History:  Diagnosis Date  . Acute bronchitis 01/05/2014  . Arthritis   . Chicken pox as a child  . Dermatitis 09/09/2014  . Ganglion cyst of left foot 09/09/2014  . H/O mumps   . H/O tobacco use, presenting hazards to health 10/08/2009   Qualifier: Diagnosis of  By: Linda Hedges MD, Heinz Knuckles  Last cigarette 02/16/2016   . History of chicken pox   . Hyperglycemia 11/30/2015  .  Hypertension   . Morbid obesity ([place]) 08/31/2009   Qualifier: Diagnosis of  By: Linda Hedges MD, Heinz Knuckles  BMI 34.4(Sept '13) BMI 34 (March '15)   . Mumps as a child  . Osteopenia 01/02/2017  . Osteoporosis 11/20/2016  . Psoriatic arthritis ([place])   . Rheumatoid arthritis ([place])   . Shingles 2014    Past Surgical History:  Procedure Laterality Date  . BACK SURGERY  2003  . NECK SURGERY  02/17/2016   C4, C5 & C6  . rotater cuff  20 yrs ago   left shoulder  . TOOTH EXTRACTION  05/2012  . WISDOM TOOTH EXTRACTION  60 yrs old    Family History  Problem Relation Age of Onset  . Parkinson's disease Mother   . Heart disease Mother   . Hypertension Mother   . Diabetes Father        type 2  . Hypertension Father   . Stroke Father   . Vascular Disease Brother   . Hypertension Brother   . Diabetes Brother   . Heart attack Paternal Uncle     Social History   Socioeconomic History  . Marital status: Divorced    Spouse name: Not on file  . Number of children: 0  . Years of education: Not on file  . Highest education level: Not on file  Occupational History  . Occupation: help Therapist, sports  Tobacco Use  . Smoking status: Former Smoker    Start date: 11/30/2015  . Smokeless  tobacco: Never Used  Substance and Sexual Activity  . Alcohol use: Yes    Comment: occ wine  . Drug use: No  . Sexual activity: Never    Comment: no dietary restrictions. lives alone  Other Topics Concern  . Not on file  Social History Narrative  . Not on file   Social Determinants of Health   Financial Resource Strain: Not on file  Food Insecurity: Not on file  Transportation Needs: Not on file  Physical Activity: Not on file  Stress: Not on file  Social Connections: Not on file  Intimate Partner Violence: Not on file    Outpatient Medications Prior to Visit  Medication Sig Dispense Refill  . albuterol (VENTOLIN HFA) 108 (90 Base) MCG/ACT inhaler Inhale 2 puffs into the lungs every 6 (six) hours  as needed for wheezing or shortness of breath. 18 g 5  . citalopram (CELEXA) 20 MG tablet Take 1 tablet (20 mg total) by mouth daily. 90 tablet 1  . diltiazem (TIAZAC) 240 MG 24 hr capsule Take 1 capsule (240 mg total) by mouth daily. 90 capsule 1  . fentaNYL (DURAGESIC) 75 MCG/HR Place 1 patch onto the skin every 3 (three) days. February 2022 10 patch 0  . gabapentin (NEURONTIN) 600 MG tablet Take 1 tablet (600 mg total) by mouth 3 (three) times daily. 270 tablet 1  . hydrochlorothiazide (MICROZIDE) 12.5 MG capsule TAKE 1 CAPSULE(12.5 MG) BY MOUTH DAILY 90 capsule 1  . losartan (COZAAR) 50 MG tablet TAKE 1 TABLET(50 MG) BY MOUTH DAILY 90 tablet 1  . nystatin cream (MYCOSTATIN) Apply 1 application topically 2 (two) times daily as needed for dry skin. 30 g 0  . oxyCODONE-acetaminophen (PERCOCET) 10-325 MG tablet Take 2 tablets by mouth every 4 (four) hours as needed for pain. June 2021 180 tablet 0  . venlafaxine XR (EFFEXOR-XR) 75 MG 24 hr capsule Take 1 capsule po daily 90 capsule 1   No facility-administered medications prior to visit.    Allergies  Allergen Reactions  . Simponi [Golimumab] Rash    States that it was systemic    Review of Systems  Constitutional: Negative for fever and malaise/fatigue.  HENT: Negative for congestion.   Eyes: Negative for blurred vision.  Respiratory: Negative for shortness of breath.   Cardiovascular: Negative for chest pain, palpitations and leg swelling.  Gastrointestinal: Negative for abdominal pain, blood in stool and nausea.  Genitourinary: Negative for dysuria and frequency.  Musculoskeletal: Positive for back pain, joint pain, myalgias and neck pain. Negative for falls.  Skin: Negative for rash.  Neurological: Negative for dizziness, loss of consciousness and headaches.  Endo/Heme/Allergies: Negative for environmental allergies.  Psychiatric/Behavioral: Negative for depression. The patient is not nervous/anxious.        Objective:     Physical Exam Constitutional:      Appearance: Normal appearance. She is obese. She is not ill-appearing.  HENT:     Head: Normocephalic and atraumatic.     Right Ear: External ear normal.     Left Ear: External ear normal.     Nose: Nose normal.  Eyes:     General:        Right eye: No discharge.        Left eye: No discharge.  Pulmonary:     Effort: Pulmonary effort is normal.  Neurological:     Mental Status: She is alert and oriented to person, place, and time.  Psychiatric:        Behavior:  Behavior normal.     There were no vitals taken for this visit. Wt Readings from Last 3 Encounters:  02/28/19 239 lb (108.4 kg)  02/11/18 238 lb (108 kg)  08/09/17 245 lb 12.8 oz (111.5 kg)    Diabetic Foot Exam - Simple   No data filed    Lab Results  Component Value Date   WBC 9.5 10/15/2019   HGB 15.1 10/15/2019   HCT 45 10/15/2019   PLT 208 10/15/2019   GLUCOSE 85 02/11/2018   CHOL 161 02/11/2018   TRIG 105.0 02/11/2018   HDL 68.90 02/11/2018   LDLCALC 71 02/11/2018   ALT 34 10/15/2019   AST 23 10/15/2019   NA 138 10/15/2019   K 3.8 10/15/2019   CL 101 10/15/2019   CREATININE 0.63 02/11/2018   BUN 20 02/11/2018   CO2 28 (A) 10/15/2019   TSH 2.06 10/15/2019   HGBA1C 5.4 10/15/2019   HGBA1C 5.4 10/15/2019    Lab Results  Component Value Date   TSH 2.06 10/15/2019   Lab Results  Component Value Date   WBC 9.5 10/15/2019   HGB 15.1 10/15/2019   HCT 45 10/15/2019   MCV 90.6 02/11/2018   PLT 208 10/15/2019   Lab Results  Component Value Date   NA 138 10/15/2019   K 3.8 10/15/2019   CO2 28 (A) 10/15/2019   GLUCOSE 85 02/11/2018   BUN 20 02/11/2018   CREATININE 0.63 02/11/2018   BILITOT 0.6 02/11/2018   ALKPHOS 86 10/15/2019   AST 23 10/15/2019   ALT 34 10/15/2019   PROT 7.6 02/11/2018   ALBUMIN 4.0 10/15/2019   CALCIUM 9.1 10/15/2019   ANIONGAP 8 11/26/2014   GFR 103.01 02/11/2018   Lab Results  Component Value Date   CHOL 161 02/11/2018    Lab Results  Component Value Date   HDL 68.90 02/11/2018   Lab Results  Component Value Date   LDLCALC 71 02/11/2018   Lab Results  Component Value Date   TRIG 105.0 02/11/2018   Lab Results  Component Value Date   CHOLHDL 2 02/11/2018   Lab Results  Component Value Date   HGBA1C 5.4 10/15/2019   HGBA1C 5.4 10/15/2019       Assessment & Plan:   Problem List Items Addressed This Visit    Rheumatoid arthritis ([place])    Continues to follow with Rheumatology and is tolerating Actimra      Pain    Struggles with daily pain but she is able to maintain her full time job and complete ADLs with current medication regimen, no changes.       Hyperglycemia    hgba1c acceptable, minimize simple carbs. Increase exercise as tolerated.       Osteopenia    Encouraged to get adequate exercise, calcium and vitamin d intake. She is going to discuss repeat dexa with specialty and if she decides to have the dexa with [place] she will let us know and we will order. She does use steroids intermittently for pain      Left hip pain    Is encouraged to stay as active as able and to contact orthopaedics if pain persists. Encouraged moist heat and gentle stretching as tolerated. May try NSAIDs and prescription meds as directed and report if symptoms worsen or seek immediate care         I am having Jennifer Kelly "Di" maintain her citalopram, venlafaxine XR, diltiazem, albuterol, nystatin cream, fentaNYL, gabapentin, hydrochlorothiazide, losartan, and oxyCODONE-acetaminophen.  No orders of the defined types were placed in this encounter.   I discussed the assessment and treatment plan with the patient. The patient was provided an opportunity to ask questions and all were answered. The patient agreed with the plan and demonstrated an understanding of the instructions.   The patient was advised to call back or seek an in-person evaluation if the symptoms worsen or if the condition fails to  improve as anticipated.  I provided 23 minutes of face-to-face time during this encounter.   Penni Homans, MD [facility] [phone] (phone) [phone] (fax)  [place]

## 2020-09-01 NOTE — Assessment & Plan Note (Signed)
Is encouraged to stay as active as able and to contact orthopaedics if pain persists. Encouraged moist heat and gentle stretching as tolerated. May try NSAIDs and prescription meds as directed and report if symptoms worsen or seek immediate care

## 2020-09-01 NOTE — Assessment & Plan Note (Signed)
Struggles with daily pain but she is able to maintain her full time job and complete ADLs with current medication regimen, no changes.

## 2020-09-01 NOTE — Assessment & Plan Note (Signed)
Continues to follow with Rheumatology and is tolerating Actimra

## 2020-09-02 ENCOUNTER — Telehealth: Payer: BLUE CROSS/BLUE SHIELD | Admitting: Family Medicine

## 2020-09-08 ENCOUNTER — Telehealth: Payer: Self-pay | Admitting: Family Medicine

## 2020-09-08 NOTE — Telephone Encounter (Signed)
Medication: fentaNYL ([place]) 75 MCG/HR [[id number]]     Has the patient contacted their pharmacy? no (If no, request that the patient contact the pharmacy for the refill.) (If yes, when and what did the pharmacy advise?)    Preferred Pharmacy (with phone number or street name):  [id number], [address] - [place] AT [address] [place] [address]  Phone:  [phone] Fax:  [phone]    Agent: Please be advised that RX refills may take up to 3 business days. We ask that you follow-up with your pharmacy.

## 2020-09-09 NOTE — Telephone Encounter (Signed)
Requesting: fentanyl(duragesic) 75 mcg/hr Contract:10/15/19 UDS:10/15/19 Last Visit:08/31/20 Next Visit:03/03/21 Last Refill:08/02/20  Please Advise

## 2020-09-10 ENCOUNTER — Encounter: Payer: BLUE CROSS/BLUE SHIELD | Admitting: Medical

## 2020-09-10 NOTE — Telephone Encounter (Signed)
Patient is call about medication refill. Patient would like medication sent in to [facility].

## 2020-09-11 ENCOUNTER — Encounter: Payer: Self-pay | Admitting: Family Medicine

## 2020-09-12 ENCOUNTER — Other Ambulatory Visit: Payer: Self-pay | Admitting: Family Medicine

## 2020-09-12 DIAGNOSIS — M549 Dorsalgia, unspecified: Secondary | ICD-10-CM

## 2020-09-12 MED ORDER — FENTANYL 75 MCG/HR TD PT72
1.0000 | MEDICATED_PATCH | TRANSDERMAL | 0 refills | Status: DC
Start: 2020-09-12 — End: 2020-10-11

## 2020-09-13 ENCOUNTER — Other Ambulatory Visit: Payer: Self-pay | Admitting: Family Medicine

## 2020-09-22 ENCOUNTER — Other Ambulatory Visit: Payer: Self-pay | Admitting: Family Medicine

## 2020-09-22 ENCOUNTER — Telehealth: Payer: Self-pay | Admitting: Family Medicine

## 2020-09-22 DIAGNOSIS — R52 Pain, unspecified: Secondary | ICD-10-CM

## 2020-09-22 MED ORDER — OXYCODONE-ACETAMINOPHEN 10-325 MG PO TABS: 2.0000 | ORAL_TABLET | ORAL | 0 refills | Status: DC | PRN

## 2020-09-22 NOTE — Telephone Encounter (Signed)
Medication:oxyCODONE-acetaminophen (PERCOCET) 10-325 MG tablet [[id number]]       Has the patient contacted their pharmacy? no (If no, request that the patient contact the pharmacy for the refill.) (If yes, when and what did the pharmacy advise?)    Preferred Pharmacy (with phone number or street name):  [facility] [id number] - [place], [place]. Phone:  [phone]  Fax:  [phone]         Agent: Please be advised that RX refills may take up to 3 business days. We ask that you follow-up with your pharmacy.

## 2020-09-22 NOTE — Telephone Encounter (Signed)
done

## 2020-09-22 NOTE — Telephone Encounter (Signed)
Requesting: oxycodone Contract: UDS: 10/15/19 Last Visit: 08/31/20 Next Visit: 03/03/21 Last Refill: 08/27/20  Will update uds and contract at next visit  Please Advise

## 2020-09-23 ENCOUNTER — Telehealth: Payer: Self-pay | Admitting: Family Medicine

## 2020-09-23 ENCOUNTER — Other Ambulatory Visit: Payer: Self-pay | Admitting: Family Medicine

## 2020-09-23 DIAGNOSIS — R52 Pain, unspecified: Secondary | ICD-10-CM

## 2020-09-23 MED ORDER — OXYCODONE-ACETAMINOPHEN 10-325 MG PO TABS
2.0000 | ORAL_TABLET | ORAL | 0 refills | Status: DC | PRN
Start: 2020-09-23 — End: 2020-10-21

## 2020-09-23 NOTE — Telephone Encounter (Signed)
Medication sent.

## 2020-09-23 NOTE — Telephone Encounter (Signed)
Pt need medication below resent in to pharmacy below

## 2020-09-23 NOTE — Telephone Encounter (Signed)
Make sure with her every time it is changed to the appropriate pharmacy she is asking for before I sign so I send it to the correct place. She uses two different pharmacies

## 2020-09-23 NOTE — Telephone Encounter (Signed)
Medication sent to the wrong pharmacy a few days ago, [facility]     Medication:  oxyCODONE-acetaminophen (PERCOCET) 10-325 MG tablet   Has the patient contacted their pharmacy? No. (If no, request that the patient contact the pharmacy for the refill.) (If yes, when and what did the pharmacy advise?)  Preferred Pharmacy (with phone number or street name):  [facility] [address] - [place], [place]. Phone:  [phone]  Fax:  [phone]       Agent: Please be advised that RX refills may take up to 3 business days. We ask that you follow-up with your pharmacy.

## 2020-10-11 ENCOUNTER — Telehealth: Payer: Self-pay | Admitting: Family Medicine

## 2020-10-11 ENCOUNTER — Other Ambulatory Visit: Payer: Self-pay | Admitting: Family Medicine

## 2020-10-11 DIAGNOSIS — M549 Dorsalgia, unspecified: Secondary | ICD-10-CM

## 2020-10-11 MED ORDER — FENTANYL 75 MCG/HR TD PT72: 1.0000 | MEDICATED_PATCH | TRANSDERMAL | 0 refills | Status: DC

## 2020-10-11 NOTE — Telephone Encounter (Signed)
done

## 2020-10-11 NOTE — Telephone Encounter (Signed)
Medication: fentaNYL ([place]) 75 MCG/HR [[id number]]       Has the patient contacted their pharmacy? no (If no, request that the patient contact the pharmacy for the refill.) (If yes, when and what did the pharmacy advise?)     Preferred Pharmacy (with phone number or street name): [facility] #[id number] [address], [place] - [place] AT [address] [place] [address]  Phone:  [phone] Fax:  [phone]     Agent: Please be advised that RX refills may take up to 3 business days. We ask that you follow-up with your pharmacy.

## 2020-10-11 NOTE — Telephone Encounter (Signed)
Requesting:fentanyl 75 mcg/hr Contract:10/15/19 UDS:10/15/19 Last Visit:08/31/20 Next Visit:03/03/21 Last Refill:09/12/20  Please Advise

## 2020-10-15 ENCOUNTER — Telehealth: Payer: Self-pay | Admitting: *Deleted

## 2020-10-15 ENCOUNTER — Telehealth: Payer: Self-pay | Admitting: Family Medicine

## 2020-10-15 NOTE — Telephone Encounter (Signed)
Called for prior auth on fentanyl patches and received approval 10/15/20-04/13/21  Pharmacy notified.

## 2020-10-15 NOTE — Telephone Encounter (Signed)
Patient states insurance need a prior authorization along with dosage verification. Patient states she is completely out.     fentaNYL ([place]) 75 MCG/HR [[id number]]

## 2020-10-15 NOTE — Telephone Encounter (Signed)
Patient notified

## 2020-10-21 ENCOUNTER — Telehealth: Payer: Self-pay | Admitting: Family Medicine

## 2020-10-21 ENCOUNTER — Other Ambulatory Visit: Payer: Self-pay | Admitting: Family Medicine

## 2020-10-21 DIAGNOSIS — R52 Pain, unspecified: Secondary | ICD-10-CM

## 2020-10-21 MED ORDER — OXYCODONE-ACETAMINOPHEN 10-325 MG PO TABS: 2.0000 | ORAL_TABLET | ORAL | 0 refills | Status: DC | PRN

## 2020-10-21 NOTE — Telephone Encounter (Signed)
done

## 2020-10-21 NOTE — Telephone Encounter (Signed)
Requesting:percocet 10-325mg  Contract:10/15/19 UDS:10/15/19 Last Visit:08/31/20 Next Visit:03/03/21 Last Refill:09/23/20  Please Advise

## 2020-10-21 NOTE — Telephone Encounter (Signed)
Medication: oxyCODONE-acetaminophen (PERCOCET) 10-325 MG tablet [[id number]]       Has the patient contacted their pharmacy? no (If no, request that the patient contact the pharmacy for the refill.) (If yes, when and what did the pharmacy advise?) call doc    Preferred Pharmacy (with phone number or street name): [facility] # [id number], [place].  [place] [place] [address]  Phone:  [phone] Fax:  [phone]      Agent: Please be advised that RX refills may take up to 3 business days. We ask that you follow-up with your pharmacy.

## 2020-11-06 ENCOUNTER — Other Ambulatory Visit: Payer: Self-pay | Admitting: Family Medicine

## 2020-11-11 ENCOUNTER — Other Ambulatory Visit: Payer: Self-pay

## 2020-11-11 DIAGNOSIS — M549 Dorsalgia, unspecified: Secondary | ICD-10-CM

## 2020-11-11 MED ORDER — FENTANYL 75 MCG/HR TD PT72: 1.0000 | MEDICATED_PATCH | TRANSDERMAL | 0 refills | Status: DC

## 2020-11-11 NOTE — Telephone Encounter (Signed)
Requesting: fentanyl Contract:  UDS:10/15/19 Last Visit: 08/31/20 Next Visit: 03/03/21 Last Refill: 10/11/20  Please Advise

## 2020-11-11 NOTE — Telephone Encounter (Signed)
Patient called requesting refill on patch. She would like sent to [facility]. I have pended refill request.

## 2020-11-17 ENCOUNTER — Other Ambulatory Visit: Payer: Self-pay | Admitting: Family Medicine

## 2020-11-17 ENCOUNTER — Telehealth: Payer: Self-pay

## 2020-11-17 DIAGNOSIS — R52 Pain, unspecified: Secondary | ICD-10-CM

## 2020-11-17 MED ORDER — OXYCODONE-ACETAMINOPHEN 10-325 MG PO TABS: 2.0000 | ORAL_TABLET | ORAL | 0 refills | Status: DC | PRN

## 2020-11-17 NOTE — Telephone Encounter (Signed)
Requesting:perocet 10-325 mg Contract:07/06/17 UDS:10/18/19 Last Visit:09/02/20 Next Visit:01/10/21 Last Refill:10/21/20  Please Advise

## 2020-11-26 ENCOUNTER — Other Ambulatory Visit: Payer: Self-pay | Admitting: Family Medicine

## 2020-12-03 ENCOUNTER — Other Ambulatory Visit: Payer: Self-pay | Admitting: Family Medicine

## 2020-12-13 ENCOUNTER — Other Ambulatory Visit: Payer: Self-pay | Admitting: Family Medicine

## 2020-12-13 ENCOUNTER — Telehealth: Payer: Self-pay | Admitting: Family Medicine

## 2020-12-13 DIAGNOSIS — M549 Dorsalgia, unspecified: Secondary | ICD-10-CM

## 2020-12-13 MED ORDER — FENTANYL 75 MCG/HR TD PT72: 1.0000 | MEDICATED_PATCH | TRANSDERMAL | 0 refills | Status: DC

## 2020-12-13 NOTE — Telephone Encounter (Signed)
Requesting: fentanyl Contract: 10/05/19 UDS:10/05/19 Last Visit: 08/31/20 Next Visit: 01/10/21 Last Refill:11/11/20  Please Advise

## 2020-12-13 NOTE — Telephone Encounter (Signed)
Medication: fentaNYL ([place]) 75 MCG/HR [  Has the patient contacted their pharmacy? Yes.   (If no, request that the patient contact the pharmacy for the refill.) (If yes, when and what did the pharmacy advise?)  Preferred Pharmacy (with phone number or street name):   [facility] CS:[id number], [place] - [place] AT [address] [place] [address]  Phone:  [phone]  Fax:  [phone]   Agent: Please be advised that RX refills may take up to 3 business days. We ask that you follow-up with your pharmacy.

## 2020-12-14 ENCOUNTER — Other Ambulatory Visit: Payer: Self-pay | Admitting: Family Medicine

## 2020-12-14 ENCOUNTER — Telehealth: Payer: Self-pay | Admitting: Family Medicine

## 2020-12-14 DIAGNOSIS — R52 Pain, unspecified: Secondary | ICD-10-CM

## 2020-12-14 MED ORDER — OXYCODONE-ACETAMINOPHEN 10-325 MG PO TABS: 2.0000 | ORAL_TABLET | ORAL | 0 refills | Status: DC | PRN

## 2020-12-14 NOTE — Telephone Encounter (Signed)
Requesting:perocet 10-325 mg tablet Contract:unknown UDS:10/15/19 Last Visit:08/31/20 Next Visit:01/10/21 Last Refill:11/17/20  Please Advise

## 2020-12-14 NOTE — Telephone Encounter (Signed)
Pt states she has to call in her rx for ox[facility]codone for [facility] in [place], states this is the only rx that goes there and Dr. B is aware.

## 2020-12-15 NOTE — Telephone Encounter (Signed)
Left detailed message on machine that rx was sent in. 

## 2021-01-10 ENCOUNTER — Telehealth (INDEPENDENT_AMBULATORY_CARE_PROVIDER_SITE_OTHER): Payer: BLUE CROSS/BLUE SHIELD | Admitting: Family Medicine

## 2021-01-10 ENCOUNTER — Other Ambulatory Visit: Payer: Self-pay

## 2021-01-10 DIAGNOSIS — M05741 Rheumatoid arthritis with rheumatoid factor of right hand without organ or systems involvement: Secondary | ICD-10-CM | POA: Diagnosis not present

## 2021-01-10 DIAGNOSIS — R739 Hyperglycemia, unspecified: Secondary | ICD-10-CM

## 2021-01-10 DIAGNOSIS — R52 Pain, unspecified: Secondary | ICD-10-CM

## 2021-01-10 DIAGNOSIS — I10 Essential (primary) hypertension: Secondary | ICD-10-CM

## 2021-01-10 DIAGNOSIS — G894 Chronic pain syndrome: Secondary | ICD-10-CM

## 2021-01-10 DIAGNOSIS — M549 Dorsalgia, unspecified: Secondary | ICD-10-CM

## 2021-01-10 DIAGNOSIS — M05742 Rheumatoid arthritis with rheumatoid factor of left hand without organ or systems involvement: Secondary | ICD-10-CM

## 2021-01-10 DIAGNOSIS — M545 Low back pain, unspecified: Secondary | ICD-10-CM

## 2021-01-10 DIAGNOSIS — F418 Other specified anxiety disorders: Secondary | ICD-10-CM

## 2021-01-10 DIAGNOSIS — L309 Dermatitis, unspecified: Secondary | ICD-10-CM

## 2021-01-10 MED ORDER — OXYCODONE-ACETAMINOPHEN 10-325 MG PO TABS: 2.0000 | ORAL_TABLET | ORAL | 0 refills | Status: DC | PRN

## 2021-01-10 MED ORDER — FENTANYL 75 MCG/HR TD PT72: 1.0000 | MEDICATED_PATCH | TRANSDERMAL | 0 refills | Status: DC

## 2021-01-10 MED ORDER — CITALOPRAM HYDROBROMIDE 20 MG PO TABS: ORAL_TABLET | ORAL | 1 refills | Status: DC

## 2021-01-10 MED ORDER — HYDROCHLOROTHIAZIDE 12.5 MG PO CAPS: ORAL_CAPSULE | ORAL | 1 refills | Status: DC

## 2021-01-10 MED ORDER — LOSARTAN POTASSIUM 50 MG PO TABS: ORAL_TABLET | ORAL | 1 refills | Status: DC

## 2021-01-10 NOTE — Assessment & Plan Note (Addendum)
Follows with Dr Boris Lown of rheumatology and is stable on [facility]

## 2021-01-10 NOTE — Progress Notes (Signed)
MyChart Video Visit    Virtual Visit via Video Note   This visit type was conducted due to national recommendations for restrictions regarding the COVID-19 Pandemic (e.g. social distancing) in an effort to limit this patient's exposure and mitigate transmission in our community. This patient is at least at moderate risk for complications without adequate follow up. This format is felt to be most appropriate for this patient at this time. Physical exam was limited by quality of the video and audio technology used for the visit. S Chism, CMA was able to get the patient set up on a video visit.  Patient location: Home Patient and provider in visit Provider location: Office  I discussed the limitations of evaluation and management by telemedicine and the availability of in person appointments. The patient expressed understanding and agreed to proceed.  Visit Date: 01/10/2021  Today's healthcare provider: Penni Homans, MD      Subjective:    Patient ID: Jennifer Kelly, female    DOB: 01/12/1961, 60 y.o.   MRN: [id number]  Chief Complaint  Patient presents with   Medication Refill   Follow-up    HPI Patient is in today for a video visit.  She mentions she is doing well.  She recently had lab work done.  She was recently sick after a beach visit but tested negative for Covid-19. She did not have a fever but had chest pressure and could not talk. She was staying with her sister who first had these symptoms and her sister was diagnosed with pneumonia and COPD. She managed these symptoms with mucinex, cough drops and orange juice and reports she is almost back to baseline.  She manages her rheumatoid arthritis with Actemra shots and is doing well on it. She recently started using a cream recommended by her friend and says it is causing her great relief.  She mentions she has been staying at home for the past 2 years since Covid-19 started and is not interested in going out or  socializing with friends. She does not think it is depression but would like to start a program that will make her start moving around. [facility] to [facility] before Covid-19 started and says she found it very effective and was able to stay active.  She is requesting a refill on 12.5 mg Microzide PO daily, 50 mg losartan PO daily, 75 mcg/hr fentanyl and 10-125 mg oxycodone 2x prn.   Past Medical History:  Diagnosis Date   Acute bronchitis 01/05/2014   Arthritis    Chicken pox as a child   Dermatitis 09/09/2014   Ganglion cyst of left foot 09/09/2014   H/O mumps    H/O tobacco use, presenting hazards to health 10/08/2009   Qualifier: Diagnosis of  By: Linda Hedges MD, Heinz Knuckles  Last cigarette 02/16/2016    History of chicken pox    Hyperglycemia 11/30/2015   Hypertension    Morbid obesity ([place]) 08/31/2009   Qualifier: Diagnosis of  By: Linda Hedges MD, Heinz Knuckles  BMI 34.4(Sept '13) BMI 34 (March '15)    Mumps as a child   Osteopenia 01/02/2017   Osteoporosis 11/20/2016   Psoriatic arthritis ([place])    Rheumatoid arthritis ([place])    Shingles 2014    Past Surgical History:  Procedure Laterality Date   BACK SURGERY  2003   NECK SURGERY  02/17/2016   C4, C5 & C6   rotater cuff  20 yrs ago   left shoulder   TOOTH EXTRACTION  05/2012   WISDOM TOOTH EXTRACTION  60 yrs old    Family History  Problem Relation Age of Onset   Parkinson's disease Mother    Heart disease Mother    Hypertension Mother    Diabetes Father        type 2   Hypertension Father    Stroke Father    Vascular Disease Brother    Hypertension Brother    Diabetes Brother    Heart attack Paternal Uncle     Social History   Socioeconomic History   Marital status: Divorced    Spouse name: Not on file   Number of children: 0   Years of education: Not on file   Highest education level: Not on file  Occupational History   Occupation: help Therapist, sports  Tobacco Use   Smoking status: Former    Types: Cigarettes     Start date: 11/30/2015   Smokeless tobacco: Never  Substance and Sexual Activity   Alcohol use: Yes    Comment: occ wine   Drug use: No   Sexual activity: Never    Comment: no dietary restrictions. lives alone  Other Topics Concern   Not on file  Social History Narrative   Not on file   Social Determinants of Health   Financial Resource Strain: Not on file  Food Insecurity: Not on file  Transportation Needs: Not on file  Physical Activity: Not on file  Stress: Not on file  Social Connections: Not on file  Intimate Partner Violence: Not on file    Outpatient Medications Prior to Visit  Medication Sig Dispense Refill   albuterol (VENTOLIN HFA) 108 (90 Base) MCG/ACT inhaler INHALE 2 PUFFS INTO THE LUNGS EVERY 6 HOURS AS NEEDED FOR WHEEZING OR SHORTNESS OF BREATH 18 g 5   diltiazem (TIAZAC) 240 MG 24 hr capsule TAKE 1 CAPSULE(240 MG) BY MOUTH DAILY 90 capsule 1   gabapentin (NEURONTIN) 600 MG tablet Take 1 tablet (600 mg total) by mouth 3 (three) times daily. 270 tablet 1   nystatin cream (MYCOSTATIN) Apply 1 application topically 2 (two) times daily as needed for dry skin. 30 g 0   venlafaxine XR (EFFEXOR-XR) 75 MG 24 hr capsule TAKE 1 CAPSULE BY MOUTH DAILY 90 capsule 1   citalopram (CELEXA) 20 MG tablet TAKE 1 TABLET(20 MG) BY MOUTH DAILY 90 tablet 1   fentaNYL (DURAGESIC) 75 MCG/HR Place 1 patch onto the skin every 3 (three) days. February 2022 10 patch 0   hydrochlorothiazide (MICROZIDE) 12.5 MG capsule TAKE 1 CAPSULE(12.5 MG) BY MOUTH DAILY 90 capsule 1   losartan (COZAAR) 50 MG tablet TAKE 1 TABLET(50 MG) BY MOUTH DAILY 90 tablet 1   oxyCODONE-acetaminophen (PERCOCET) 10-325 MG tablet Take 2 tablets by mouth every 4 (four) hours as needed for pain. June 2021 180 tablet 0   No facility-administered medications prior to visit.    Allergies  Allergen Reactions   Simponi [Golimumab] Rash    States that it was systemic    ROS     Objective:    Physical Exam  There  were no vitals taken for this visit. Wt Readings from Last 3 Encounters:  02/28/19 239 lb (108.4 kg)  02/11/18 238 lb (108 kg)  08/09/17 245 lb 12.8 oz (111.5 kg)    Diabetic Foot Exam - Simple   No data filed    Lab Results  Component Value Date   WBC 9.5 10/15/2019   HGB 15.1 10/15/2019   HCT 45  10/15/2019   PLT 208 10/15/2019   GLUCOSE 85 02/11/2018   CHOL 161 02/11/2018   TRIG 105.0 02/11/2018   HDL 68.90 02/11/2018   LDLCALC 71 02/11/2018   ALT 34 10/15/2019   AST 23 10/15/2019   NA 138 10/15/2019   K 3.8 10/15/2019   CL 101 10/15/2019   CREATININE 0.63 02/11/2018   BUN 20 02/11/2018   CO2 28 (A) 10/15/2019   TSH 2.06 10/15/2019   HGBA1C 5.4 10/15/2019   HGBA1C 5.4 10/15/2019    Lab Results  Component Value Date   TSH 2.06 10/15/2019   Lab Results  Component Value Date   WBC 9.5 10/15/2019   HGB 15.1 10/15/2019   HCT 45 10/15/2019   MCV 90.6 02/11/2018   PLT 208 10/15/2019   Lab Results  Component Value Date   NA 138 10/15/2019   K 3.8 10/15/2019   CO2 28 (A) 10/15/2019   GLUCOSE 85 02/11/2018   BUN 20 02/11/2018   CREATININE 0.63 02/11/2018   BILITOT 0.6 02/11/2018   ALKPHOS 86 10/15/2019   AST 23 10/15/2019   ALT 34 10/15/2019   PROT 7.6 02/11/2018   ALBUMIN 4.0 10/15/2019   CALCIUM 9.1 10/15/2019   ANIONGAP 8 11/26/2014   GFR 103.01 02/11/2018   Lab Results  Component Value Date   CHOL 161 02/11/2018   Lab Results  Component Value Date   HDL 68.90 02/11/2018   Lab Results  Component Value Date   LDLCALC 71 02/11/2018   Lab Results  Component Value Date   TRIG 105.0 02/11/2018   Lab Results  Component Value Date   CHOLHDL 2 02/11/2018   Lab Results  Component Value Date   HGBA1C 5.4 10/15/2019   HGBA1C 5.4 10/15/2019       Assessment & Plan:   Problem List Items Addressed This Visit     Morbid obesity ([place])    Encouraged DASH or MIND diet, decrease po intake and increase exercise as tolerated. Needs 7-8 hours of  sleep nightly. Avoid trans fats, eat small, frequent meals every 4-5 hours with lean proteins, complex carbs and healthy fats. Minimize simple carbs, high fat foods and processed foods      Depression with anxiety    hgba1c acceptable, minimize simple carbs. Increase exercise as tolerated. Continue current meds      Relevant Medications   citalopram (CELEXA) 20 MG tablet   Rheumatoid arthritis ([place])    Follows with Dr Boris Lown of rheumatology and is stable on Ektemra      Relevant Medications   fentaNYL (DURAGESIC) 75 MCG/HR   oxyCODONE-acetaminophen (PERCOCET) 10-325 MG tablet   LOW BACK PAIN    Encouraged moist heat and gentle stretching as tolerated. May try NSAIDs and prescription meds as directed and report if symptoms worsen or seek immediate care. Refills given on Oxycodone and Fentanyl and stable on current dose      Relevant Medications   fentaNYL (DURAGESIC) 75 MCG/HR   oxyCODONE-acetaminophen (PERCOCET) 10-325 MG tablet   Hypertension    Monitor and report any concerns, no changes to meds. Encouraged heart healthy diet such as the DASH diet and exercise as tolerated.       Relevant Medications   hydrochlorothiazide (MICROZIDE) 12.5 MG capsule   losartan (COZAAR) 50 MG tablet   Chronic pain syndrome    Encouraged moist heat and gentle stretching as tolerated. May try NSAIDs and prescription meds as directed and report if symptoms worsen or seek immediate care. [facility]  [facility] and [facility] and increased exercise. She will try and start and we can write a prescription if she needs it.      Relevant Medications   citalopram (CELEXA) 20 MG tablet   fentaNYL (DURAGESIC) 75 MCG/HR   oxyCODONE-acetaminophen (PERCOCET) 10-325 MG tablet   Pain   Relevant Medications   oxyCODONE-acetaminophen (PERCOCET) 10-325 MG tablet   Dermatitis    Continues to struggle with candidal rash under both breasts. Partially responds to Diflucan. She has tried a  compounded cream her friend gave her with good results. She will send [place] the name so we can consider a prescription      Hyperglycemia    hgba1c acceptable, minimize simple carbs. Increase exercise as tolerated.       Other Visit Diagnoses     Essential hypertension       Relevant Medications   hydrochlorothiazide (MICROZIDE) 12.5 MG capsule   losartan (COZAAR) 50 MG tablet   Back pain, unspecified back location, unspecified back pain laterality, unspecified chronicity       Relevant Medications   fentaNYL (DURAGESIC) 75 MCG/HR   oxyCODONE-acetaminophen (PERCOCET) 10-325 MG tablet       '@ENCMEDP'$ @  Meds ordered this encounter  Medications   hydrochlorothiazide (MICROZIDE) 12.5 MG capsule    Sig: TAKE 1 CAPSULE(12.5 MG) BY MOUTH DAILY    Dispense:  90 capsule    Refill:  1   losartan (COZAAR) 50 MG tablet    Sig: TAKE 1 TABLET(50 MG) BY MOUTH DAILY    Dispense:  90 tablet    Refill:  1   citalopram (CELEXA) 20 MG tablet    Sig: TAKE 1 TABLET(20 MG) BY MOUTH DAILY    Dispense:  90 tablet    Refill:  1   fentaNYL (DURAGESIC) 75 MCG/HR    Sig: Place 1 patch onto the skin every 3 (three) days. February 2022    Dispense:  10 patch    Refill:  0   oxyCODONE-acetaminophen (PERCOCET) 10-325 MG tablet    Sig: Take 2 tablets by mouth every 4 (four) hours as needed for pain. June 2021    Dispense:  180 tablet    Refill:  0    I discussed the assessment and treatment plan with the patient. The patient was provided an opportunity to ask questions and all were answered. The patient agreed with the plan and demonstrated an understanding of the instructions.   The patient was advised to call back or seek an in-person evaluation if the symptoms worsen or if the condition fails to improve as anticipated.  I provided 20 minutes of face-to-face time during this encounter.   I,Zite Okoli,acting as a Education administrator for Penni Homans, MD.,have documented all relevant documentation on the behalf  of Penni Homans, MD,as directed by  Penni Homans, MD while in the presence of Penni Homans, MD.     Penni Homans, MD [facility] [phone] (phone) [phone] (fax)  [place]

## 2021-01-10 NOTE — Assessment & Plan Note (Signed)
Encouraged moist heat and gentle stretching as tolerated. May try NSAIDs and prescription meds as directed and report if symptoms worsen or seek immediate care. Refills given on Oxycodone and Fentanyl and stable on current dose

## 2021-01-10 NOTE — Assessment & Plan Note (Signed)
Continues to struggle with candidal rash under both breasts. Partially responds to Diflucan. She has tried a compounded cream her friend gave her with good results. She will send [place] the name so we can consider a prescription

## 2021-01-10 NOTE — Assessment & Plan Note (Signed)
Monitor and report any concerns, no changes to meds. Encouraged heart healthy diet such as the DASH diet and exercise as tolerated.  ?

## 2021-01-10 NOTE — Assessment & Plan Note (Signed)
hgba1c acceptable, minimize simple carbs. Increase exercise as tolerated.  

## 2021-01-10 NOTE — Assessment & Plan Note (Signed)
hgba1c acceptable, minimize simple carbs. Increase exercise as tolerated. Continue current meds 

## 2021-01-10 NOTE — Assessment & Plan Note (Signed)
Encouraged moist heat and gentle stretching as tolerated. May try NSAIDs and prescription meds as directed and report if symptoms worsen or seek immediate care. [facility] and [facility] therapy and increased exercise. She will try and start and we can write a prescription if she needs it.

## 2021-01-10 NOTE — Assessment & Plan Note (Signed)
Encouraged DASH or MIND diet, decrease po intake and increase exercise as tolerated. Needs 7-8 hours of sleep nightly. Avoid trans fats, eat small, frequent meals every 4-5 hours with lean proteins, complex carbs and healthy fats. Minimize simple carbs, high fat foods and processed foods 

## 2021-01-18 ENCOUNTER — Telehealth: Payer: Self-pay | Admitting: *Deleted

## 2021-01-18 NOTE — Telephone Encounter (Addendum)
PA Case: [id number], Status: Approved, Coverage Starts on: 01/10/2021 12:00:00 AM, Coverage Ends on: 07/09/2021 12:00:00 AM.  Key: Julious Oka

## 2021-02-04 ENCOUNTER — Other Ambulatory Visit: Payer: Self-pay | Admitting: Family Medicine

## 2021-02-04 ENCOUNTER — Telehealth: Payer: Self-pay

## 2021-02-04 DIAGNOSIS — R52 Pain, unspecified: Secondary | ICD-10-CM

## 2021-02-04 MED ORDER — OXYCODONE-ACETAMINOPHEN 10-325 MG PO TABS: 2.0000 | ORAL_TABLET | ORAL | 0 refills | Status: DC | PRN

## 2021-02-04 NOTE — Telephone Encounter (Signed)
Oxycodone refill requerst Last RX:01-10-21 Last [id number] 01-10-21 Next [id number] 06-02-2020 UDS:10-15-19 CSC:07-05-2017  Patient requested this to be sent to [facility] in [place]

## 2021-02-14 ENCOUNTER — Other Ambulatory Visit: Payer: Self-pay | Admitting: Family Medicine

## 2021-02-14 ENCOUNTER — Telehealth: Payer: Self-pay | Admitting: Family Medicine

## 2021-02-14 DIAGNOSIS — M549 Dorsalgia, unspecified: Secondary | ICD-10-CM

## 2021-02-14 MED ORDER — FENTANYL 75 MCG/HR TD PT72: 1.0000 | MEDICATED_PATCH | TRANSDERMAL | 0 refills | Status: DC

## 2021-02-14 NOTE — Telephone Encounter (Signed)
Medication: fentaNYL ([place]) 75 MCG/HR   Has the patient contacted their pharmacy? No. (If no, request that the patient contact the pharmacy for the refill.) (If yes, when and what did the pharmacy advise?)  Preferred Pharmacy (with phone number or street name):  [facility] #[id number] [address], [place] - [place] AT [address] [place] [address]  Phone:  [phone]    Agent: Please be advised that RX refills may take up to 3 business days. We ask that you follow-up with your pharmacy.

## 2021-03-02 ENCOUNTER — Telehealth: Payer: Self-pay | Admitting: Family Medicine

## 2021-03-02 NOTE — Telephone Encounter (Signed)
Pt rescheduled but medication is not due to refilled until 03/06/21

## 2021-03-02 NOTE — Telephone Encounter (Signed)
Called patient to confirm CPE for tomorro Patient cannot make it so we resched for next year  It did say that we needed a UDS signed! What do we need to do about that????  Patient asked to refill this medication as well while we were on the phone  Medication: oxyCODONE-acetaminophen (PERCOCET) 10-325 MG tablet [[id number]]     Has the patient contacted their pharmacy? No. (If no, request that the patient contact the pharmacy for the refill.) (If yes, when and what did the pharmacy advise?)    Preferred Pharmacy (with phone number or street name): [facility] in [place]: Please be advised that RX refills may take up to 3 business days. We ask that you follow-up with your pharmacy.

## 2021-03-03 ENCOUNTER — Encounter: Payer: BLUE CROSS/BLUE SHIELD | Admitting: Family Medicine

## 2021-03-03 NOTE — Telephone Encounter (Signed)
Pt. Wants to know will the medication be available before the 16th because [facility] will be closed on Sunday so pt. Was wanting to pick up before closing.

## 2021-03-04 ENCOUNTER — Other Ambulatory Visit: Payer: Self-pay | Admitting: Family Medicine

## 2021-03-04 DIAGNOSIS — R52 Pain, unspecified: Secondary | ICD-10-CM

## 2021-03-04 MED ORDER — OXYCODONE-ACETAMINOPHEN 10-325 MG PO TABS: 2.0000 | ORAL_TABLET | ORAL | 0 refills | Status: DC | PRN

## 2021-03-17 ENCOUNTER — Other Ambulatory Visit: Payer: Self-pay | Admitting: Family Medicine

## 2021-03-17 DIAGNOSIS — M549 Dorsalgia, unspecified: Secondary | ICD-10-CM

## 2021-03-17 MED ORDER — FENTANYL 75 MCG/HR TD PT72: 1.0000 | MEDICATED_PATCH | TRANSDERMAL | 0 refills | Status: DC

## 2021-03-17 NOTE — Telephone Encounter (Signed)
Requesting: fentanyl  Contract: n/a UDS:n/a Last Visit:01/10/21 Next Visit:06/02/2021 Last Refill:02/14/21  Please Advise

## 2021-03-17 NOTE — Telephone Encounter (Signed)
Medication: fentaNYL ([place]) 75 MCG/HR   Has the patient contacted their pharmacy? No. (If no, request that the patient contact the pharmacy for the refill.) (If yes, when and what did the pharmacy advise?)  Preferred Pharmacy (with phone number or street name): [facility] #[id number] [address], [place] - [address] AT [address] [place] [address]  Phone:  [phone]  Fax:  [phone]   Agent: Please be advised that RX refills may take up to 3 business days. We ask that you follow-up with your pharmacy.

## 2021-04-01 ENCOUNTER — Other Ambulatory Visit: Payer: Self-pay | Admitting: Family Medicine

## 2021-04-01 ENCOUNTER — Telehealth: Payer: Self-pay | Admitting: Family Medicine

## 2021-04-01 DIAGNOSIS — R52 Pain, unspecified: Secondary | ICD-10-CM

## 2021-04-01 MED ORDER — OXYCODONE-ACETAMINOPHEN 10-325 MG PO TABS: 2.0000 | ORAL_TABLET | ORAL | 0 refills | Status: DC | PRN

## 2021-04-01 NOTE — Telephone Encounter (Signed)
Medication: oxyCODONE-acetaminophen (PERCOCET) 10-325 MG tablet   Has the patient contacted their pharmacy? No. (If no, request that the patient contact the pharmacy for the refill.) (If yes, when and what did the pharmacy advise?)  Preferred Pharmacy (with phone number or street name):  [facility] # [address] [place].  [place] [place] [address]  Phone:  [phone]  Fax:  [phone]  Agent: Please be advised that RX refills may take up to 3 business days. We ask that you follow-up with your pharmacy.

## 2021-04-18 ENCOUNTER — Telehealth: Payer: Self-pay | Admitting: Family Medicine

## 2021-04-18 NOTE — Telephone Encounter (Signed)
Medication: fentaNYL ([place]) 75 MCG/HR   Has the patient contacted their pharmacy? Yes.   (If no, request that the patient contact the pharmacy for the refill.) (If yes, when and what did the pharmacy advise?)  Preferred Pharmacy (with phone number or street name):  [facility] #[id number] [address], [place] - [place] AT [place] [address]  Phone:  [phone]  Fax:  [phone]  Agent: Please be advised that RX refills may take up to 3 business days. We ask that you follow-up with your pharmacy.

## 2021-04-19 ENCOUNTER — Telehealth: Payer: Self-pay | Admitting: *Deleted

## 2021-04-19 ENCOUNTER — Other Ambulatory Visit: Payer: Self-pay | Admitting: Family Medicine

## 2021-04-19 DIAGNOSIS — M549 Dorsalgia, unspecified: Secondary | ICD-10-CM

## 2021-04-19 MED ORDER — FENTANYL 75 MCG/HR TD PT72: 1.0000 | MEDICATED_PATCH | TRANSDERMAL | 0 refills | Status: DC

## 2021-04-19 NOTE — Telephone Encounter (Signed)
PA Case: [id number], Status: Approved, Coverage Starts on: 04/19/2021 12:00:00 AM, Coverage Ends on: 10/16/2021 12:00:00 AM.

## 2021-04-19 NOTE — Telephone Encounter (Signed)
Prior auth started via cover my meds.  Awaiting determination.  Key: [id number]

## 2021-04-21 ENCOUNTER — Telehealth: Payer: BLUE CROSS/BLUE SHIELD | Admitting: Family Medicine

## 2021-04-28 ENCOUNTER — Telehealth: Payer: Self-pay | Admitting: Family Medicine

## 2021-04-28 ENCOUNTER — Other Ambulatory Visit: Payer: Self-pay | Admitting: Family Medicine

## 2021-04-28 DIAGNOSIS — R52 Pain, unspecified: Secondary | ICD-10-CM

## 2021-04-28 MED ORDER — OXYCODONE-ACETAMINOPHEN 10-325 MG PO TABS: 2.0000 | ORAL_TABLET | ORAL | 0 refills | Status: DC | PRN

## 2021-04-28 NOTE — Telephone Encounter (Signed)
Requesting:percocet Contract:unknown UDS:10/15/19 Last Visit:01/10/21 Next Visit:1/12/2 Last Refill:  Please Advise

## 2021-04-28 NOTE — Telephone Encounter (Signed)
Medication: oxyCODONE-acetaminophen (PERCOCET) 10-325 MG tablet   Has the patient contacted their pharmacy? No. (If no, request that the patient contact the pharmacy for the refill.) (If yes, when and what did the pharmacy advise?)  Preferred Pharmacy (with phone number or street name): [facility] # [id number], [place].  [place] [place] [address]  Phone:  [phone]  Fax:  [phone]   Agent: Please be advised that RX refills may take up to 3 business days. We ask that you follow-up with your pharmacy.

## 2021-04-29 ENCOUNTER — Other Ambulatory Visit: Payer: Self-pay | Admitting: Family Medicine

## 2021-04-29 ENCOUNTER — Telehealth: Payer: Self-pay

## 2021-04-29 ENCOUNTER — Telehealth: Payer: Self-pay | Admitting: Family Medicine

## 2021-04-29 DIAGNOSIS — R52 Pain, unspecified: Secondary | ICD-10-CM

## 2021-04-29 MED ORDER — OXYCODONE-ACETAMINOPHEN 10-325 MG PO TABS: 2.0000 | ORAL_TABLET | ORAL | 0 refills | Status: DC | PRN

## 2021-04-29 NOTE — Telephone Encounter (Signed)
done

## 2021-04-29 NOTE — Telephone Encounter (Signed)
Med[facility] was [facility]. Medication: oxyCODONE-acetaminophen (PERCOCET) 10-325 MG tablet   Has the patient contacted their pharmacy? Yes.     Preferred Pharmacy: [facility] # [address], [place] [place] [address]  Phone:  [phone]  Fax:  [phone]

## 2021-04-29 NOTE — Telephone Encounter (Signed)
[place] called wanted clarification on a percocet script. They want to know if they are to dispense 15 day supply or do they give her 360 tablets for 30 day supply.

## 2021-05-09 ENCOUNTER — Other Ambulatory Visit: Payer: Self-pay | Admitting: Family Medicine

## 2021-05-09 NOTE — Telephone Encounter (Signed)
Patient states she is currently in [place] and the pharmacy there will be asking for a refill on her diltiazem (TIAZAC) 240 MG 24 hr capsule medication. She just wanted to give a heads up.

## 2021-05-20 ENCOUNTER — Telehealth: Payer: Self-pay | Admitting: Family Medicine

## 2021-05-20 ENCOUNTER — Other Ambulatory Visit: Payer: Self-pay | Admitting: Family Medicine

## 2021-05-20 DIAGNOSIS — M549 Dorsalgia, unspecified: Secondary | ICD-10-CM

## 2021-05-20 MED ORDER — FENTANYL 75 MCG/HR TD PT72: 1.0000 | MEDICATED_PATCH | TRANSDERMAL | 0 refills | Status: DC

## 2021-05-20 NOTE — Telephone Encounter (Signed)
Requesting: fentanyl 75 mcg Contract:unknown UDS: 10/15/19 Last Visit:01/10/21 Next Visit:06/02/21 Last Refill:04/19/21  Please Advise

## 2021-05-20 NOTE — Telephone Encounter (Signed)
Medication: fentaNYL ([place]) 75 MCG/HR   Has the patient contacted their pharmacy? No. (If no, request that the patient contact the pharmacy for the refill.) (If yes, when and what did the pharmacy advise?)  Preferred Pharmacy (with phone number or street name):  [facility] #[id number] [address], [place] [address] AT [place] [address]  Phone:  [phone]  Fax:  [phone]   Agent: Please be advised that RX refills may take up to 3 business days. We ask that you follow-up with your pharmacy.

## 2021-05-26 ENCOUNTER — Other Ambulatory Visit: Payer: Self-pay | Admitting: Family Medicine

## 2021-05-26 ENCOUNTER — Telehealth: Payer: Self-pay | Admitting: Family Medicine

## 2021-05-26 DIAGNOSIS — R52 Pain, unspecified: Secondary | ICD-10-CM

## 2021-05-26 MED ORDER — OXYCODONE-ACETAMINOPHEN 10-325 MG PO TABS: 2.0000 | ORAL_TABLET | ORAL | 0 refills | Status: DC | PRN

## 2021-05-26 NOTE — Telephone Encounter (Signed)
Requesting: oxycodone/apap Contract:  UDS: 10/15/19 Last Visit: 01/10/21 Next Visit: 06/02/20 Last Refill: 04/29/21  Please Advise

## 2021-05-26 NOTE — Telephone Encounter (Signed)
Medication: oxyCODONE-acetaminophen (PERCOCET) 10-325 MG tablet   Has the patient contacted their pharmacy? Yes.   (If no, request that the patient contact the pharmacy for the refill.) (If yes, when and what did the pharmacy advise?)  Preferred Pharmacy (with phone number or street name):  Preferred Pharmacy: [facility] # [address] [place].  [place] [place] [address]  Phone:  [phone]  Fax:  [phone]   Agent: Please be advised that RX refills may take up to 3 business days. We ask that you follow-up with your pharmacy.    [facility] [address] to be deleted from her pharmacy list. She only wants her Oxycodone to be sent to the [place] one.

## 2021-05-27 ENCOUNTER — Other Ambulatory Visit: Payer: Self-pay | Admitting: Family Medicine

## 2021-05-27 DIAGNOSIS — R52 Pain, unspecified: Secondary | ICD-10-CM

## 2021-05-27 MED ORDER — OXYCODONE-ACETAMINOPHEN 10-325 MG PO TABS: 2.0000 | ORAL_TABLET | ORAL | 0 refills | Status: DC | PRN

## 2021-05-27 NOTE — Telephone Encounter (Signed)
Pt called and stated the medication was sent to the wrong pharmacy! Its supposed to be sent to the pharmacy below :    [address], [place], [place] [address] [phone]   !!Patient would like the [facility] on [address] to be deleted from her pharmacy list!!

## 2021-06-02 ENCOUNTER — Other Ambulatory Visit: Payer: Self-pay | Admitting: Family Medicine

## 2021-06-02 ENCOUNTER — Telehealth: Payer: BLUE CROSS/BLUE SHIELD | Admitting: Family Medicine

## 2021-06-02 DIAGNOSIS — M8580 Other specified disorders of bone density and structure, unspecified site: Secondary | ICD-10-CM

## 2021-06-02 DIAGNOSIS — I10 Essential (primary) hypertension: Secondary | ICD-10-CM

## 2021-06-02 DIAGNOSIS — M05741 Rheumatoid arthritis with rheumatoid factor of right hand without organ or systems involvement: Secondary | ICD-10-CM

## 2021-06-02 DIAGNOSIS — R739 Hyperglycemia, unspecified: Secondary | ICD-10-CM

## 2021-06-02 MED ORDER — CEPHALEXIN 500 MG PO CAPS: 500.0000 mg | ORAL_CAPSULE | Freq: Three times a day (TID) | ORAL | 0 refills | Status: DC

## 2021-06-02 MED ORDER — ALBUTEROL SULFATE HFA 108 (90 BASE) MCG/ACT IN AERS: 2.0000 | INHALATION_SPRAY | Freq: Four times a day (QID) | RESPIRATORY_TRACT | 5 refills | Status: DC | PRN

## 2021-06-02 NOTE — Assessment & Plan Note (Signed)
hgba1c acceptable, minimize simple carbs. Increase exercise as tolerated.  

## 2021-06-02 NOTE — Assessment & Plan Note (Signed)
Encouraged to get adequate exercise, calcium and vitamin d intake 

## 2021-06-02 NOTE — Assessment & Plan Note (Signed)
Monitor and report any concerns, no changes to meds. Encouraged heart healthy diet such as the DASH diet and exercise as tolerated.  ?

## 2021-06-02 NOTE — Assessment & Plan Note (Signed)
Increased nodules on hands are no painful presently L>R

## 2021-06-20 ENCOUNTER — Telehealth: Payer: Self-pay | Admitting: Family Medicine

## 2021-06-20 ENCOUNTER — Other Ambulatory Visit: Payer: Self-pay | Admitting: Family Medicine

## 2021-06-20 DIAGNOSIS — M549 Dorsalgia, unspecified: Secondary | ICD-10-CM

## 2021-06-20 MED ORDER — FENTANYL 75 MCG/HR TD PT72: 1.0000 | MEDICATED_PATCH | TRANSDERMAL | 0 refills | Status: DC

## 2021-06-20 NOTE — Telephone Encounter (Signed)
Pt states she is completely out her rx. She asked if this could be filled as soon as possible.   Medication: fentaNYL ([place]) 75 MCG/HR  Has the patient contacted their pharmacy? Yes.     Preferred Pharmacy: [facility] #[id number] [facility], [place] - [place] AT [place] [address] [place]  [address] [place] [address]  Phone:  [phone]  Fax:  [phone]

## 2021-06-23 ENCOUNTER — Other Ambulatory Visit: Payer: Self-pay | Admitting: Family Medicine

## 2021-06-23 DIAGNOSIS — R52 Pain, unspecified: Secondary | ICD-10-CM

## 2021-06-23 MED ORDER — OXYCODONE-ACETAMINOPHEN 10-325 MG PO TABS: 2.0000 | ORAL_TABLET | ORAL | 0 refills | Status: DC | PRN

## 2021-06-23 NOTE — Telephone Encounter (Signed)
Medication:  oxyCODONE-acetaminophen (PERCOCET) 10-325 MG tablet [[id number]]   Has the patient contacted their pharmacy? No. (If no, request that the patient contact the pharmacy for the refill.) (If yes, when and what did the pharmacy advise?)     Preferred Pharmacy (with phone number or street name):  [facility] # [address], [place].  [place] [place] [address]  Phone:  [phone]  Fax:  [phone]     Agent: Please be advised that RX refills may take up to 3 business days. We ask that you follow-up with your pharmacy.

## 2021-06-24 ENCOUNTER — Other Ambulatory Visit: Payer: Self-pay

## 2021-06-24 DIAGNOSIS — R52 Pain, unspecified: Secondary | ICD-10-CM

## 2021-06-24 MED ORDER — OXYCODONE-ACETAMINOPHEN 10-325 MG PO TABS: 2.0000 | ORAL_TABLET | ORAL | 0 refills | Status: DC | PRN

## 2021-06-24 NOTE — Telephone Encounter (Signed)
Patient called back about medication , new pharmacy loaded , please advise

## 2021-06-24 NOTE — Telephone Encounter (Signed)
Patient states her Oxycodone was sent to the wrong pharmacy again. She would like for it to be sent to:  [facility] # [address], [place] [place] [address]  Phone:  [phone]  Fax:  [phone]   She states [facility] called [facility] and told them to get rid of it because she is not getting it from them. She would like for it to be sent today.

## 2021-06-26 ENCOUNTER — Other Ambulatory Visit: Payer: Self-pay | Admitting: Family Medicine

## 2021-06-26 DIAGNOSIS — R52 Pain, unspecified: Secondary | ICD-10-CM

## 2021-06-27 NOTE — Telephone Encounter (Signed)
Done

## 2021-07-19 ENCOUNTER — Other Ambulatory Visit: Payer: Self-pay | Admitting: Family Medicine

## 2021-07-19 DIAGNOSIS — M549 Dorsalgia, unspecified: Secondary | ICD-10-CM

## 2021-07-19 MED ORDER — FENTANYL 75 MCG/HR TD PT72: 1.0000 | MEDICATED_PATCH | TRANSDERMAL | 0 refills | Status: DC

## 2021-07-19 NOTE — Telephone Encounter (Signed)
Requesting: fentanyl 75 MCG/HR Contract: 10/05/19 UDS: 10/15/19 Last Visit: 01/10/21 Next Visit: 12/29/21 Last Refill: 06/20/2021  Please Advise

## 2021-07-19 NOTE — Telephone Encounter (Signed)
Medication:  fentaNYL ([place]) 75 MCG/HR [[id number]]     Has the patient contacted their pharmacy? No. (If no, request that the patient contact the pharmacy for the refill.) (If yes, when and what did the pharmacy advise?)     Preferred Pharmacy (with phone number or street name):  [facility] #[id number] [address], [place] - [place] AT [address] [place] [address]  Phone:  [phone]  Fax:  [phone]     Agent: Please be advised that RX refills may take up to 3 business days. We ask that you follow-up with your pharmacy.

## 2021-07-20 ENCOUNTER — Telehealth: Payer: Self-pay

## 2021-07-20 ENCOUNTER — Other Ambulatory Visit: Payer: Self-pay

## 2021-07-20 DIAGNOSIS — R52 Pain, unspecified: Secondary | ICD-10-CM

## 2021-07-20 DIAGNOSIS — M549 Dorsalgia, unspecified: Secondary | ICD-10-CM

## 2021-07-20 MED ORDER — FENTANYL 75 MCG/HR TD PT72: 1.0000 | MEDICATED_PATCH | TRANSDERMAL | 0 refills | Status: DC

## 2021-07-20 NOTE — Telephone Encounter (Signed)
Called pt and clarified preferred pharmacy. Pt only wants [facility], all other rx sent to [facility]. New refill request for fentanyl sent to [facility]. ?

## 2021-07-20 NOTE — Telephone Encounter (Signed)
Pt [facility]. She is always very nice and understanding, but this has happened every time she needs a refill for the past couple months.  ? ?Medication: fentaNYL (DURAGESIC) 75 MCG/HR ? ?Has the patient contacted their pharmacy? Yes.   ? ? ?Preferred Pharmacy: [id number], [place] [address] [facility] [place]  ?[address] [place] [address]  ?Phone:  [phone]  Fax:  [phone]  ? ?

## 2021-07-20 NOTE — Telephone Encounter (Signed)
Pt requests all refills EXCEPT percocet be sent to [facility]. ? ?Pt requests percocet be sent to [facility]. Marland Kitchen  ?

## 2021-07-20 NOTE — Progress Notes (Signed)
Refill for fentaNYL ([place]) 75 MCG/HR sent yesterday to the wrong pharmacy.  ? ?I have called and canceled the prescription at [facility]. Please send to [facility]. Refill pended. Thanks.  ? ? ? ?

## 2021-07-20 NOTE — Telephone Encounter (Signed)
PA initiated via Covermymeds; KEY: [id number]. PA approved  ? ?PA Case: [id number], Status: Approved, Coverage Starts on: 07/20/2021 12:00:00 AM, Coverage Ends on: 01/16/2022 12:00:00 AM. ?

## 2021-07-21 ENCOUNTER — Other Ambulatory Visit: Payer: Self-pay | Admitting: Family Medicine

## 2021-07-21 ENCOUNTER — Other Ambulatory Visit: Payer: Self-pay

## 2021-07-21 ENCOUNTER — Encounter: Payer: BLUE CROSS/BLUE SHIELD | Admitting: Family Medicine

## 2021-07-21 ENCOUNTER — Telehealth: Payer: Self-pay

## 2021-07-21 DIAGNOSIS — R52 Pain, unspecified: Secondary | ICD-10-CM

## 2021-07-21 MED ORDER — OXYCODONE-ACETAMINOPHEN 10-325 MG PO TABS: 2.0000 | ORAL_TABLET | ORAL | 0 refills | Status: DC | PRN

## 2021-07-21 NOTE — Telephone Encounter (Signed)
PA initiated via Covermymeds; KEY: [id number]. PA approved.  ? ?PA Case: [id number], Status: Approved, Coverage Starts on: 07/21/2021 12:00:00 AM, Coverage Ends on: 01/17/2022 12:00:00 AM. ?

## 2021-07-21 NOTE — Telephone Encounter (Signed)
Medication:  ?oxyCODONE-acetaminophen (PERCOCET) 10-325 MG tablet [[id number]]  ?  ? ?Has the patient contacted their pharmacy? No. ?(If no, request that the patient contact the pharmacy for the refill.) ?(If yes, when and what did the pharmacy advise?) ? ?  ? ?Preferred Pharmacy (with phone number or street name):  ?[id number] - [place], [place] - [address]  ?[place] [place] [address]  ?Phone:  [phone]  Fax:  [phone] ?  ? ?Agent: Please be advised that RX refills may take up to 3 business days. We ask that you follow-up with your pharmacy.  ?

## 2021-07-21 NOTE — Progress Notes (Unsigned)
Requesting: Percocet 10-325 MG ?Contract: 10/05/19 ?UDS: 10/15/19 ?Last Visit: 01/10/21 ?Next Visit: 12/29/21 ?Last Refill: 06/24/21, #180, 0 refills ? ?Please Advise  ?

## 2021-07-21 NOTE — Telephone Encounter (Signed)
Refill pended and sent to [facility]. ?

## 2021-07-22 ENCOUNTER — Other Ambulatory Visit: Payer: Self-pay

## 2021-07-22 ENCOUNTER — Other Ambulatory Visit: Payer: Self-pay | Admitting: Family Medicine

## 2021-07-22 ENCOUNTER — Telehealth: Payer: Self-pay | Admitting: *Deleted

## 2021-07-22 DIAGNOSIS — R52 Pain, unspecified: Secondary | ICD-10-CM

## 2021-07-22 MED ORDER — OXYCODONE-ACETAMINOPHEN 10-325 MG PO TABS: 2.0000 | ORAL_TABLET | ORAL | 0 refills | Status: DC | PRN

## 2021-07-22 NOTE — Telephone Encounter (Deleted)
Pharmacy deleted rx for oxycodone/apap 10-325mg .  [facility]? ?

## 2021-07-22 NOTE — Addendum Note (Signed)
Addended by: Ames Coupe on: 07/22/2021 12:04 PM ? ? Modules accepted: Orders ? ?

## 2021-07-22 NOTE — Telephone Encounter (Deleted)
Who Is Calling Pharmacy ?Call Type Pharmacy Send to RN ?Chief Complaint Prescription Refill or Medication Request (non ?symptomatic) ?Reason for Call Request to speak to Physician ?Initial Comment Caller states she is calling from [facility]. The ?patients script was deleted in the system and they ?need it resent. It is for Percoset 10 325mg  ?Pharmacy Name [facility] ?Pharmacist Name Kistler ?Pharmacy Number [phone] ?Translation No ?Disp. Time (Eastern ?Time) Disposition Final User ?07/21/2021 6:23:39 PM Send To Nurse Graylon Gunning, RN, Rhonda ?07/21/2021 6:54:35 PM Pharmacy Call Lavina Hamman, RN, Thomasena Edis ?Reason: Pharmacy clarification ?07/21/2021 6:57:46 PM Clinical Call Yes Lavina Hamman, RN, Thomasena Edis ?

## 2021-07-22 NOTE — Telephone Encounter (Signed)
Oxy is the only drug that [facility]. She is not mad but this has happened multiple.  ? ?Medication: oxyCODONE-acetaminophen (PERCOCET) 10-325 MG tablet  ? ?Has the patient contacted their pharmacy? No. ? ?Preferred Pharmacy: [facility] [address]  ?[place] [place] [address]  ?Phone:  [phone]  Fax:  [phone] ? ?

## 2021-07-22 NOTE — Telephone Encounter (Signed)
Please send in it [facility]. ?

## 2021-07-22 NOTE — Telephone Encounter (Signed)
PLEASE DISREGARD THIS MESSAGE.  ERROR ?

## 2021-07-22 NOTE — Progress Notes (Signed)
Error, please disregard.

## 2021-07-26 ENCOUNTER — Other Ambulatory Visit: Payer: Self-pay | Admitting: Family Medicine

## 2021-07-26 ENCOUNTER — Other Ambulatory Visit: Payer: Self-pay

## 2021-07-26 DIAGNOSIS — M549 Dorsalgia, unspecified: Secondary | ICD-10-CM

## 2021-07-26 MED ORDER — FENTANYL 75 MCG/HR TD PT72: 1.0000 | MEDICATED_PATCH | TRANSDERMAL | 0 refills | Status: DC

## 2021-07-26 NOTE — Progress Notes (Signed)
Patient notified that rx was sent in  

## 2021-07-26 NOTE — Telephone Encounter (Signed)
Refill request sent to [facility]. ?

## 2021-07-26 NOTE — Telephone Encounter (Signed)
Patient notified that rx has been sent in. 

## 2021-07-26 NOTE — Progress Notes (Unsigned)
Pt called and stated that her fentanyl patches are out of stock at [facility] (previous refill sent to [facility]). [facility] and [facility].  ?

## 2021-07-26 NOTE — Telephone Encounter (Signed)
Out of stock at [facility] please send here!  ? ?fentaNYL ([place]) 75 MCG/HR [[id number]] ? [facility]  ?[place] [address] ?[phone] ? ? ?

## 2021-08-11 ENCOUNTER — Other Ambulatory Visit: Payer: Self-pay | Admitting: Family Medicine

## 2021-08-11 DIAGNOSIS — I10 Essential (primary) hypertension: Secondary | ICD-10-CM

## 2021-08-12 ENCOUNTER — Telehealth: Payer: Self-pay | Admitting: Family Medicine

## 2021-08-12 ENCOUNTER — Other Ambulatory Visit: Payer: Self-pay | Admitting: Family Medicine

## 2021-08-12 NOTE — Telephone Encounter (Signed)
Patient states she has been having really bad cellulitis that [facility] has managed so far.  ?She states she is on her third round of antibiotics and they are sending her to specialist but she would really like advise from Dr. Charlett Blake. An appointment was offered but she stated she has missed too much work already and it would be really hard for her to come. She would like a call back from Dr. Charlett Blake today if possible to discuss her ongoing issues. Please advise.  ?

## 2021-08-15 NOTE — Telephone Encounter (Signed)
Pt scheduled for virtual visit per Dr. Charlett Blake. ?

## 2021-08-16 ENCOUNTER — Encounter: Payer: Self-pay | Admitting: Family Medicine

## 2021-08-16 ENCOUNTER — Telehealth (INDEPENDENT_AMBULATORY_CARE_PROVIDER_SITE_OTHER): Payer: BLUE CROSS/BLUE SHIELD | Admitting: Family Medicine

## 2021-08-16 VITALS — BP 124/74 | Ht 72.0 in | Wt 235.0 lb

## 2021-08-16 DIAGNOSIS — I10 Essential (primary) hypertension: Secondary | ICD-10-CM

## 2021-08-16 DIAGNOSIS — R52 Pain, unspecified: Secondary | ICD-10-CM | POA: Diagnosis not present

## 2021-08-16 DIAGNOSIS — G894 Chronic pain syndrome: Secondary | ICD-10-CM

## 2021-08-16 DIAGNOSIS — L03119 Cellulitis of unspecified part of limb: Secondary | ICD-10-CM | POA: Insufficient documentation

## 2021-08-16 DIAGNOSIS — R739 Hyperglycemia, unspecified: Secondary | ICD-10-CM

## 2021-08-16 DIAGNOSIS — L0390 Cellulitis, unspecified: Secondary | ICD-10-CM | POA: Diagnosis not present

## 2021-08-16 MED ORDER — CITALOPRAM HYDROBROMIDE 20 MG PO TABS: ORAL_TABLET | ORAL | 1 refills | Status: DC

## 2021-08-16 MED ORDER — OXYCODONE-ACETAMINOPHEN 10-325 MG PO TABS: 2.0000 | ORAL_TABLET | ORAL | 0 refills | Status: DC | PRN

## 2021-08-16 MED ORDER — LOSARTAN POTASSIUM 50 MG PO TABS: ORAL_TABLET | ORAL | 1 refills | Status: DC

## 2021-08-16 MED ORDER — HYDROCHLOROTHIAZIDE 12.5 MG PO CAPS: ORAL_CAPSULE | ORAL | 1 refills | Status: DC

## 2021-08-16 NOTE — Progress Notes (Signed)
? ? ?MyChart Video Visit ? ? ? ?Virtual Visit via Video Note  ? ?This visit type was conducted due to national recommendations for restrictions regarding the COVID-19 Pandemic (e.g. social distancing) in an effort to limit this patient's exposure and mitigate transmission in our community. This patient is at least at moderate risk for complications without adequate follow up. This format is felt to be most appropriate for this patient at this time. Physical exam was limited by quality of the video and audio technology used for the visit. [name], CMA was able to get the patient set up on a video visit. ? ?Patient location: home Patient and provider in visit ?Provider location: Office ? ?I discussed the limitations of evaluation and management by telemedicine and the availability of in person appointments. The patient expressed understanding and agreed to proceed. ? ?Visit Date: 08/16/2021 ? ?Today's healthcare provider: Penni Homans, MD  ? ? ? ?Subjective:  ? ? Patient ID: Jennifer Kelly, female    DOB: [birth year], 61 y.o.   MRN: [id number] ? ?Chief Complaint  ?Patient presents with  ? Cellulitis  ? ? ?HPI ?Patient is in today for further evaluation of persistent cellulitis and follow up on chronic medical concerns. She has been on 3 rounds of antibiotics, Bactrim then Keflex and now Doxycycline. It has improved but is still scabbed, painful and swollen. No complaints of fevers and chills. Lesion is on right lower extremity and is non healing so the urgent care she is seeing in [place] where she lives has set her up with a vascular surgeon and wound management clinic to help with her recovery. She is encouraged to keep the appointment. The pain has been debilitating but she is managing and stil working. Trying to keep her feet up as much as possible. No c/o CP/palp/SOB/HA/congestion/fevers/GI or GU c/o. Taking meds as prescribed  ? ?Past Medical History:  ?Diagnosis Date  ? Acute bronchitis 01/05/2014  ? Arthritis    ? Chicken pox as a child  ? Dermatitis 09/09/2014  ? Ganglion cyst of left foot 09/09/2014  ? H/O mumps   ? H/O tobacco use, presenting hazards to health 10/08/2009  ? Qualifier: Diagnosis of  By: Linda Hedges MD, Heinz Knuckles  Last cigarette 02/16/2016   ? History of chicken pox   ? Hyperglycemia 11/30/2015  ? Hypertension   ? Morbid obesity ([place]) 08/31/2009  ? Qualifier: Diagnosis of  By: Linda Hedges MD, Legrand Como E  BMI 34.4(Sept '13) BMI 34 (March '15)   ? Mumps as a child  ? Osteopenia 01/02/2017  ? Osteoporosis 11/20/2016  ? Psoriatic arthritis ([place])   ? Rheumatoid arthritis ([place])   ? Shingles 2014  ? ? ?Past Surgical History:  ?Procedure Laterality Date  ? BACK SURGERY  2003  ? NECK SURGERY  02/17/2016  ? C4, C5 & C6  ? rotater cuff  20 yrs ago  ? left shoulder  ? TOOTH EXTRACTION  05/2012  ? WISDOM TOOTH EXTRACTION  61 yrs old  ? ? ?Family History  ?Problem Relation Age of Onset  ? Parkinson's disease Mother   ? Heart disease Mother   ? Hypertension Mother   ? Diabetes Father   ?     type 2  ? Hypertension Father   ? Stroke Father   ? Vascular Disease Brother   ? Hypertension Brother   ? Diabetes Brother   ? Heart attack Paternal Uncle   ? ? ?Social History  ? ?Socioeconomic History  ? Marital status:  Divorced  ?  Spouse name: Not on file  ? Number of children: 0  ? Years of education: Not on file  ? Highest education level: Not on file  ?Occupational History  ? Occupation: help Therapist, sports  ?Tobacco Use  ? Smoking status: Former  ?  Types: Cigarettes  ?  Start date: 11/30/2015  ? Smokeless tobacco: Never  ?Substance and Sexual Activity  ? Alcohol use: Yes  ?  Comment: occ wine  ? Drug use: No  ? Sexual activity: Never  ?  Comment: no dietary restrictions. lives alone  ?Other Topics Concern  ? Not on file  ?Social History Narrative  ? Not on file  ? ?Social Determinants of Health  ? ?Financial Resource Strain: Not on file  ?Food Insecurity: Not on file  ?Transportation Needs: Not on file  ?Physical Activity: Not on file   ?Stress: Not on file  ?Social Connections: Not on file  ?Intimate Partner Violence: Not on file  ? ? ?Outpatient Medications Prior to Visit  ?Medication Sig Dispense Refill  ? abatacept (ORENCIA) 250 MG injection Inject into the vein.    ? ACTEMRA ACTPEN 162 MG/0.9ML SOAJ Inject into the skin.    ? albuterol (VENTOLIN HFA) 108 (90 Base) MCG/ACT inhaler Inhale 2 puffs into the lungs every 6 (six) hours as needed for wheezing or shortness of breath. 18 g 5  ? diltiazem (TIAZAC) 240 MG 24 hr capsule Take 1 capsule (240 mg total) by mouth daily. 90 capsule 1  ? doxycycline (VIBRA-TABS) 100 MG tablet Take 100 mg by mouth 2 (two) times daily.    ? fentaNYL (DURAGESIC) 75 MCG/HR Place 1 patch onto the skin every 3 (three) days. September 2022 10 patch 0  ? gabapentin (NEURONTIN) 600 MG tablet Take 1 tablet (600 mg total) by mouth 3 (three) times daily. 270 tablet 1  ? nystatin cream (MYCOSTATIN) Apply 1 application topically 2 (two) times daily as needed for dry skin. 30 g 0  ? venlafaxine XR (EFFEXOR-XR) 75 MG 24 hr capsule TAKE 1 CAPSULE BY MOUTH DAILY 90 capsule 1  ? cephALEXin (KEFLEX) 500 MG capsule Take 1 capsule (500 mg total) by mouth 3 (three) times daily. 21 capsule 0  ? citalopram (CELEXA) 20 MG tablet TAKE 1 TABLET(20 MG) BY MOUTH DAILY 90 tablet 1  ? hydrochlorothiazide (MICROZIDE) 12.5 MG capsule TAKE 1 CAPSULE(12.5 MG) BY MOUTH DAILY 90 capsule 1  ? losartan (COZAAR) 50 MG tablet TAKE 1 TABLET(50 MG) BY MOUTH DAILY 90 tablet 1  ? oxyCODONE-acetaminophen (PERCOCET) 10-325 MG tablet Take 2 tablets by mouth every 4 (four) hours as needed for pain. 180 tablet 0  ? ?No facility-administered medications prior to visit.  ? ? ?Allergies  ?Allergen Reactions  ? Simponi [Golimumab] Rash  ?  States that it was systemic  ? Erythromycin   ?  Upset stomach  ? ? ?ROS ? ?   ?Objective:  ?  ?Physical Exam ?Constitutional:   ?   General: She is not in acute distress. ?   Appearance: Normal appearance. She is not  ill-appearing or toxic-appearing.  ?HENT:  ?   Head: Normocephalic and atraumatic.  ?   Right Ear: External ear normal.  ?   Left Ear: External ear normal.  ?   Nose: Nose normal.  ?Eyes:  ?   General:     ?   Right eye: No discharge.     ?   Left eye: No discharge.  ?Pulmonary:  ?  Effort: Pulmonary effort is normal.  ?Skin: ?   Findings: Lesion present. No rash.  ?   Comments: Swelling scabbing and ecchymosis noted above lateral malleolus   ?Neurological:  ?   Mental Status: She is alert and oriented to person, place, and time.  ?Psychiatric:     ?   Behavior: Behavior normal.  ? ? ?BP 124/74   Ht 6' (1.829 m)   Wt 235 lb (106.6 kg)   BMI 31.87 kg/m?  ?Wt Readings from Last 3 Encounters:  ?08/16/21 235 lb (106.6 kg)  ?02/28/19 239 lb (108.4 kg)  ?02/11/18 238 lb (108 kg)  ? ? ?Diabetic Foot Exam - Simple   ?No data filed ?  ? ?Lab Results  ?Component Value Date  ? WBC 9.5 10/15/2019  ? HGB 15.1 10/15/2019  ? HCT 45 10/15/2019  ? PLT 208 10/15/2019  ? GLUCOSE 85 02/11/2018  ? CHOL 161 02/11/2018  ? TRIG 105.0 02/11/2018  ? HDL 68.90 02/11/2018  ? [place] 71 02/11/2018  ? ALT 34 10/15/2019  ? AST 23 10/15/2019  ? NA 138 10/15/2019  ? K 3.8 10/15/2019  ? CL 101 10/15/2019  ? CREATININE 0.63 02/11/2018  ? BUN 20 02/11/2018  ? CO2 28 (A) 10/15/2019  ? TSH 2.06 10/15/2019  ? HGBA1C 5.4 10/15/2019  ? HGBA1C 5.4 10/15/2019  ? ? ?Lab Results  ?Component Value Date  ? TSH 2.06 10/15/2019  ? ?Lab Results  ?Component Value Date  ? WBC 9.5 10/15/2019  ? HGB 15.1 10/15/2019  ? HCT 45 10/15/2019  ? MCV 90.6 02/11/2018  ? PLT 208 10/15/2019  ? ?Lab Results  ?Component Value Date  ? NA 138 10/15/2019  ? K 3.8 10/15/2019  ? CO2 28 (A) 10/15/2019  ? GLUCOSE 85 02/11/2018  ? BUN 20 02/11/2018  ? CREATININE 0.63 02/11/2018  ? BILITOT 0.6 02/11/2018  ? ALKPHOS 86 10/15/2019  ? AST 23 10/15/2019  ? ALT 34 10/15/2019  ? PROT 7.6 02/11/2018  ? ALBUMIN 4.0 10/15/2019  ? CALCIUM 9.1 10/15/2019  ? ANIONGAP 8 11/26/2014  ? GFR 103.01  02/11/2018  ? ?Lab Results  ?Component Value Date  ? CHOL 161 02/11/2018  ? ?Lab Results  ?Component Value Date  ? HDL 68.90 02/11/2018  ? ?Lab Results  ?Component Value Date  ? [place] 71 02/11/2018  ? ?Lab Results  ?[place]

## 2021-08-16 NOTE — Assessment & Plan Note (Signed)
Well controlled, no changes to meds. Encouraged heart healthy diet such as the DASH diet and exercise as tolerated.  °

## 2021-08-16 NOTE — Assessment & Plan Note (Signed)
hgba1c acceptable, minimize simple carbs. Increase exercise as tolerated.  

## 2021-08-16 NOTE — Assessment & Plan Note (Signed)
No change to chronic meds but restart Gabapentin and continue but change Ibuprofen dosing ?

## 2021-08-16 NOTE — Assessment & Plan Note (Addendum)
Has been on 3 rounds of antibiotics Bactrim, then Keflex and now Doxycyline 100 mg po bid she is following with an urgent care in [place] since she lives up there and they have now [facility] and she is encouraged to keep the appointments. Had stopped her Gabapentin but is encouraged to restart this and they started her Ibuprofen 800 mg po tid encouraged to change to 400 mg every 4-6 hours with prn ?

## 2021-08-24 ENCOUNTER — Other Ambulatory Visit: Payer: Self-pay | Admitting: Family Medicine

## 2021-08-24 DIAGNOSIS — M549 Dorsalgia, unspecified: Secondary | ICD-10-CM

## 2021-08-24 NOTE — Telephone Encounter (Signed)
Pt is needing a refill on her patches. Previous pharmacy used is on back order. Pharmacy listed is the only pharmacy that has this in stock.  ? ?Medication: fentaNYL (DURAGESIC) 75 MCG/HR ? ?Has the patient contacted their pharmacy? Yes.   ? ?[facility], [address], [place], [place] [address] ?[phone] ? ?

## 2021-08-25 MED ORDER — FENTANYL 75 MCG/HR TD PT72: 1.0000 | MEDICATED_PATCH | TRANSDERMAL | 0 refills | Status: DC

## 2021-08-25 NOTE — Telephone Encounter (Signed)
Requesting: fentanyl 54mg ?Contract: 10/05/19 ?UDS: 10/15/19 ?Last Visit: 01/10/21 ?Next Visit: 12/29/21 ?Last Refill: 07/26/21  ? ?[facility], [address], [place] [address]?

## 2021-09-08 ENCOUNTER — Other Ambulatory Visit: Payer: Self-pay | Admitting: Family Medicine

## 2021-09-13 ENCOUNTER — Other Ambulatory Visit: Payer: Self-pay | Admitting: Family Medicine

## 2021-09-13 DIAGNOSIS — R52 Pain, unspecified: Secondary | ICD-10-CM

## 2021-09-13 NOTE — Telephone Encounter (Signed)
Requesting: Percocet 10-325 ?Contract: 10/05/2019 ?UDS: 10/15/19 ?Last Visit: 01/10/21 ?Next Visit: 12/29/21 ?Last Refill: 08/16/21 ? ?[place] send to [facility]. Thanks ?

## 2021-09-13 NOTE — Telephone Encounter (Signed)
Pt called stating she needed a refill on her oxyCODONE and that she took her last pill this morning. Please Advise. ? ?Medication:  ? ?oxyCODONE-acetaminophen (PERCOCET) 10-325 MG tablet [[id number]]  ? ?Has the patient contacted their pharmacy? No. ?(If no, request that the patient contact the pharmacy for the refill.) ?(If yes, when and what did the pharmacy advise?) ? ?Preferred Pharmacy (with phone number or street name):  ? ?[id number] - [place], [place] - [address]  ?[place]., [place] [address]  ?Phone:  [phone]  Fax:  [phone]  ? ?Agent: Please be advised that RX refills may take up to 3 business days. We ask that you follow-up with your pharmacy. ? ?

## 2021-09-14 MED ORDER — OXYCODONE-ACETAMINOPHEN 10-325 MG PO TABS: 2.0000 | ORAL_TABLET | ORAL | 0 refills | Status: DC | PRN

## 2021-09-19 ENCOUNTER — Other Ambulatory Visit: Payer: Self-pay | Admitting: Family Medicine

## 2021-09-19 ENCOUNTER — Telehealth: Payer: Self-pay | Admitting: Family Medicine

## 2021-09-19 MED ORDER — OXYCODONE HCL 10 MG PO TABS: 20.0000 mg | ORAL_TABLET | ORAL | 0 refills | Status: DC | PRN

## 2021-09-19 NOTE — Telephone Encounter (Signed)
Please see phone note below. ? ?Pt requesting oxycodone 10 instead of 10-325, pharmacy is out. Please send to [facility]. ? ?I have called and canceled the 10-325 prescription.  ?

## 2021-09-19 NOTE — Telephone Encounter (Signed)
Patient is being discharged with staph infection  ? ?Patient states pharm is out of oxycodone 10-325 and needs oxycodone 10 ? ?[facility] - [address]  ?

## 2021-09-19 NOTE — Telephone Encounter (Signed)
Patient called for rx update  ? ?Please advise  ?

## 2021-09-20 NOTE — Telephone Encounter (Signed)
Opened in error

## 2021-09-27 ENCOUNTER — Other Ambulatory Visit: Payer: Self-pay | Admitting: Family Medicine

## 2021-09-27 DIAGNOSIS — M549 Dorsalgia, unspecified: Secondary | ICD-10-CM

## 2021-09-27 NOTE — Telephone Encounter (Signed)
Medication: fentaNYL (DURAGESIC) 75 MCG/HR  ? ?Has the patient contacted their pharmacy? Yes.   [facility]END TO [facility] ?Preferred Pharmacy (with phone number or street name):  [facility] [name], [place] [address] ?Phone: [phone] ? ?Agent: Please be advised that RX refills may take up to 3 business days. We ask that you follow-up with your pharmacy.  ?

## 2021-09-28 MED ORDER — FENTANYL 75 MCG/HR TD PT72: 1.0000 | MEDICATED_PATCH | TRANSDERMAL | 0 refills | Status: DC

## 2021-09-28 NOTE — Telephone Encounter (Signed)
Requesting: fentanyl 75 MCG ?Contract: 10/05/19 ?UDS: 10/15/19 ?Last Visit:08/16/21 ?Next Visit: 12/29/21 ?Last Refill: 08/25/21 ? ?[facility] ?[address] [place], [place] [address] ?

## 2021-10-06 ENCOUNTER — Other Ambulatory Visit: Payer: Self-pay | Admitting: Family Medicine

## 2021-10-07 ENCOUNTER — Other Ambulatory Visit: Payer: Self-pay | Admitting: Family Medicine

## 2021-10-07 ENCOUNTER — Telehealth: Payer: Self-pay | Admitting: Family Medicine

## 2021-10-07 ENCOUNTER — Encounter: Payer: Self-pay | Admitting: Family Medicine

## 2021-10-07 MED ORDER — OXYCODONE HCL 10 MG PO TABS: 20.0000 mg | ORAL_TABLET | ORAL | 0 refills | Status: DC | PRN

## 2021-10-07 NOTE — Telephone Encounter (Signed)
See note in previous message.  Requesting: oxycodone Contract:  UDS: 10/15/19 Last Visit: 08/16/21 Next Visit: 02/06/22 Last Refill:  09/19/21  Please Advise

## 2021-10-07 NOTE — Telephone Encounter (Signed)
Spoke with pharmacist. He states there are no problems with the prescription and it is currently being worked on.

## 2021-10-07 NOTE — Telephone Encounter (Signed)
Patient stating [facility] is denying medication  Please advise  Oxycodone HCl 10 MG TABS [[id number]]

## 2021-10-21 ENCOUNTER — Other Ambulatory Visit: Payer: Self-pay | Admitting: Family Medicine

## 2021-10-21 NOTE — Telephone Encounter (Signed)
Requesting: oxycodone HCI '10MG'$  Contract: 10/05/19 UDS: 10/15/19 Last Visit: 08/16/21 Next Visit:12/29/21 Last Refill: 10/07/21  [facility]

## 2021-10-21 NOTE — Telephone Encounter (Signed)
Pt has follow up 06/05  Medication: Oxycodone HCl 10 MG TABS  Has the patient contacted their pharmacy? Yes.    Preferred Pharmacy (with phone number or street name):  [facility] [address] [place].  [place] [place] [address]  Phone:  [phone]  Fax:  [phone]   Agent: Please be advised that RX refills may take up to 3 business days. We ask that you follow-up with your pharmacy.

## 2021-10-24 ENCOUNTER — Telehealth (INDEPENDENT_AMBULATORY_CARE_PROVIDER_SITE_OTHER): Payer: BLUE CROSS/BLUE SHIELD | Admitting: Family

## 2021-10-24 ENCOUNTER — Other Ambulatory Visit: Payer: Self-pay | Admitting: Family Medicine

## 2021-10-24 DIAGNOSIS — I10 Essential (primary) hypertension: Secondary | ICD-10-CM | POA: Diagnosis not present

## 2021-10-24 DIAGNOSIS — G894 Chronic pain syndrome: Secondary | ICD-10-CM | POA: Diagnosis not present

## 2021-10-24 DIAGNOSIS — T07XXXA Unspecified multiple injuries, initial encounter: Secondary | ICD-10-CM | POA: Diagnosis not present

## 2021-10-24 DIAGNOSIS — M25512 Pain in left shoulder: Secondary | ICD-10-CM | POA: Diagnosis not present

## 2021-10-24 MED ORDER — OXYCODONE HCL 10 MG PO TABS
20.0000 mg | ORAL_TABLET | ORAL | 0 refills | Status: DC | PRN
Start: 2021-10-24 — End: 2021-10-24

## 2021-10-24 MED ORDER — OXYCODONE HCL 10 MG PO TABS
20.0000 mg | ORAL_TABLET | ORAL | 0 refills | Status: DC | PRN
Start: 2021-10-24 — End: 2021-11-07

## 2021-10-24 NOTE — Assessment & Plan Note (Signed)
This is being managed by her rheumatologist currently.

## 2021-10-24 NOTE — Assessment & Plan Note (Signed)
BP Readings from Last 3 Encounters:  08/16/21 124/74  02/11/18 (!) 144/86  08/09/17 139/86   BP looks good. Continue hctz/losartan.

## 2021-10-24 NOTE — Telephone Encounter (Signed)
Patient comment: This refill goes to [facility]  in [place]! Please  This was filled today but sent to wrong pharmacy can you resend to [facility].

## 2021-10-24 NOTE — Assessment & Plan Note (Signed)
Uncontrolled. She is frustrated with the slow healing process. She was unable to show them to me on today's visit as they were dressed. She continues to follow with the wound care nurses at [facility]. She is requesting to see an MD at the wound care clinic. I advised her to call the wound care center and request MD visit. If they are unable, she will let me know and I will try to get her a second opinion at another wound care center.

## 2021-10-24 NOTE — Assessment & Plan Note (Signed)
She is due for contract and UDS and is advised to make sure that her next appointment with PCP is in person.

## 2021-10-24 NOTE — Progress Notes (Signed)
MyChart Video Visit    Virtual Visit via Video Note   This visit type was conducted due to national recommendations for restrictions regarding the COVID-19 Pandemic (e.g. social distancing) in an effort to limit this patient's exposure and mitigate transmission in our community. This patient is at least at moderate risk for complications without adequate follow up. This format is felt to be most appropriate for this patient at this time. Physical exam was limited by quality of the video and audio technology used for the visit. CMA was able to get the patient set up on a video visit.  Patient location: Home Patient and provider in visit Provider location: Office  I discussed the limitations of evaluation and management by telemedicine and the availability of in person appointments. The patient expressed understanding and agreed to proceed.  Visit Date: 10/24/2021  Today's healthcare provider: Nance Pear, NP     Subjective:    Patient ID: Jennifer Kelly, female    DOB: 1961-02-04, 61 y.o.   MRN: [id number]  Chief Complaint  Patient presents with   Rheumathoid arthritis    Patient reports RA flare up, "unable to take RA medications due to cellulitis of right leg"    HPI Patient is in today for a video visit.   Cellulitis- She has RA since 61 years old. She continues having occasional flare ups. She developed cellulitis 2 days after receiving an injection on March, 2023 to manage her RA. She also developed a staff infection. She was admitted to the hospital shortly after and was given anti-biotics. She has a wound where her cellulitis was on her right leg. She continues seeing a wound care specialist but finds there is no improvement in her wound. She has an upcomming appointment with wound care. She prefers to see her wound care doctor over the nurses so she can discuss her wound care.   Rheumatoid arthritis- She complains her rheumatoid arthritis is flaring up. She was  has pain in her shoulder and has difficulty raising her arm above her elbow. She was given and injection and found no change in her symptoms. She continues seeing her rheumatologist regularly. She notes her rheumatologist thinks her current symptoms are something other than her rheumatoid arthritis.   Blood pressure- She reports her blood pressure was 141/65 last week at her rheumatologists office. She continues taking 12.5 mg hydrochlorothiazide daily PO, 50 mg losartan daily PO, 240 mg diltiazem daily PO and reports no new issues while taking it.  BP Readings from Last 3 Encounters:  08/16/21 124/74  02/11/18 (!) 144/86  08/09/17 139/86   Pulse Readings from Last 3 Encounters:  02/11/18 64  08/09/17 76  05/10/17 73   Mood- she continues taking 20 mg Celexa daily PO and reports her mood doing well while taking it.    Past Medical History:  Diagnosis Date   Acute bronchitis 01/05/2014   Arthritis    Chicken pox as a child   Dermatitis 09/09/2014   Ganglion cyst of left foot 09/09/2014   H/O mumps    H/O tobacco use, presenting hazards to health 10/08/2009   Qualifier: Diagnosis of  By: Linda Hedges MD, Heinz Knuckles  Last cigarette 02/16/2016    History of chicken pox    Hyperglycemia 11/30/2015   Hypertension    Morbid obesity ([place]) 08/31/2009   Qualifier: Diagnosis of  By: Linda Hedges MD, Heinz Knuckles  BMI 34.4(Sept '13) BMI 34 (March '15)    Mumps as a child  Osteopenia 01/02/2017   Osteoporosis 11/20/2016   Psoriatic arthritis ([place])    Rheumatoid arthritis ([place])    Shingles 2014    Past Surgical History:  Procedure Laterality Date   BACK SURGERY  2003   NECK SURGERY  02/17/2016   C4, C5 & C6   rotater cuff  20 yrs ago   left shoulder   TOOTH EXTRACTION  05/2012   WISDOM TOOTH EXTRACTION  61 yrs old    Family History  Problem Relation Age of Onset   Parkinson's disease Mother    Heart disease Mother    Hypertension Mother    Diabetes Father        type 2   Hypertension Father     Stroke Father    Vascular Disease Brother    Hypertension Brother    Diabetes Brother    Heart attack Paternal Uncle     Social History   Socioeconomic History   Marital status: Divorced    Spouse name: Not on file   Number of children: 0   Years of education: Not on file   Highest education level: Not on file  Occupational History   Occupation: help Therapist, sports  Tobacco Use   Smoking status: Former    Types: Cigarettes    Start date: 11/30/2015   Smokeless tobacco: Never  Substance and Sexual Activity   Alcohol use: Yes    Comment: occ wine   Drug use: No   Sexual activity: Never    Comment: no dietary restrictions. lives alone  Other Topics Concern   Not on file  Social History Narrative   Not on file   Social Determinants of Health   Financial Resource Strain: Not on file  Food Insecurity: Not on file  Transportation Needs: Not on file  Physical Activity: Not on file  Stress: Not on file  Social Connections: Not on file  Intimate Partner Violence: Not on file    Outpatient Medications Prior to Visit  Medication Sig Dispense Refill   ACTEMRA ACTPEN 162 MG/0.9ML SOAJ Inject into the skin.     albuterol (VENTOLIN HFA) 108 (90 Base) MCG/ACT inhaler Inhale 2 puffs into the lungs every 6 (six) hours as needed for wheezing or shortness of breath. 18 g 5   citalopram (CELEXA) 20 MG tablet TAKE 1 TABLET(20 MG) BY MOUTH DAILY 90 tablet 1   diltiazem (TIAZAC) 240 MG 24 hr capsule Take 1 capsule (240 mg total) by mouth daily. 90 capsule 1   doxycycline (VIBRA-TABS) 100 MG tablet Take 100 mg by mouth 2 (two) times daily.     fentaNYL (DURAGESIC) 75 MCG/HR Place 1 patch onto the skin every 3 (three) days. September 2022 10 patch 0   gabapentin (NEURONTIN) 600 MG tablet Take 1 tablet (600 mg total) by mouth 3 (three) times daily. 270 tablet 1   hydrochlorothiazide (MICROZIDE) 12.5 MG capsule TAKE 1 CAPSULE(12.5 MG) BY MOUTH DAILY 90 capsule 1   losartan (COZAAR) 50 MG  tablet TAKE 1 TABLET(50 MG) BY MOUTH DAILY 90 tablet 1   Oxycodone HCl 10 MG TABS Take 2 tablets (20 mg total) by mouth every 4 (four) hours as needed. 180 tablet 0   venlafaxine XR (EFFEXOR-XR) 75 MG 24 hr capsule TAKE 1 CAPSULE BY MOUTH DAILY 90 capsule 1   abatacept (ORENCIA) 250 MG injection Inject into the vein.     nystatin cream (MYCOSTATIN) Apply 1 application topically 2 (two) times daily as needed for dry skin. 30 g 0  No facility-administered medications prior to visit.    Allergies  Allergen Reactions   Simponi [Golimumab] Rash    States that it was systemic   Erythromycin     Upset stomach    Review of Systems  Musculoskeletal:  Positive for joint pain (shoulder pain).      Objective:    Physical Exam  There were no vitals taken for this visit. Wt Readings from Last 3 Encounters:  08/16/21 235 lb (106.6 kg)  02/28/19 239 lb (108.4 kg)  02/11/18 238 lb (108 kg)     Assessment & Plan:   Problem List Items Addressed This Visit       Unprioritized   Wounds, multiple    Uncontrolled. She is frustrated with the slow healing process. She was unable to show them to me on today's visit as they were dressed. She continues to follow with the wound care nurses at [facility]. She is requesting to see an MD at the wound care clinic. I advised her to call the wound care center and request MD visit. If they are unable, she will let me know and I will try to get her a second opinion at another wound care center.        Left shoulder pain - Primary    This is being managed by her rheumatologist currently.        Hypertension    BP Readings from Last 3 Encounters:  08/16/21 124/74  02/11/18 (!) 144/86  08/09/17 139/86  BP looks good. Continue hctz/losartan.       Chronic pain syndrome    She is due for contract and UDS and is advised to make sure that her next appointment with PCP is in person.         No orders of the defined types were placed in this  encounter.   I discussed the assessment and treatment plan with the patient. The patient was provided an opportunity to ask questions and all were answered. The patient agreed with the plan and demonstrated an understanding of the instructions.   The patient was advised to call back or seek an in-person evaluation if the symptoms worsen or if the condition fails to improve as anticipated.  I,Shehryar Multimedia programmer as a Education administrator for [employer], NP.,have documented all relevant documentation on the behalf of Nance Pear, NP,as directed by  Nance Pear, NP while in the presence of Nance Pear, NP.  I provided 20 minutes of face-to-face time during this encounter.   Nance Pear, NP [employer] at [employer] [phone] (phone) [phone] (fax)  [place]

## 2021-11-01 ENCOUNTER — Telehealth: Payer: Self-pay

## 2021-11-01 DIAGNOSIS — M549 Dorsalgia, unspecified: Secondary | ICD-10-CM

## 2021-11-01 MED ORDER — FENTANYL 75 MCG/HR TD PT72: 1.0000 | MEDICATED_PATCH | TRANSDERMAL | 0 refills | Status: DC

## 2021-11-01 NOTE — Telephone Encounter (Signed)
Requesting: Fentanyl 49mg/hr Contract: 07/05/2017 UDS: 10/15/19 Last Visit: 10/24/21 w/ MLenna SciaraNext Visit: 11/10/21 Last Refill: 09/28/21 #10 and 0RF  Please Advise

## 2021-11-02 ENCOUNTER — Other Ambulatory Visit: Payer: Self-pay | Admitting: Family Medicine

## 2021-11-02 DIAGNOSIS — M549 Dorsalgia, unspecified: Secondary | ICD-10-CM

## 2021-11-02 MED ORDER — FENTANYL 75 MCG/HR TD PT72: 1.0000 | MEDICATED_PATCH | TRANSDERMAL | 0 refills | Status: DC

## 2021-11-02 NOTE — Telephone Encounter (Signed)
Refilled yesterday. 

## 2021-11-02 NOTE — Telephone Encounter (Signed)
Pt wanted to make pcp aware they are needing a new PA for patches.

## 2021-11-02 NOTE — Telephone Encounter (Signed)
Pt called stating she needed the fentanyl patches sent to the following pharmacy due to [facility] unable to fill the Rx:  [id number] [address] Phone:  [phone] Fax:  [phone]

## 2021-11-03 NOTE — Telephone Encounter (Signed)
PA for fentanyl patches submitted, awaiting response from insurance company.

## 2021-11-04 ENCOUNTER — Telehealth: Payer: Self-pay

## 2021-11-04 NOTE — Telephone Encounter (Signed)
PA initiated via Covermymeds; KEY:  [id number]. PA approved.   PA Case: [id number], Status: Approved, Coverage Starts on: 11/04/2021 12:00:00 AM, Coverage Ends on: 05/03/2022 12:00:00 AM.

## 2021-11-07 ENCOUNTER — Other Ambulatory Visit: Payer: Self-pay

## 2021-11-07 ENCOUNTER — Other Ambulatory Visit: Payer: Self-pay | Admitting: Family Medicine

## 2021-11-07 MED ORDER — OXYCODONE HCL 10 MG PO TABS
20.0000 mg | ORAL_TABLET | ORAL | 0 refills | Status: DC | PRN
Start: 2021-11-07 — End: 2021-11-08

## 2021-11-07 NOTE — Telephone Encounter (Signed)
Requesting: oxycodone Contract: n/a [id number] Last Visit:10/24/21 Next Visit:11/10/21 Last Refill:10/24/21  Please Advise

## 2021-11-08 ENCOUNTER — Telehealth: Payer: Self-pay | Admitting: Family Medicine

## 2021-11-08 ENCOUNTER — Other Ambulatory Visit: Payer: Self-pay | Admitting: Family Medicine

## 2021-11-08 MED ORDER — OXYCODONE HCL 10 MG PO TABS: 20.0000 mg | ORAL_TABLET | ORAL | 0 refills | Status: DC | PRN

## 2021-11-08 NOTE — Telephone Encounter (Signed)
Can you resend the oxycodone to [facility] in [place]? It was sent to the wrong pharmacy.

## 2021-11-08 NOTE — Telephone Encounter (Signed)
Pt called stating that her [facility]cod[facility] and needed it sent to the following pharmacy:  [facility] # [address] [place]., [place] [address]  Phone:  [phone]  Fax:  [phone]

## 2021-11-09 NOTE — Progress Notes (Signed)
MyChart Video Visit    Virtual Visit via Video Note   This visit type was conducted due to national recommendations for restrictions regarding the COVID-19 Pandemic (e.g. social distancing) in an effort to limit this patient's exposure and mitigate transmission in our community. This patient is at least at moderate risk for complications without adequate follow up. This format is felt to be most appropriate for this patient at this time. Physical exam was limited by quality of the video and audio technology used for the visit. Jennifer Kelly., CMA was able to get the patient set up on a video visit.  Patient location: home Patient and provider in visit Provider location: Office  I discussed the limitations of evaluation and management by telemedicine and the availability of in person appointments. The patient expressed understanding and agreed to proceed.  Visit Date: 11/10/2021  Today's healthcare provider: Penni Homans, MD     Subjective:    Patient ID: Jennifer Kelly, female    DOB: December 08, 1960, 61 y.o.   MRN: [id number]  Chief Complaint  Patient presents with   Follow-up    HPI Patient is in today for a follow up on chronic medical concerns. No recent febrile illness or acute hospitalizations. She is experiencing a worsening of her left shoulder pain and an MRI done by her rheumatologist has found a lesion. Will request records to evaluate. She continues to struggle with chronic wounds in her leg which are improving but still present. She has been unable to take her RA meds as a result so all of her pain is worse. She just had to put her 22 year old dog to sleep yesterday and her sister is terminally ill. Denies CP/palp/SOB/HA/congestion/fevers/GI or GU c/o. Taking meds as prescribed   Past Medical History:  Diagnosis Date   Acute bronchitis 01/05/2014   Arthritis    Chicken pox as a child   Dermatitis 09/09/2014   Ganglion cyst of left foot 09/09/2014   H/O mumps    H/O tobacco  use, presenting hazards to health 10/08/2009   Qualifier: Diagnosis of  By: Linda Hedges MD, Heinz Knuckles  Last cigarette 02/16/2016    History of chicken pox    Hyperglycemia 11/30/2015   Hypertension    Morbid obesity ([place]) 08/31/2009   Qualifier: Diagnosis of  By: Linda Hedges MD, Heinz Knuckles  BMI 34.4(Sept '13) BMI 34 (March '15)    Mumps as a child   Osteopenia 01/02/2017   Osteoporosis 11/20/2016   Psoriatic arthritis ([place])    Rheumatoid arthritis ([place])    Shingles 2014    Past Surgical History:  Procedure Laterality Date   BACK SURGERY  2003   NECK SURGERY  02/17/2016   C4, C5 & C6   rotater cuff  20 yrs ago   left shoulder   TOOTH EXTRACTION  05/2012   WISDOM TOOTH EXTRACTION  61 yrs old    Family History  Problem Relation Age of Onset   Parkinson's disease Mother    Heart disease Mother    Hypertension Mother    Diabetes Father        type 2   Hypertension Father    Stroke Father    Vascular Disease Brother    Hypertension Brother    Diabetes Brother    Heart attack Paternal Uncle     Social History   Socioeconomic History   Marital status: Divorced    Spouse name: Not on file   Number of children: 0  Years of education: Not on file   Highest education level: Not on file  Occupational History   Occupation: help desk coordinator  Tobacco Use   Smoking status: Former    Types: Cigarettes    Start date: 11/30/2015   Smokeless tobacco: Never  Substance and Sexual Activity   Alcohol use: Yes    Comment: occ wine   Drug use: No   Sexual activity: Never    Comment: no dietary restrictions. lives alone  Other Topics Concern   Not on file  Social History Narrative   Not on file   Social Determinants of Health   Financial Resource Strain: Not on file  Food Insecurity: Not on file  Transportation Needs: Not on file  Physical Activity: Not on file  Stress: Not on file  Social Connections: Not on file  Intimate Partner Violence: Not on file    Outpatient Medications  Prior to Visit  Medication Sig Dispense Refill   ACTEMRA ACTPEN 162 MG/0.9ML SOAJ Inject into the skin.     albuterol (VENTOLIN HFA) 108 (90 Base) MCG/ACT inhaler Inhale 2 puffs into the lungs every 6 (six) hours as needed for wheezing or shortness of breath. 18 g 5   citalopram (CELEXA) 20 MG tablet TAKE 1 TABLET(20 MG) BY MOUTH DAILY 90 tablet 1   diltiazem (TIAZAC) 240 MG 24 hr capsule Take 1 capsule (240 mg total) by mouth daily. 90 capsule 1   doxycycline (VIBRA-TABS) 100 MG tablet Take 100 mg by mouth 2 (two) times daily.     fentaNYL (DURAGESIC) 75 MCG/HR Place 1 patch onto the skin every 3 (three) days. 10 patch 0   gabapentin (NEURONTIN) 600 MG tablet Take 1 tablet (600 mg total) by mouth 3 (three) times daily. 270 tablet 1   hydrochlorothiazide (MICROZIDE) 12.5 MG capsule TAKE 1 CAPSULE(12.5 MG) BY MOUTH DAILY 90 capsule 1   losartan (COZAAR) 50 MG tablet TAKE 1 TABLET(50 MG) BY MOUTH DAILY 90 tablet 1   Oxycodone HCl 10 MG TABS Take 2 tablets (20 mg total) by mouth every 4 (four) hours as needed. 180 tablet 0   venlafaxine XR (EFFEXOR-XR) 75 MG 24 hr capsule TAKE 1 CAPSULE BY MOUTH DAILY 90 capsule 1   No facility-administered medications prior to visit.    Allergies  Allergen Reactions   Simponi [Golimumab] Rash    States that it was systemic   Erythromycin     Upset stomach    Review of Systems  Constitutional:  Positive for malaise/fatigue. Negative for fever.  HENT:  Negative for congestion.   Eyes:  Negative for blurred vision.  Respiratory:  Negative for shortness of breath.   Cardiovascular:  Positive for leg swelling. Negative for chest pain and palpitations.  Gastrointestinal:  Negative for abdominal pain, blood in stool and nausea.  Genitourinary:  Negative for dysuria and frequency.  Musculoskeletal:  Positive for back pain, joint pain and myalgias. Negative for falls.  Skin:  Positive for rash.  Neurological:  Negative for dizziness, loss of consciousness and  headaches.  Endo/Heme/Allergies:  Negative for environmental allergies.  Psychiatric/Behavioral:  Positive for depression. The patient is nervous/anxious.        Objective:    Physical Exam Constitutional:      General: She is not in acute distress.    Appearance: Normal appearance. She is not ill-appearing or toxic-appearing.  HENT:     Head: Normocephalic and atraumatic.     Right Ear: External ear normal.  Left Ear: External ear normal.     Nose: Nose normal.  Eyes:     General:        Right eye: No discharge.        Left eye: No discharge.  Pulmonary:     Effort: Pulmonary effort is normal.  Skin:    Findings: No rash.  Neurological:     Mental Status: She is alert and oriented to person, place, and time.  Psychiatric:        Behavior: Behavior normal.     There were no vitals taken for this visit. Wt Readings from Last 3 Encounters:  08/16/21 235 lb (106.6 kg)  02/28/19 239 lb (108.4 kg)  02/11/18 238 lb (108 kg)    Diabetic Foot Exam - Simple   No data filed    Lab Results  Component Value Date   WBC 9.5 10/15/2019   HGB 15.1 10/15/2019   HCT 45 10/15/2019   PLT 208 10/15/2019   GLUCOSE 85 02/11/2018   CHOL 161 02/11/2018   TRIG 105.0 02/11/2018   HDL 68.90 02/11/2018   LDLCALC 71 02/11/2018   ALT 34 10/15/2019   AST 23 10/15/2019   NA 138 10/15/2019   K 3.8 10/15/2019   CL 101 10/15/2019   CREATININE 0.63 02/11/2018   BUN 20 02/11/2018   CO2 28 (A) 10/15/2019   TSH 2.06 10/15/2019   HGBA1C 5.4 10/15/2019   HGBA1C 5.4 10/15/2019    Lab Results  Component Value Date   TSH 2.06 10/15/2019   Lab Results  Component Value Date   WBC 9.5 10/15/2019   HGB 15.1 10/15/2019   HCT 45 10/15/2019   MCV 90.6 02/11/2018   PLT 208 10/15/2019   Lab Results  Component Value Date   NA 138 10/15/2019   K 3.8 10/15/2019   CO2 28 (A) 10/15/2019   GLUCOSE 85 02/11/2018   BUN 20 02/11/2018   CREATININE 0.63 02/11/2018   BILITOT 0.6 02/11/2018    ALKPHOS 86 10/15/2019   AST 23 10/15/2019   ALT 34 10/15/2019   PROT 7.6 02/11/2018   ALBUMIN 4.0 10/15/2019   CALCIUM 9.1 10/15/2019   ANIONGAP 8 11/26/2014   GFR 103.01 02/11/2018   Lab Results  Component Value Date   CHOL 161 02/11/2018   Lab Results  Component Value Date   HDL 68.90 02/11/2018   Lab Results  Component Value Date   LDLCALC 71 02/11/2018   Lab Results  Component Value Date   TRIG 105.0 02/11/2018   Lab Results  Component Value Date   CHOLHDL 2 02/11/2018   Lab Results  Component Value Date   HGBA1C 5.4 10/15/2019   HGBA1C 5.4 10/15/2019       Assessment & Plan:   Problem List Items Addressed This Visit     Rheumatoid arthritis ([place])    Follows with Rheumatology and Actemra is on hold due to infection so her pain is worse      Hypertension - Primary    Monitor and report any concerns, no changes to meds. Encouraged heart healthy diet such as the DASH diet and exercise as tolerated.       Hyperglycemia    hgba1c acceptable, minimize simple carbs. Increase exercise as tolerated.       Recurrent cellulitis of lower leg    Right leg is healing very slowly working with wound care weekly, she has had to stay off her RA med Actemra since early March due to this  so her pain has worsened. She is following with [facility] in [place] and she has a bone scan ordered soon.       Left shoulder pain    She reports [facility] has done an MRI that showed a lesion, unclear etiology will try and get a copy of the MRI to evaluate      Wounds, multiple   Other Visit Diagnoses     High risk medication use           I am having Marishka M. Delancy "Di" maintain her gabapentin, venlafaxine XR, albuterol, diltiazem, Actemra ACTPen, doxycycline, hydrochlorothiazide, losartan, citalopram, fentaNYL, and Oxycodone HCl.  No orders of the defined types were placed in this encounter.   I discussed the assessment and treatment plan with the patient. The  patient was provided an opportunity to ask questions and all were answered. The patient agreed with the plan and demonstrated an understanding of the instructions.   The patient was advised to call back or seek an in-person evaluation if the symptoms worsen or if the condition fails to improve as anticipated.    Penni Homans, MD [facility] at [facility] [phone] (phone) [phone] (fax)  [place]

## 2021-11-10 ENCOUNTER — Telehealth (INDEPENDENT_AMBULATORY_CARE_PROVIDER_SITE_OTHER): Payer: BLUE CROSS/BLUE SHIELD | Admitting: Family Medicine

## 2021-11-10 ENCOUNTER — Encounter: Payer: Self-pay | Admitting: Family Medicine

## 2021-11-10 DIAGNOSIS — I10 Essential (primary) hypertension: Secondary | ICD-10-CM

## 2021-11-10 DIAGNOSIS — T07XXXA Unspecified multiple injuries, initial encounter: Secondary | ICD-10-CM | POA: Diagnosis not present

## 2021-11-10 DIAGNOSIS — M05741 Rheumatoid arthritis with rheumatoid factor of right hand without organ or systems involvement: Secondary | ICD-10-CM

## 2021-11-10 DIAGNOSIS — Z79899 Other long term (current) drug therapy: Secondary | ICD-10-CM | POA: Diagnosis not present

## 2021-11-10 DIAGNOSIS — R739 Hyperglycemia, unspecified: Secondary | ICD-10-CM

## 2021-11-10 DIAGNOSIS — M25512 Pain in left shoulder: Secondary | ICD-10-CM

## 2021-11-10 DIAGNOSIS — M05742 Rheumatoid arthritis with rheumatoid factor of left hand without organ or systems involvement: Secondary | ICD-10-CM

## 2021-11-10 DIAGNOSIS — L03119 Cellulitis of unspecified part of limb: Secondary | ICD-10-CM

## 2021-11-10 NOTE — Assessment & Plan Note (Signed)
Follows with Rheumatology and Actemra is on hold due to infection so her pain is worse

## 2021-11-10 NOTE — Patient Instructions (Signed)

## 2021-11-10 NOTE — Assessment & Plan Note (Signed)
Right leg is healing very slowly working with wound care weekly, she has had to stay off her RA med Actemra since early March due to this so her pain has worsened. She is following with [facility] in [place] and she has a bone scan ordered soon.

## 2021-11-10 NOTE — Assessment & Plan Note (Signed)
hgba1c acceptable, minimize simple carbs. Increase exercise as tolerated.  

## 2021-11-23 ENCOUNTER — Other Ambulatory Visit: Payer: Self-pay | Admitting: Family Medicine

## 2021-11-23 MED ORDER — OXYCODONE HCL 10 MG PO TABS: 20.0000 mg | ORAL_TABLET | ORAL | 0 refills | Status: DC | PRN

## 2021-11-23 NOTE — Telephone Encounter (Signed)
Medication: Oxycodone HCl 10 MG TABS  Has the patient contacted their pharmacy? No.  Preferred Pharmacy: [facility] # [id number], [place]., [place] [address]  Phone:  [phone]  Fax:  [phone]

## 2021-11-23 NOTE — Telephone Encounter (Signed)
Requesting: Oxycodone HCI 10 MG Contract: n/a UDS: 2021 Last Visit: 11/10/21 Next Visit: 02/06/22 Last Refill:  11/08/21  Please Advise

## 2021-11-24 MED ORDER — OXYCODONE HCL 10 MG PO TABS: 20.0000 mg | ORAL_TABLET | ORAL | 0 refills | Status: DC | PRN

## 2021-11-27 ENCOUNTER — Other Ambulatory Visit: Payer: Self-pay | Admitting: Family Medicine

## 2021-12-07 ENCOUNTER — Other Ambulatory Visit: Payer: Self-pay | Admitting: Family Medicine

## 2021-12-07 DIAGNOSIS — M549 Dorsalgia, unspecified: Secondary | ICD-10-CM

## 2021-12-07 NOTE — Telephone Encounter (Signed)
Medication: fentaNYL   Has the patient contacted their pharmacy? No.  Preferred Pharmacy (with phone number or street name):   [id number] [place] [address] Phone:  [phone] Fax:  [phone]  Agent: Please be advised that RX refills may take up to 3 business days. We ask that you follow-up with your pharmacy.

## 2021-12-08 NOTE — Telephone Encounter (Signed)
Requesting: fentanyl 75 MCG Contract: n/a UDS: 2021 Last Visit: 11/10/21 Next Visit: 02/06/22 Last Refill: 11/02/21  Please Advise

## 2021-12-09 ENCOUNTER — Other Ambulatory Visit: Payer: Self-pay | Admitting: Family Medicine

## 2021-12-09 ENCOUNTER — Telehealth: Payer: Self-pay | Admitting: Family Medicine

## 2021-12-09 DIAGNOSIS — M549 Dorsalgia, unspecified: Secondary | ICD-10-CM

## 2021-12-09 MED ORDER — FENTANYL 75 MCG/HR TD PT72: 1.0000 | MEDICATED_PATCH | TRANSDERMAL | 0 refills | Status: DC

## 2021-12-09 NOTE — Telephone Encounter (Signed)
Fentanyl refill request sent on 12/08/21. Too early to fill the oxycodone, oxycodone was sent in on 11/24/21.

## 2021-12-09 NOTE — Telephone Encounter (Signed)
Pt stated she needed a refill on fentaNYL (DURAGESIC) 75 MCG/HR and never received it.    [id number] [address] Phone:  [phone] Fax:  [phone]   She also is needing a refill of Oxycodone HCl 10 MG TABS.   [facility] [address] [place] Phone:  [phone] Fax:  [phone]

## 2021-12-12 MED ORDER — OXYCODONE HCL 10 MG PO TABS: 20.0000 mg | ORAL_TABLET | ORAL | 0 refills | Status: DC | PRN

## 2021-12-12 NOTE — Telephone Encounter (Signed)
Pt message: Please send to [facility] in [place] on [address]. Noticed this medication is listed twice, I will attempt to remove the duplicate. Had appointment at [facility] with Dr. Jason Coop 720/23, he is an oncologist orthopedic specialist, that Dr Boris Lown RAdoc referred me to, also  had a CT scan with contrast yesterday. I requested Dr Charlett Blake also be sent  record/results.    Requesting: oxycodone '10mg'$   Contract:07/05/2017 UDS: 10/15/19 Last Visit: 11/10/21 Next Visit: 02/06/22 Last Refill: 11/24/21 #180 and 0RF  Please Advise

## 2021-12-23 ENCOUNTER — Telehealth: Payer: Self-pay | Admitting: Family Medicine

## 2021-12-23 NOTE — Telephone Encounter (Signed)
Medication: Oxycodone HCl 10 MG TABS  Has the patient contacted their pharmacy? No.  Preferred Pharmacy:  [facility] # [address], [place]., [place] [address]  Phone:  [phone]  Fax:  [phone]   Pt also wanted to advise she has been diagnosed with myeloma, she stated she would be sending a mychart msg with more information.

## 2021-12-23 NOTE — Telephone Encounter (Addendum)
Called pharmacy and they are getting medication from 12/12/21 processed for the pt. Pt aware    Pharmacy called back and started that the script from the 12/12/21 is too much. Just wondering if there was a change that she would need so many pills. I told them you would not be back in until Tuesday and will discuss it then.

## 2021-12-26 NOTE — Telephone Encounter (Signed)
Pharmacy called and stated that she has been getting 180 tablets every 15 days and just wondering if that is correct.

## 2021-12-28 ENCOUNTER — Other Ambulatory Visit: Payer: Self-pay | Admitting: Family Medicine

## 2021-12-28 MED ORDER — NALOXONE HCL 4 MG/0.1ML NA LIQD: NASAL | 2 refills | Status: DC

## 2021-12-29 ENCOUNTER — Other Ambulatory Visit: Payer: Self-pay

## 2021-12-29 ENCOUNTER — Encounter: Payer: BLUE CROSS/BLUE SHIELD | Admitting: Family Medicine

## 2021-12-29 MED ORDER — NALOXONE HCL 4 MG/0.1ML NA LIQD: NASAL | 2 refills | Status: AC

## 2021-12-29 NOTE — Telephone Encounter (Signed)
Lvm with below 

## 2021-12-29 NOTE — Telephone Encounter (Signed)
LVM with pharmacy for icd-10 code 90.0

## 2022-01-02 ENCOUNTER — Telehealth: Payer: Self-pay | Admitting: Family Medicine

## 2022-01-02 ENCOUNTER — Other Ambulatory Visit: Payer: Self-pay | Admitting: Family Medicine

## 2022-01-02 NOTE — Telephone Encounter (Signed)
Pt states she was only given half of her supply on 8/4 and she is needing a full 15 day supply.   Medication: Oxycodone HCl 10 MG TABS  Has the patient contacted their pharmacy? No.  Preferred Pharmacy:  [facility] # [address] [place] [place] [address]  Phone:  [phone]  Fax:  [phone]

## 2022-01-03 ENCOUNTER — Other Ambulatory Visit: Payer: Self-pay | Admitting: Family Medicine

## 2022-01-03 MED ORDER — OXYCODONE HCL 10 MG PO TABS: 20.0000 mg | ORAL_TABLET | ORAL | 0 refills | Status: DC | PRN

## 2022-01-03 NOTE — Telephone Encounter (Signed)
Patient notified that 90 oxycodone was sent in.  Also advised to make sure she gets the Narcan.  She stated she will pick up when she gets the oxycodone.

## 2022-01-03 NOTE — Telephone Encounter (Signed)
Pt states she was only given half of her supply on 8/4 and she is needing a full 15 day supply.   Requesting: oxycodone '10mg'$   Contract:07/05/2017 UDS: 10/15/19 Last Visit: 11/10/21 Next Visit: 02/06/22 Last Refill: 12/15/21

## 2022-01-12 ENCOUNTER — Other Ambulatory Visit: Payer: Self-pay | Admitting: Family Medicine

## 2022-01-12 ENCOUNTER — Telehealth: Payer: Self-pay | Admitting: Family Medicine

## 2022-01-12 MED ORDER — OXYCODONE HCL 10 MG PO TABS
20.0000 mg | ORAL_TABLET | ORAL | 0 refills | Status: DC | PRN
Start: 2022-01-12 — End: 2022-01-30

## 2022-01-12 NOTE — Telephone Encounter (Signed)
Medication:  Oxycodone HCl 10 MG TABS  Has the patient contacted their pharmacy? Yes.   Patient states [facility] will do the full refill of the medication, no need to send just half.   Preferred Pharmacy (with phone number or street name):  [facility] # [address] [place].  [place] [place] [address]  Phone:  [phone]  Fax:  [phone]   Agent: Please be advised that RX refills may take up to 3 business days. We ask that you follow-up with your pharmacy.

## 2022-01-12 NOTE — Telephone Encounter (Signed)
Duplicate request

## 2022-01-12 NOTE — Telephone Encounter (Signed)
Requesting: oxycodone  Contract:07/05/2017 UDS: 10/15/19 Last Visit: 11/10/21 Next Visit: 02/06/22 Last Refill: 01/03/22 #90 and 0RF  Per Pt- she was unable to get full supply? Pharmacy is now needing new Rx.   Please Advise

## 2022-01-25 ENCOUNTER — Other Ambulatory Visit: Payer: Self-pay | Admitting: Family Medicine

## 2022-01-25 ENCOUNTER — Telehealth: Payer: Self-pay | Admitting: Family Medicine

## 2022-01-25 DIAGNOSIS — M549 Dorsalgia, unspecified: Secondary | ICD-10-CM

## 2022-01-25 MED ORDER — FENTANYL 75 MCG/HR TD PT72: 1.0000 | MEDICATED_PATCH | TRANSDERMAL | 0 refills | Status: DC

## 2022-01-25 NOTE — Telephone Encounter (Signed)
Medication: fentaNYL ([place]) 75 MCG/HR [[id number]]     Preferred Pharmacy (with phone number or street name): [id number] [address] Phone:  [phone] Fax:  [phone]  Agent: Please be advised that RX refills may take up to 3 business days. We ask that you follow-up with your pharmacy.

## 2022-01-26 ENCOUNTER — Telehealth: Payer: Self-pay

## 2022-01-26 NOTE — Telephone Encounter (Signed)
PA initiated via Covermymeds; KEY:  [id number]. PA approved.   PA Case: [id number], Status: Approved, Coverage Starts on: 01/26/2022 12:00:00 AM, Coverage Ends on: 07/25/2022 12:00:00 AM.

## 2022-01-30 ENCOUNTER — Other Ambulatory Visit: Payer: Self-pay | Admitting: Family Medicine

## 2022-01-30 ENCOUNTER — Telehealth: Payer: Self-pay | Admitting: Family Medicine

## 2022-01-30 MED ORDER — OXYCODONE HCL 10 MG PO TABS: 20.0000 mg | ORAL_TABLET | ORAL | 0 refills | Status: DC | PRN

## 2022-01-30 NOTE — Telephone Encounter (Signed)
Medication:   Oxycodone HCl 10 MG TABS [[id number]]   Has the patient contacted their pharmacy? No. (If no, request that the patient contact the pharmacy for the refill.) (If yes, when and what did the pharmacy advise?)  Preferred Pharmacy (with phone number or street name):   [facility] # [address], [place].  [place] [place] [address]  Phone:  [phone]  Fax:  [phone]   Agent: Please be advised that RX refills may take up to 3 business days. We ask that you follow-up with your pharmacy.

## 2022-02-06 ENCOUNTER — Encounter: Payer: BLUE CROSS/BLUE SHIELD | Admitting: Family Medicine

## 2022-02-06 DIAGNOSIS — D480 Neoplasm of uncertain behavior of bone and articular cartilage: Secondary | ICD-10-CM | POA: Insufficient documentation

## 2022-02-11 ENCOUNTER — Other Ambulatory Visit: Payer: Self-pay | Admitting: Family Medicine

## 2022-02-15 ENCOUNTER — Other Ambulatory Visit: Payer: Self-pay | Admitting: Family Medicine

## 2022-02-15 NOTE — Telephone Encounter (Signed)
Medication: Oxycodone HCl 10 MG TABS [[id number]]    Preferred Pharmacy (with phone number or street name): [facility] # [address], [place] [place] [address]  Phone:  [phone]  Fax:  [phone]   Agent: Please be advised that RX refills may take up to 3 business days. We ask that you follow-up with your pharmacy.

## 2022-02-15 NOTE — Telephone Encounter (Signed)
FYI- patient wanted Dr. Charlett Blake know that she had surgery on 9/18 at [facility]. They removed a tumor and she is waiting on the additional pathology.

## 2022-02-16 ENCOUNTER — Other Ambulatory Visit: Payer: Self-pay

## 2022-02-16 ENCOUNTER — Telehealth: Payer: Self-pay

## 2022-02-16 MED ORDER — OXYCODONE HCL 10 MG PO TABS: 20.0000 mg | ORAL_TABLET | ORAL | 0 refills | Status: DC | PRN

## 2022-02-16 NOTE — Telephone Encounter (Signed)
PA initiated via Covermymeds; KEY;[id number]. Awaiting determination.

## 2022-02-16 NOTE — Telephone Encounter (Signed)
Requesting: oxycodone Contract: n/a UDS: n/a Last Visit:11/10/21 Next Visit:06/27/22 Last Refill:01/30/22  Please Advise

## 2022-02-16 NOTE — Telephone Encounter (Signed)
PA approved.   PA Case: [id number], Status: Approved, Coverage Starts on: 02/16/2022 12:00:00 AM, Coverage Ends on: 08/15/2022 12:00:00 AM.

## 2022-02-16 NOTE — Telephone Encounter (Signed)
Spoke with Jennifer Kelly at [employer] ---  Oxycodone cancelled   Correct pharmacy loaded

## 2022-02-16 NOTE — Telephone Encounter (Signed)
Spoke with Anderson Malta at [employer] cancelled at [employer] note with correct pharmacy uploaded sent to PCP

## 2022-02-16 NOTE — Telephone Encounter (Signed)
Patient's medication was sent to the wrong pharmacy. Please send to:  [facility] # [address] [place] [place] [address]  Phone:  [phone]  Fax:  [phone]

## 2022-02-20 ENCOUNTER — Other Ambulatory Visit: Payer: Self-pay | Admitting: Family Medicine

## 2022-02-20 ENCOUNTER — Telehealth: Payer: Self-pay | Admitting: Family Medicine

## 2022-02-20 MED ORDER — OXYCODONE HCL 10 MG PO TABS: 20.0000 mg | ORAL_TABLET | ORAL | 0 refills | Status: DC | PRN

## 2022-02-20 NOTE — Telephone Encounter (Signed)
Pt stated when she went to pick up O[facility]one HCl 10 MG TABS [facility] only [facility] tablets. She took the tablets, but is still needing the rest of her prescription. Given [facility] not [facility], [facility] and will need it sent there instead.    Medication: Oxycodone HCl 10 MG TABS  Has the patient contacted their pharmacy? Yes.     Preferred Pharmacy:  [facility] [address] [place], [place] [address] [phone]

## 2022-02-20 NOTE — Telephone Encounter (Signed)
Patient would like the [facility].  I did change in chart for you.  The other pharmacy only had 21 in stock.

## 2022-02-21 ENCOUNTER — Encounter: Payer: Self-pay | Admitting: *Deleted

## 2022-02-21 ENCOUNTER — Telehealth: Payer: Self-pay | Admitting: Family Medicine

## 2022-02-21 MED ORDER — OXYCODONE HCL 10 MG PO TABS: 20.0000 mg | ORAL_TABLET | ORAL | 0 refills | Status: DC | PRN

## 2022-02-21 NOTE — Telephone Encounter (Signed)
Needed additional information to fill oxycodone rx.

## 2022-02-21 NOTE — Telephone Encounter (Signed)
Patient called back concerned that the [facility] will soon run out of the pills to fill the remaining portion for her prescription and she will be out of the 21 pills she received. Please call patient to advise.

## 2022-02-21 NOTE — Telephone Encounter (Signed)
Dorothea Ogle from [employer] called stating they needed some clarification on the Oxycodone that was sent in for the pt. All CMAs were unavailable at the time and advised rep that a note would be sent back to call back later. Dorothea Ogle acknowledged understanding.

## 2022-02-21 NOTE — Telephone Encounter (Signed)
That is correct however it needs to be sent to [facility] on [address]  Can you resend and I will call and cancel that at the other [facility].  Order pended for you to go to correct pharmacy.

## 2022-02-21 NOTE — Telephone Encounter (Signed)
Answer to previous question: That is correct (159) however it needs to be sent to [facility] on [address]  Can you resend and I will call and cancel that [facility].  Order pended for you to go to correct pharmac

## 2022-02-21 NOTE — Telephone Encounter (Signed)
Patient notified that rx has been sent to correct place

## 2022-03-02 ENCOUNTER — Telehealth: Payer: Self-pay | Admitting: Family Medicine

## 2022-03-02 NOTE — Telephone Encounter (Signed)
Medication: fentaNYL ([place]) 75 MCG/HR [    Preferred Pharmacy (with phone number or street name): [facility]Phone: [phone] Fax: [phone]  #[phone](phone)  Agent: Please be advised that RX refills may take up to 3 business days. We ask that you follow-up with your pharmacy.

## 2022-03-03 ENCOUNTER — Other Ambulatory Visit: Payer: Self-pay | Admitting: Family Medicine

## 2022-03-03 DIAGNOSIS — M549 Dorsalgia, unspecified: Secondary | ICD-10-CM

## 2022-03-03 MED ORDER — FENTANYL 75 MCG/HR TD PT72: 1.0000 | MEDICATED_PATCH | TRANSDERMAL | 0 refills | Status: DC

## 2022-03-08 ENCOUNTER — Other Ambulatory Visit: Payer: Self-pay | Admitting: Family Medicine

## 2022-03-08 NOTE — Telephone Encounter (Signed)
Pharmacy only has 60 tablets, she will call back for the rest of the refill once it is in stock. [facility]. She only used the other one last time when this pharmacy did not have it in stock.   Medication: Oxycodone HCl 10 MG TABS  Has the patient contacted their pharmacy? Yes.     Preferred Pharmacy:  [facility]  [place], [place], [place] [address]

## 2022-03-09 ENCOUNTER — Telehealth: Payer: Self-pay

## 2022-03-09 ENCOUNTER — Other Ambulatory Visit: Payer: Self-pay | Admitting: Family Medicine

## 2022-03-09 MED ORDER — OXYCODONE HCL 10 MG PO TABS: 20.0000 mg | ORAL_TABLET | ORAL | 0 refills | Status: DC | PRN

## 2022-03-09 NOTE — Telephone Encounter (Signed)
Done

## 2022-03-09 NOTE — Telephone Encounter (Signed)
Please advise  Last fill date 02/21/2022

## 2022-03-10 ENCOUNTER — Other Ambulatory Visit: Payer: Self-pay

## 2022-03-10 ENCOUNTER — Other Ambulatory Visit: Payer: Self-pay | Admitting: Family Medicine

## 2022-03-10 MED ORDER — OXYCODONE HCL 10 MG PO TABS: 20.0000 mg | ORAL_TABLET | ORAL | 0 refills | Status: DC | PRN

## 2022-03-10 NOTE — Telephone Encounter (Signed)
Patient Oxycodone HCl 10 MG TABS called into [facility] in [place] but should have been sent to [place], [place]  [address]  [phone]. Due to the shortage issues, [facility] is [facility]. Patient cancelled prescription at the [facility]. Please call it in for patient to correct pharmacy and advise when it has been sent.

## 2022-03-14 ENCOUNTER — Telehealth: Payer: Self-pay | Admitting: Family Medicine

## 2022-03-14 NOTE — Telephone Encounter (Signed)
Patient called to advise that the [facility] only had 30 pills on Friday when she got her prescription for  Oxycodone HCl 10 MG TABS  .  [facility] now has the full prescription available so patient would like for the FULL prescription to be sent to:   [facility] # [address], [place] [place] [address] Phone: [phone]  Fax: [phone]   Please call patient if you have any questions.

## 2022-03-15 ENCOUNTER — Other Ambulatory Visit: Payer: Self-pay | Admitting: Family Medicine

## 2022-03-15 MED ORDER — OXYCODONE HCL 10 MG PO TABS: 20.0000 mg | ORAL_TABLET | ORAL | 0 refills | Status: DC | PRN

## 2022-03-15 NOTE — Telephone Encounter (Signed)
Called pt was advised Provider is out of the office, however the request was  Sent to her. Pt was advised Dr. Charlett Blake is out of the office but are in meeting and will respond when she free.

## 2022-03-15 NOTE — Telephone Encounter (Signed)
Pt called back because she has not heard anything from [place] or the pharmacy. She wanted to reiterate when she went to the pharmacy last Friday they only had 30 tablets of Oxycodone HCl 10 MG TABS so she was not given a full refill. She is now out and is needing the full 180 fill. Please contact pt if there is an issue, she will also put a mychart msg in.   Oxycodone HCl 10 MG TABS  [facility] - [place], [place] [place] [address] Phone: [phone]  Fax: [phone]

## 2022-03-30 ENCOUNTER — Other Ambulatory Visit: Payer: Self-pay | Admitting: Family Medicine

## 2022-03-30 ENCOUNTER — Telehealth: Payer: Self-pay | Admitting: Family Medicine

## 2022-03-30 MED ORDER — OXYCODONE HCL 10 MG PO TABS: 20.0000 mg | ORAL_TABLET | ORAL | 0 refills | Status: DC | PRN

## 2022-03-30 NOTE — Telephone Encounter (Signed)
Patient said she wanted to go ahead and call medication in since it's almost the weekend and the pharmacy did confirm they have the whole prescription available.     Medication: Oxycodone HCl 10 MG TABS   Preferred Pharmacy (with phone number or street name):  [facility] # [address], [place] [address] [place] [place] [address] Phone: [phone]  Fax: [phone]    Agent: Please be advised that RX refills may take up to 3 business days. We ask that you follow-up with your pharmacy.

## 2022-04-17 ENCOUNTER — Telehealth: Payer: Self-pay | Admitting: Family Medicine

## 2022-04-17 ENCOUNTER — Other Ambulatory Visit: Payer: Self-pay | Admitting: Family Medicine

## 2022-04-17 MED ORDER — OXYCODONE HCL 10 MG PO TABS: 20.0000 mg | ORAL_TABLET | ORAL | 0 refills | Status: DC | PRN

## 2022-04-17 NOTE — Telephone Encounter (Signed)
Medication: Oxycodone HCl 10 MG TABS   Has the patient contacted their pharmacy? No.  Preferred Pharmacy (with phone number or street name):  [facility] # [address] [place]. [place] [place] [address] Phone: [phone]  Fax: [phone]

## 2022-04-17 NOTE — Telephone Encounter (Signed)
Requesting: oxycodone Contract: n/a UDS: n/a Last Visit:11/10/21 Nex Visit:06/27/22   Will get updated UDS at next appointment

## 2022-04-27 ENCOUNTER — Other Ambulatory Visit: Payer: Self-pay | Admitting: Family Medicine

## 2022-04-28 ENCOUNTER — Telehealth: Payer: Self-pay | Admitting: Family Medicine

## 2022-04-28 NOTE — Telephone Encounter (Signed)
Medication: fentaNYL ([place]) 75 MCG/HR  Has the patient contacted their pharmacy? No.  [facility]  [facility] [address] [place]Phone: [phone] Fax: [phone]

## 2022-04-29 ENCOUNTER — Other Ambulatory Visit: Payer: Self-pay | Admitting: Family Medicine

## 2022-04-29 DIAGNOSIS — M549 Dorsalgia, unspecified: Secondary | ICD-10-CM

## 2022-04-29 MED ORDER — FENTANYL 75 MCG/HR TD PT72: 1.0000 | MEDICATED_PATCH | TRANSDERMAL | 0 refills | Status: DC

## 2022-05-01 ENCOUNTER — Other Ambulatory Visit: Payer: Self-pay

## 2022-05-01 NOTE — Telephone Encounter (Signed)
Prescription cancel

## 2022-05-03 ENCOUNTER — Telehealth: Payer: Self-pay | Admitting: Family Medicine

## 2022-05-03 NOTE — Telephone Encounter (Signed)
Medication: Oxycodone HCl 10 MG TABS [[id number]]   Has the patient contacted their pharmacy? No.   Preferred Pharmacy (with phone number or street name):  [facility] # [address], [place]. [place] [place] [address] Phone: [phone]  Fax: [phone]    Agent: Please be advised that RX refills may take up to 3 business days. We ask that you follow-up with your pharmacy.

## 2022-05-04 ENCOUNTER — Other Ambulatory Visit: Payer: Self-pay | Admitting: Family Medicine

## 2022-05-04 NOTE — Telephone Encounter (Signed)
Patient called to follow up on refill request °

## 2022-05-04 NOTE — Telephone Encounter (Signed)
See Pt's message:  : I also called in yesterday & again this afternoon. The last refill they were 30 tablets short as they keep being out of stock or short. Just wanted to let you know that as well. They currently say they have enough to do a complete refill.  Hopefully so, to get through Christmas. Thank you & Angela Nevin Christmas to you & yours !

## 2022-05-05 MED ORDER — OXYCODONE HCL 10 MG PO TABS: 20.0000 mg | ORAL_TABLET | ORAL | 0 refills | Status: DC | PRN

## 2022-05-19 ENCOUNTER — Other Ambulatory Visit: Payer: Self-pay | Admitting: Family Medicine

## 2022-05-19 ENCOUNTER — Telehealth: Payer: Self-pay | Admitting: Family Medicine

## 2022-05-19 ENCOUNTER — Other Ambulatory Visit: Payer: Self-pay

## 2022-05-19 MED ORDER — OXYCODONE HCL 10 MG PO TABS: 20.0000 mg | ORAL_TABLET | ORAL | 0 refills | Status: DC | PRN

## 2022-05-19 NOTE — Telephone Encounter (Signed)
Patient called stating that she needs a refill on her Oxycodone HCl 10 MG TABS. Sent to her pharmacy [id number] - [place], [place] [address] Best call back number for patient is [phone].

## 2022-05-19 NOTE — Telephone Encounter (Signed)
Requesting:Oxycodone Contract: 07/05/2017 UDS: N/A Last Visit: 11/10/2021 Next Visit: 06/27/2022 Last Refill: 05/05/2022  Please Advise

## 2022-06-02 ENCOUNTER — Other Ambulatory Visit: Payer: Self-pay | Admitting: Family Medicine

## 2022-06-02 ENCOUNTER — Telehealth: Payer: Self-pay | Admitting: Family Medicine

## 2022-06-02 MED ORDER — OXYCODONE HCL 10 MG PO TABS: 20.0000 mg | ORAL_TABLET | ORAL | 0 refills | Status: DC | PRN

## 2022-06-02 NOTE — Telephone Encounter (Signed)
Prescription Request  06/02/2022  Is this a "Controlled Substance" medicine? Yes  LOV: Visit date not found  What is the name of the medication or equipment? Oxycodone HCl 10 MG TABS   Have you contacted your pharmacy to request a refill? No   Which pharmacy would you like this sent to?   [facility] - [place], [place] - [address] [facility] [place] [address] Phone: [phone] Fax: [phone]      Patient notified that their request is being sent to the clinical staff for review and that they should receive a response within 2 business days.   Please advise at Mobile [phone] (mobile)

## 2022-06-02 NOTE — Telephone Encounter (Signed)
Requesting: oxycodone  Contract: n/a  UDS: n/a  Last Visit:05/02/22  Nex Visit:06/27/22   Will get updated UDS at next appointment

## 2022-06-05 ENCOUNTER — Other Ambulatory Visit: Payer: Self-pay | Admitting: Family Medicine

## 2022-06-05 MED ORDER — OXYCODONE HCL 10 MG PO TABS: 20.0000 mg | ORAL_TABLET | ORAL | 0 refills | Status: DC | PRN

## 2022-06-07 ENCOUNTER — Other Ambulatory Visit: Payer: Self-pay | Admitting: Family Medicine

## 2022-06-09 ENCOUNTER — Telehealth: Payer: Self-pay | Admitting: Family Medicine

## 2022-06-09 NOTE — Telephone Encounter (Signed)
Medication: fentaNYL ([place]) 75 MCG/HR  Has the patient contacted their pharmacy? No.   Preferred [facility]:   [facility] [address] [place]Phone: [phone] Fax: [phone]

## 2022-06-12 ENCOUNTER — Other Ambulatory Visit: Payer: Self-pay | Admitting: Family Medicine

## 2022-06-12 DIAGNOSIS — M549 Dorsalgia, unspecified: Secondary | ICD-10-CM

## 2022-06-12 MED ORDER — FENTANYL 75 MCG/HR TD PT72: 1.0000 | MEDICATED_PATCH | TRANSDERMAL | 0 refills | Status: DC

## 2022-06-19 ENCOUNTER — Telehealth: Payer: Self-pay | Admitting: Family Medicine

## 2022-06-19 ENCOUNTER — Other Ambulatory Visit: Payer: Self-pay | Admitting: Family Medicine

## 2022-06-19 MED ORDER — OXYCODONE HCL 10 MG PO TABS: 20.0000 mg | ORAL_TABLET | ORAL | 0 refills | Status: DC | PRN

## 2022-06-19 NOTE — Telephone Encounter (Signed)
Prescription Request  06/19/2022  Is this a "Controlled Substance" medicine? Yes  LOV: Visit date not found  What is the name of the medication or equipment?  Oxycodone HCl 10 MG TABS   Have you contacted your pharmacy to request a refill? No   Which pharmacy would you like this sent to?   [facility] - [place], [place] - [address] [facility] [place] [address] Phone: [phone] Fax: [phone]      Patient notified that their request is being sent to the clinical staff for review and that they should receive a response within 2 business days.   Please advise at Mobile [phone] (mobile)

## 2022-06-27 ENCOUNTER — Ambulatory Visit (INDEPENDENT_AMBULATORY_CARE_PROVIDER_SITE_OTHER): Payer: BLUE CROSS/BLUE SHIELD | Admitting: Family Medicine

## 2022-06-27 VITALS — BP 128/80 | HR 88 | Temp 98.0°F | Resp 16 | Ht 71.0 in | Wt 245.0 lb

## 2022-06-27 DIAGNOSIS — R739 Hyperglycemia, unspecified: Secondary | ICD-10-CM | POA: Diagnosis not present

## 2022-06-27 DIAGNOSIS — Z79899 Other long term (current) drug therapy: Secondary | ICD-10-CM

## 2022-06-27 DIAGNOSIS — Z0000 Encounter for general adult medical examination without abnormal findings: Secondary | ICD-10-CM

## 2022-06-27 DIAGNOSIS — G894 Chronic pain syndrome: Secondary | ICD-10-CM

## 2022-06-27 DIAGNOSIS — J40 Bronchitis, not specified as acute or chronic: Secondary | ICD-10-CM

## 2022-06-27 DIAGNOSIS — M05742 Rheumatoid arthritis with rheumatoid factor of left hand without organ or systems involvement: Secondary | ICD-10-CM

## 2022-06-27 DIAGNOSIS — D72829 Elevated white blood cell count, unspecified: Secondary | ICD-10-CM

## 2022-06-27 DIAGNOSIS — M05741 Rheumatoid arthritis with rheumatoid factor of right hand without organ or systems involvement: Secondary | ICD-10-CM

## 2022-06-27 DIAGNOSIS — F418 Other specified anxiety disorders: Secondary | ICD-10-CM

## 2022-06-27 DIAGNOSIS — I10 Essential (primary) hypertension: Secondary | ICD-10-CM | POA: Diagnosis not present

## 2022-06-27 DIAGNOSIS — L03119 Cellulitis of unspecified part of limb: Secondary | ICD-10-CM

## 2022-06-27 MED ORDER — MUPIROCIN 2 % EX OINT: 1.0000 | TOPICAL_OINTMENT | Freq: Every day | CUTANEOUS | 0 refills | Status: DC

## 2022-06-27 MED ORDER — DOXYCYCLINE HYCLATE 100 MG PO TABS: 100.0000 mg | ORAL_TABLET | Freq: Two times a day (BID) | ORAL | 0 refills | Status: DC

## 2022-06-27 NOTE — Progress Notes (Signed)
Subjective:   By signing my name below, I, Kellie Simmering, attest that this documentation has been prepared under the direction and in the presence of Mosie Lukes, MD., 06/27/2022.     Patient ID: Jennifer Kelly, female    DOB: [birth year], 62 y.o.   MRN: [id number]  Chief Complaint  Patient presents with   Annual Exam    Annual Exam   HPI Patient is in today for a comprehensive physical exam and follow up on chronic medical concerns. She denies CP/palpitations/SOB/HA/congestion/fever/ chills/GI or GU symptoms.  Bronchitis  Patient is recovering from bronchitis which she contracted in 04/2022 and denies fever/chills. However, she still has lingering phlegm that she has been unable to remove.  Family History In 04/2022 she lost her brother and cousin.  Myeloma Patient reports that she was diagnosed with myeloma at [facility] and consequently had a bone biopsy performed on 02/06/2022. She recently returned to [facility] on 06/08/2021 for a 8-monthpost op and it has been determined that she no longer has myeloma. 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 She also experiences bilateral hip pain and has been using Actemra to manage her rheumatoid arthritis.   Staph Infection Patient was admitted to the ED in 08/2021 due to cellulitis of the right leg and unfortunately contracted a Staph infection during her stay at the hospital. She suspects this is still lingering and is seeking a treatment for this.  Past Medical History:  Diagnosis Date   Acute bronchitis 01/05/2014   Arthritis    Chicken pox as a child   Dermatitis 09/09/2014   Ganglion cyst of left foot 09/09/2014   H/O mumps    H/O tobacco use, presenting hazards to health 10/08/2009   Qualifier: Diagnosis of  By: NLinda HedgesMD, MHeinz Knuckles Last cigarette 02/16/2016    History of chicken pox    Hyperglycemia 11/30/2015   Hypertension    Morbid obesity ([place] 08/31/2009    Qualifier: Diagnosis of  By: NLinda HedgesMD, MHeinz Knuckles BMI 34.4(Sept '13) BMI 34 (March '15)    Mumps as a child   Osteopenia 01/02/2017   Osteoporosis 11/20/2016   Psoriatic arthritis ([place]    Rheumatoid arthritis ([place]    Shingles 2014    Past Surgical History:  Procedure Laterality Date   BACK SURGERY  2003   NECK SURGERY  02/17/2016   C4, C5 & C6   rotater cuff  20 yrs ago   left shoulder   TOOTH EXTRACTION  05/2012   WISDOM TOOTH EXTRACTION  62yrs old    Family History  Problem Relation Age of Onset   Parkinson's disease Mother    Heart disease Mother    Hypertension Mother    Diabetes Father        type 2   Hypertension Father    Stroke Father    Vascular Disease Brother    Hypertension Brother    Diabetes Brother    Heart attack Paternal Uncle     Social History   Socioeconomic History   Marital status: Divorced    Spouse name: Not on file   Number of children: 0   Years of education: Not on file   Highest education level: Not on file  Occupational History   Occupation: help desk coordinator  Tobacco Use   Smoking status: Former    Types: Cigarettes    Start date: 11/30/2015  Smokeless tobacco: Never  Substance and Sexual Activity   Alcohol use: Yes    Comment: occ wine   Drug use: No   Sexual activity: Never    Comment: no dietary restrictions. lives alone  Other Topics Concern   Not on file  Social History Narrative   Not on file   Social Determinants of Health   Financial Resource Strain: Not on file  Food Insecurity: Not on file  Transportation Needs: Not on file  Physical Activity: Not on file  Stress: Not on file  Social Connections: Not on file  Intimate Partner Violence: Not on file    Outpatient Medications Prior to Visit  Medication Sig Dispense Refill   ACTEMRA ACTPEN 162 MG/0.9ML SOAJ Inject into the skin.     albuterol (VENTOLIN HFA) 108 (90 Base) MCG/ACT inhaler INHALE 2 PUFFS INTO LUNGS EVERY 6 HOURS AS NEEDED FOR  WHEEZE/SHORTNESS OF BREATH 18 each 4   citalopram (CELEXA) 20 MG tablet TAKE 1 TABLET(20 MG) BY MOUTH DAILY 90 tablet 1   diltiazem (TIADYLT ER) 240 MG 24 hr capsule Take 1 capsule (240 mg total) by mouth daily. 90 capsule 1   fentaNYL (DURAGESIC) 75 MCG/HR Place 1 patch onto the skin every 3 (three) days. 10 patch 0   hydrochlorothiazide (MICROZIDE) 12.5 MG capsule TAKE 1 CAPSULE(12.5 MG) BY MOUTH DAILY 90 capsule 1   losartan (COZAAR) 50 MG tablet TAKE 1 TABLET(50 MG) BY MOUTH DAILY 90 tablet 1   naloxone (NARCAN) nasal spray 4 mg/0.1 mL I dose sprayed in one nostril every 2-3 minutes until patient is responsive, awake or EMS arrives. 1 each 2   Oxycodone HCl 10 MG TABS Take 2 tablets (20 mg total) by mouth every 4 (four) hours as needed. 180 tablet 0   venlafaxine XR (EFFEXOR-XR) 75 MG 24 hr capsule TAKE 1 CAPSULE BY MOUTH EVERY DAY 90 capsule 1   doxycycline (VIBRA-TABS) 100 MG tablet Take 100 mg by mouth 2 (two) times daily.     gabapentin (NEURONTIN) 600 MG tablet Take 1 tablet (600 mg total) by mouth 3 (three) times daily. 270 tablet 1   No facility-administered medications prior to visit.    Allergies  Allergen Reactions   Simponi [Golimumab] Rash    States that it was systemic   Erythromycin     Upset stomach    Review of Systems  Constitutional:  Negative for chills and fever.  HENT:  Negative for congestion.   Respiratory:  Negative for shortness of breath.   Cardiovascular:  Negative for chest pain and palpitations.  Gastrointestinal:  Negative for abdominal pain, blood in stool, constipation, diarrhea, nausea and vomiting.  Genitourinary:  Negative for dysuria, frequency, hematuria and urgency.  Skin:           Neurological:  Negative for headaches.       Objective:    Physical Exam Constitutional:      General: She is not in acute distress.    Appearance: Normal appearance. She is normal weight. She is not ill-appearing.  HENT:     Head: Normocephalic and  atraumatic.     Right Ear: Tympanic membrane, ear canal and external ear normal.     Left Ear: Tympanic membrane, ear canal and external ear normal.     Nose: Nose normal.     Mouth/Throat:     Mouth: Mucous membranes are moist.     Pharynx: Oropharynx is clear.  Eyes:     General:  Right eye: No discharge.        Left eye: No discharge.     Extraocular Movements: Extraocular movements intact.     Right eye: No nystagmus.     Left eye: No nystagmus.     Pupils: Pupils are equal, round, and reactive to light.  Neck:     Vascular: No carotid bruit.  Cardiovascular:     Rate and Rhythm: Normal rate and regular rhythm.     Pulses: Normal pulses.     Heart sounds: Normal heart sounds. No murmur heard.    No gallop.  Pulmonary:     Effort: Pulmonary effort is normal. No respiratory distress.     Breath sounds: Decreased air movement present. Examination of the right-middle field reveals rhonchi and rales. Examination of the left-middle field reveals rhonchi and rales. Decreased breath sounds, rhonchi and rales present. No wheezing.  Abdominal:     General: Bowel sounds are normal.     Palpations: Abdomen is soft.     Tenderness: There is no abdominal tenderness. There is no guarding.  Musculoskeletal:        General: Normal range of motion.     Cervical back: Normal range of motion.     Right lower leg: No edema.     Left lower leg: No edema.     Comments: Muscle strength 5/5 on upper and lower extremities.   Lymphadenopathy:     Cervical: No cervical adenopathy.  Skin:    General: Skin is warm and dry.  Neurological:     Mental Status: She is alert and oriented to person, place, and time.     Sensory: Sensation is intact.     Motor: Motor function is intact.     Coordination: Coordination is intact.     Deep Tendon Reflexes:     Reflex Scores:      Patellar reflexes are 2+ on the right side and 2+ on the left side. Psychiatric:        Mood and Affect: Mood normal.         Behavior: Behavior normal.        Judgment: Judgment normal.     BP 128/80 (BP Location: Right Arm, Patient Position: Sitting, Cuff Size: Normal)   Pulse 88   Temp 98 F (36.7 C) (Oral)   Resp 16   Ht 5' 11"$  (1.803 m)   Wt 245 lb (111.1 kg)   SpO2 95%   BMI 34.17 kg/m  Wt Readings from Last 3 Encounters:  06/27/22 245 lb (111.1 kg)  08/16/21 235 lb (106.6 kg)  02/28/19 239 lb (108.4 kg)    Diabetic Foot Exam - Simple   No data filed    Lab Results  Component Value Date   WBC 10.3 06/27/2022   HGB 14.1 06/27/2022   HCT 41.9 06/27/2022   PLT 223.0 06/27/2022   GLUCOSE 103 (H) 06/27/2022   CHOL 172 06/27/2022   TRIG 76.0 06/27/2022   HDL 68.90 06/27/2022   LDLCALC 88 06/27/2022   ALT 19 06/27/2022   AST 16 06/27/2022   NA 138 06/27/2022   K 3.8 06/27/2022   CL 97 06/27/2022   CREATININE 0.60 06/27/2022   BUN 10 06/27/2022   CO2 31 06/27/2022   TSH 1.55 06/27/2022   HGBA1C 5.4 10/15/2019   HGBA1C 5.4 10/15/2019    Lab Results  Component Value Date   TSH 1.55 06/27/2022   Lab Results  Component Value Date   WBC 10.3  06/27/2022   HGB 14.1 06/27/2022   HCT 41.9 06/27/2022   MCV 91.1 06/27/2022   PLT 223.0 06/27/2022   Lab Results  Component Value Date   NA 138 06/27/2022   K 3.8 06/27/2022   CO2 31 06/27/2022   GLUCOSE 103 (H) 06/27/2022   BUN 10 06/27/2022   CREATININE 0.60 06/27/2022   BILITOT 0.7 06/27/2022   ALKPHOS 90 06/27/2022   AST 16 06/27/2022   ALT 19 06/27/2022   PROT 7.0 06/27/2022   ALBUMIN 4.0 06/27/2022   CALCIUM 9.0 06/27/2022   ANIONGAP 8 11/26/2014   GFR 95.96 06/27/2022   Lab Results  Component Value Date   CHOL 172 06/27/2022   Lab Results  Component Value Date   HDL 68.90 06/27/2022   Lab Results  Component Value Date   LDLCALC 88 06/27/2022   Lab Results  Component Value Date   TRIG 76.0 06/27/2022   Lab Results  Component Value Date   CHOLHDL 2 06/27/2022   Lab Results  Component Value Date    HGBA1C 5.4 10/15/2019   HGBA1C 5.4 10/15/2019      Assessment & Plan:  Colonoscopy: Due.  DEXA: Last completed on 09/20/2016. Patient is diagnosed with osteopenia. Repeat due.  Mammogram: Last completed on 07/01/2015 with no mammographic evidence of malignancy. Repeat due.  Immunizations: Reviewed patient's immunization history. Encouraged Influenza and Shingles immunizations.  Bronchitis: Recommended Mucinex D bid to remove phlegm and prescribed Doxycycline 100 mg.  Staph Infection: Mupirocin ointment 2% has been prescribed today.   Problem List Items Addressed This Visit     Bronchitis    Encouraged increased rest and hydration, add probiotics, zinc such as Coldeze or Xicam. Treat fevers as needed. Start Doxycycline and Mucinex bid.       Chronic pain syndrome    Encouraged moist heat and gentle stretching as tolerated. May try NSAIDs and prescription meds as directed and report if symptoms worsen or seek immediate care. Continues to do well on stable doses of pain meds. She is working and managing her pain well on a daily basis.       Depression with anxiety    Has lost several people close to her in the past seven months including family members and is struggling with her grief but Citalopram feels adequate. No changes      Hyperglycemia    hgba1c acceptable, minimize simple carbs. Increase exercise as tolerated.       Relevant Orders   Lipid panel (Completed)   Hypertension    Well controlled, no changes to meds. Encouraged heart healthy diet such as the DASH diet and exercise as tolerated.        Relevant Orders   CBC with Differential/Platelet (Completed)   Comprehensive metabolic panel (Completed)   Lipid panel (Completed)   TSH (Completed)   Leukocytosis   Relevant Orders   CBC with Differential/Platelet (Completed)   Morbid obesity ([place])    Encouraged DASH or MIND diet, decrease po intake and increase exercise as tolerated. Needs 7-8 hours of sleep nightly.  Avoid trans fats, eat small, frequent meals every 4-5 hours with lean proteins, complex carbs and healthy fats. Minimize simple carbs, high fat foods and processed foods       Preventative health care    Patient encouraged to maintain heart healthy diet, regular exercise, adequate sleep. Consider daily probiotics. Take medications as prescribed. Labs ordered and reviewed. Given and reviewed copy of ACP documents from [employer] and encouraged  to complete and return  Colonoscopy: Due.  DEXA: Last completed on 09/20/2016. Patient is diagnosed with osteopenia. Repeat due.  Mammogram: Last completed on 07/01/2015 with no mammographic evidence of malignancy. Repeat due.  Immunizations: Reviewed patient's immunization history. Encouraged Influenza and Shingles immunizations.       Recurrent cellulitis of lower leg    Has resolved after lengthy treatments      Rheumatoid arthritis ([place])    Continues with rheumatology      Other Visit Diagnoses     High risk medication use    -  Primary   Relevant Orders   Drug Monitoring Panel [phone] , Urine (Completed)      Meds ordered this encounter  Medications   mupirocin ointment (BACTROBAN) 2 %    Sig: Place 1 Application into the nose daily. Via qtip    Dispense:  22 g    Refill:  0   doxycycline (VIBRA-TABS) 100 MG tablet    Sig: Take 1 tablet (100 mg total) by mouth 2 (two) times daily.    Dispense:  20 tablet    Refill:  0   I, Penni Homans, MD, personally preformed the services described in this documentation.  All medical record entries made by the scribe were at my direction and in my presence.  I have reviewed the chart and discharge instructions (if applicable) and agree that the record reflects my personal performance and is accurate and complete. 06/27/2022  I,Mohammed Iqbal,acting as a scribe for Penni Homans, MD.,have documented all relevant documentation on the behalf of Penni Homans, MD,as directed by  Penni Homans, MD while in the presence of Penni Homans, MD.  Penni Homans, MD

## 2022-06-28 LAB — COMPREHENSIVE METABOLIC PANEL
ALT: 19 U/L (ref 0–35)
AST: 16 U/L (ref 0–37)
Albumin: 4 g/dL (ref 3.5–5.2)
Alkaline Phosphatase: 90 U/L (ref 39–117)
BUN: 10 mg/dL (ref 6–23)
CO2: 31 mEq/L (ref 19–32)
Calcium: 9 mg/dL (ref 8.4–10.5)
Chloride: 97 mEq/L (ref 96–112)
Creatinine, Ser: 0.6 mg/dL (ref 0.40–1.20)
GFR: 95.96 mL/min (ref >60.00)
Glucose, Bld: 103 mg/dL — ABNORMAL HIGH (ref 70–99)
Potassium: 3.8 mEq/L (ref 3.5–5.1)
Sodium: 138 mEq/L (ref 135–145)
Total Bilirubin: 0.7 mg/dL (ref 0.2–1.2)
Total Protein: 7 g/dL (ref 6.0–8.3)

## 2022-06-28 LAB — CBC WITH DIFFERENTIAL/PLATELET
Basophils Absolute: 0.1 10*3/uL (ref 0.0–0.1)
Basophils Relative: 0.8 % (ref 0.0–3.0)
Eosinophils Absolute: 0.1 10*3/uL (ref 0.0–0.7)
Eosinophils Relative: 1.3 % (ref 0.0–5.0)
HCT: 41.9 % (ref 36.0–46.0)
Hemoglobin: 14.1 g/dL (ref 12.0–15.0)
Lymphocytes Relative: 22.3 % (ref 12.0–46.0)
Lymphs Abs: 2.3 10*3/uL (ref 0.7–4.0)
MCHC: 33.7 g/dL (ref 30.0–36.0)
MCV: 91.1 fl (ref 78.0–100.0)
Monocytes Absolute: 1 10*3/uL (ref 0.1–1.0)
Monocytes Relative: 10 % (ref 3.0–12.0)
Neutro Abs: 6.7 10*3/uL (ref 1.4–7.7)
Neutrophils Relative %: 65.6 % (ref 43.0–77.0)
Platelets: 223 10*3/uL (ref 150.0–400.0)
RBC: 4.6 Mil/uL (ref 3.87–5.11)
RDW: 14.1 % (ref 11.5–15.5)
WBC: 10.3 10*3/uL (ref 4.0–10.5)

## 2022-06-28 LAB — TSH: TSH: 1.55 u[IU]/mL (ref 0.35–5.50)

## 2022-06-28 LAB — LIPID PANEL
Cholesterol: 172 mg/dL (ref 0–200)
HDL: 68.9 mg/dL (ref >39.00)
LDL Cholesterol: 88 mg/dL (ref 0–99)
NonHDL: 103.08
Total CHOL/HDL Ratio: 2
Triglycerides: 76 mg/dL (ref 0.0–149.0)
VLDL: 15.2 mg/dL (ref 0.0–40.0)

## 2022-06-29 LAB — DRUG MONITORING PANEL 376104, URINE
Amphetamines: NEGATIVE ng/mL (ref <500)
Barbiturates: NEGATIVE ng/mL (ref <300)
Benzodiazepines: NEGATIVE ng/mL (ref <100)
Cocaine Metabolite: NEGATIVE ng/mL (ref <150)
Codeine: NEGATIVE ng/mL (ref <50)
Desmethyltramadol: NEGATIVE ng/mL (ref <100)
Hydrocodone: NEGATIVE ng/mL (ref <50)
Hydromorphone: NEGATIVE ng/mL (ref <50)
Morphine: NEGATIVE ng/mL (ref <50)
Norhydrocodone: NEGATIVE ng/mL (ref <50)
Noroxycodone: 8577 ng/mL — ABNORMAL HIGH (ref <50)
Opiates: NEGATIVE ng/mL (ref <100)
Oxycodone: >10000 ng/mL — ABNORMAL HIGH (ref <50)
Oxycodone: POSITIVE ng/mL — AB (ref <100)
Oxymorphone: 9053 ng/mL — ABNORMAL HIGH (ref <50)
Tramadol: NEGATIVE ng/mL (ref <100)

## 2022-06-29 LAB — DM TEMPLATE

## 2022-07-02 NOTE — Assessment & Plan Note (Signed)
Encouraged DASH or MIND diet, decrease po intake and increase exercise as tolerated. Needs 7-8 hours of sleep nightly. Avoid trans fats, eat small, frequent meals every 4-5 hours with lean proteins, complex carbs and healthy fats. Minimize simple carbs, high fat foods and processed foods 

## 2022-07-02 NOTE — Assessment & Plan Note (Signed)
Continues with rheumatology

## 2022-07-02 NOTE — Assessment & Plan Note (Addendum)
Patient encouraged to maintain heart healthy diet, regular exercise, adequate sleep. Consider daily probiotics. Take medications as prescribed. Labs ordered and reviewed. Given and reviewed copy of ACP documents from [place] [employer] and encouraged to complete and return  Colonoscopy: Due.  DEXA: Last completed on 09/20/2016. Patient is diagnosed with osteopenia. Repeat due.  Mammogram: Last completed on 07/01/2015 with no mammographic evidence of malignancy. Repeat due.  Immunizations: Reviewed patient's immunization history. Encouraged Influenza and Shingles immunizations.

## 2022-07-02 NOTE — Assessment & Plan Note (Signed)
Encouraged moist heat and gentle stretching as tolerated. May try NSAIDs and prescription meds as directed and report if symptoms worsen or seek immediate care. Continues to do well on stable doses of pain meds. She is working and managing her pain well on a daily basis.

## 2022-07-02 NOTE — Assessment & Plan Note (Signed)
hgba1c acceptable, minimize simple carbs. Increase exercise as tolerated.  

## 2022-07-02 NOTE — Assessment & Plan Note (Signed)
Well controlled, no changes to meds. Encouraged heart healthy diet such as the DASH diet and exercise as tolerated.  

## 2022-07-02 NOTE — Assessment & Plan Note (Signed)
Has lost several people close to her in the past seven months including family members and is struggling with her grief but Citalopram feels adequate. No changes

## 2022-07-02 NOTE — Assessment & Plan Note (Signed)
Has resolved after lengthy treatments

## 2022-07-02 NOTE — Assessment & Plan Note (Signed)
Encouraged increased rest and hydration, add probiotics, zinc such as Coldeze or Xicam. Treat fevers as needed. Start Doxycycline and Mucinex bid.

## 2022-07-03 ENCOUNTER — Other Ambulatory Visit: Payer: Self-pay

## 2022-07-05 ENCOUNTER — Other Ambulatory Visit: Payer: Self-pay | Admitting: Family Medicine

## 2022-07-05 MED ORDER — OXYCODONE HCL 10 MG PO TABS: 20.0000 mg | ORAL_TABLET | ORAL | 0 refills | Status: DC | PRN

## 2022-07-05 NOTE — Telephone Encounter (Signed)
Prescription Request  07/05/2022  Is this a "Controlled Substance" medicine? Yes  LOV: 06/27/2022  What is the name of the medication or equipment?   Oxycodone HCl 10 MG TABS [id number]   Have you contacted your pharmacy to request a refill? No   Which pharmacy would you like this sent to?   [facility] # [id number] - [place], [place] - [id number] [facility] [place] [address] Phone: [phone] Fax: [phone]   Patient notified that their request is being sent to the clinical staff for review and that they should receive a response within 2 business days.   Please advise at Mobile [phone] (mobile)

## 2022-07-05 NOTE — Telephone Encounter (Signed)
Requesting: oxycodone 20m Contract: 06/27/22 UDS: 06/27/22 Last Visit: 06/27/22 Next Visit: None Last Refill: 06/19/22 #180 and 0RF (too early?)  Please Advise

## 2022-07-20 ENCOUNTER — Telehealth: Payer: Self-pay | Admitting: Family Medicine

## 2022-07-20 NOTE — Telephone Encounter (Signed)
Prescription Request  07/20/2022  Is this a "Controlled Substance" medicine? Yes  LOV: 06/27/2022  What is the name of the medication or equipment?   Oxycodone HCl 10 MG TABS [id number]   Have you contacted your pharmacy to request a refill? No   Which pharmacy would you like this sent to?   [facility] # [id number] - [place], [place] - [id number] [facility] [place] [address] Phone: [phone] Fax: [phone]   Patient notified that their request is being sent to the clinical staff for review and that they should receive a response within 2 business days.   Please advise at Mobile [phone] (mobile)

## 2022-07-23 ENCOUNTER — Other Ambulatory Visit: Payer: Self-pay | Admitting: Family Medicine

## 2022-07-23 MED ORDER — OXYCODONE HCL 10 MG PO TABS: 20.0000 mg | ORAL_TABLET | ORAL | 0 refills | Status: DC | PRN

## 2022-07-24 ENCOUNTER — Other Ambulatory Visit: Payer: Self-pay | Admitting: Family Medicine

## 2022-07-25 ENCOUNTER — Other Ambulatory Visit: Payer: Self-pay | Admitting: Family Medicine

## 2022-07-25 NOTE — Progress Notes (Signed)
Called pt lvm we have tried to send Oxycodone  [facility] doesn't accept E rx's for controlled substances. Dr.Blyth has tried and another provider Has tried. We can mail it or she can come pick up.

## 2022-07-26 ENCOUNTER — Telehealth: Payer: Self-pay | Admitting: Family Medicine

## 2022-07-26 NOTE — Telephone Encounter (Signed)
Pt returned CMA's call and would like a call back as soon as possible regarding her prescriptions. She said she thinks something will have to be mailed. Please call her to discuss

## 2022-07-27 ENCOUNTER — Other Ambulatory Visit: Payer: Self-pay | Admitting: Family Medicine

## 2022-07-27 MED ORDER — OXYCODONE HCL 10 MG PO TABS: 20.0000 mg | ORAL_TABLET | ORAL | 0 refills | Status: DC | PRN

## 2022-07-27 NOTE — Telephone Encounter (Signed)
Pt called back to follow up to call yesterday. She said it's too confusing to explain so would like a call as soon as he can. Pt will try to send mychart message.

## 2022-07-27 NOTE — Telephone Encounter (Signed)
Called pt back to discuss medication and Pt ask for Dr.Blyth to  Try resend to pharmacy. It was sent in.

## 2022-08-07 ENCOUNTER — Telehealth: Payer: Self-pay | Admitting: Family Medicine

## 2022-08-07 NOTE — Telephone Encounter (Signed)
Prescription Request  08/07/2022  Is this a "Controlled Substance" medicine? Yes  LOV: 06/27/2022  What is the name of the medication or equipment?   fentaNYL ([place]) 75 MCG/HR [id number]   Have you contacted your pharmacy to request a refill? No   Which pharmacy would you like this sent to?   [id number] [place], [place] [facility] [place] [address] Phone: [phone] Fax: [phone]    Patient notified that their request is being sent to the clinical staff for review and that they should receive a response within 2 business days.   Please advise at Mobile [phone] (mobile)

## 2022-08-08 ENCOUNTER — Other Ambulatory Visit: Payer: Self-pay | Admitting: Family Medicine

## 2022-08-08 DIAGNOSIS — M549 Dorsalgia, unspecified: Secondary | ICD-10-CM

## 2022-08-08 MED ORDER — FENTANYL 75 MCG/HR TD PT72: 1.0000 | MEDICATED_PATCH | TRANSDERMAL | 0 refills | Status: AC

## 2022-08-10 ENCOUNTER — Telehealth: Payer: Self-pay | Admitting: Family Medicine

## 2022-08-10 NOTE — Telephone Encounter (Signed)
Prescription Request  08/10/2022  Is this a "Controlled Substance" medicine? Yes  LOV: 06/27/2022  What is the name of the medication or equipment?   Oxycodone HCl 10 MG TABS   Have you contacted your pharmacy to request a refill? No   Which pharmacy would you like this sent to?  [facility] # [id number] - [place],  - [id number] [facility] [place] [address] Phone: [phone] Fax: [phone]    Patient notified that their request is being sent to the clinical staff for review and that they should receive a response within 2 business days.   Please advise at [facility] [phone]

## 2022-08-11 ENCOUNTER — Other Ambulatory Visit: Payer: Self-pay | Admitting: Family Medicine

## 2022-08-11 MED ORDER — OXYCODONE HCL 10 MG PO TABS: 20.0000 mg | ORAL_TABLET | ORAL | 0 refills | Status: DC | PRN

## 2022-08-24 ENCOUNTER — Telehealth: Payer: Self-pay | Admitting: Family Medicine

## 2022-08-24 NOTE — Telephone Encounter (Signed)
Prescription Request  08/24/2022  Is this a "Controlled Substance" medicine? No  LOV: 06/27/2022  What is the name of the medication or equipment?  Oxycodone HCl 10 MG TABS   Have you contacted your pharmacy to request a refill? No   Which pharmacy would you like this sent to?  [facility] # [id number] - [place], [place] - [id number] [facility] [place] [address] Phone: [phone] Fax: [phone]    Patient notified that their request is being sent to the clinical staff for review and that they should receive a response within 2 business days.   Please advise at Mobile [phone] (mobile)

## 2022-08-25 ENCOUNTER — Other Ambulatory Visit: Payer: Self-pay | Admitting: Family Medicine

## 2022-08-25 MED ORDER — OXYCODONE HCL 10 MG PO TABS: 20.0000 mg | ORAL_TABLET | ORAL | 0 refills | Status: DC | PRN

## 2022-09-05 ENCOUNTER — Other Ambulatory Visit: Payer: Self-pay | Admitting: Family Medicine

## 2022-09-06 ENCOUNTER — Other Ambulatory Visit: Payer: Self-pay | Admitting: Family Medicine

## 2022-09-06 DIAGNOSIS — I10 Essential (primary) hypertension: Secondary | ICD-10-CM

## 2022-09-07 ENCOUNTER — Telehealth: Payer: Self-pay | Admitting: Family Medicine

## 2022-09-07 ENCOUNTER — Other Ambulatory Visit: Payer: Self-pay | Admitting: Family Medicine

## 2022-09-07 MED ORDER — OXYCODONE HCL 10 MG PO TABS: 20.0000 mg | ORAL_TABLET | ORAL | 0 refills | Status: DC | PRN

## 2022-09-07 NOTE — Telephone Encounter (Signed)
Prescription Request  09/07/2022  Is this a "Controlled Substance" medicine? Yes  LOV: 06/27/2022  What is the name of the medication or equipment?  Oxycodone HCl 10 MG TABS [[id number]]   Have you contacted your pharmacy to request a refill? No   Which pharmacy would you like this sent to?  [id number] - [place], [place] - [address] [facility] [place] [address] Phone: [phone] Fax: [phone]     Patient notified that their request is being sent to the clinical staff for review and that they should receive a response within 2 business days.   Please advise at Mobile [phone] (mobile)

## 2022-09-21 ENCOUNTER — Telehealth: Payer: Self-pay | Admitting: Family Medicine

## 2022-09-21 ENCOUNTER — Other Ambulatory Visit: Payer: Self-pay | Admitting: Family Medicine

## 2022-09-21 MED ORDER — OXYCODONE HCL 10 MG PO TABS: 20.0000 mg | ORAL_TABLET | ORAL | 0 refills | Status: DC | PRN

## 2022-09-21 NOTE — Telephone Encounter (Signed)
Pt stated the pharmacy short filled this medication due to limited supply and this is why she is calling for a refill.  Prescription Request  09/21/2022  Is this a "Controlled Substance" medicine? Yes  LOV: 06/27/2022  What is the name of the medication or equipment?   Oxycodone HCl 10 MG TABS [[id number]]   Have you contacted your pharmacy to request a refill? No   Which pharmacy would you like this sent to?   [id number] - [place], [place] - [address] [facility] [place] [address] Phone: [phone] Fax: [phone]   Patient notified that their request is being sent to the clinical staff for review and that they should receive a response within 2 business days.   Please advise at Mobile [phone] (mobile)

## 2022-09-22 NOTE — Telephone Encounter (Signed)
Called spoke with pharmacy and with pt, they stated she had only received 120.  Pharmacy said they had the medication filled for 180 Oxycodone 10 mg ready for pickup and pt was advised it was there. Pt stated she understand.

## 2022-10-05 ENCOUNTER — Telehealth: Payer: Self-pay | Admitting: Family Medicine

## 2022-10-05 NOTE — Telephone Encounter (Signed)
Prescription Request  10/05/2022  Is this a "Controlled Substance" medicine? Yes  LOV: 06/27/2022  What is the name of the medication or equipment?  Oxycodone HCl 10 MG TABS    Have you contacted your pharmacy to request a refill? No   Which pharmacy would you like this sent to?  [id number] - [place], [place] - [address] [facility] [place] [address] Phone: [phone] Fax: [phone]    Patient notified that their request is being sent to the clinical staff for review and that they should receive a response within 2 business days.   Please advise at Mobile [phone] (mobile)

## 2022-10-06 ENCOUNTER — Other Ambulatory Visit: Payer: Self-pay | Admitting: Family Medicine

## 2022-10-06 MED ORDER — OXYCODONE HCL 10 MG PO TABS: 20.0000 mg | ORAL_TABLET | ORAL | 0 refills | Status: DC | PRN

## 2022-10-18 ENCOUNTER — Other Ambulatory Visit: Payer: Self-pay | Admitting: Family Medicine

## 2022-10-19 ENCOUNTER — Other Ambulatory Visit: Payer: Self-pay | Admitting: Family Medicine

## 2022-10-19 ENCOUNTER — Telehealth: Payer: Self-pay | Admitting: Family Medicine

## 2022-10-19 MED ORDER — OXYCODONE HCL 10 MG PO TABS: 20.0000 mg | ORAL_TABLET | ORAL | 0 refills | Status: DC | PRN

## 2022-10-19 NOTE — Telephone Encounter (Signed)
Prescription Request  10/19/2022  Is this a "Controlled Substance" medicine? Yes  LOV: 06/27/2022  What is the name of the medication or equipment?   Oxycodone HCl 10 MG TABS [[id number]]   Have you contacted your pharmacy to request a refill? No   Which pharmacy would you like this sent to?   [id number] - [place], [place] - [address] [facility] [place] [address] Phone: [phone] Fax: [phone]  Patient notified that their request is being sent to the clinical staff for review and that they should receive a response within 2 business days.   Please advise at Mobile [phone] (mobile)

## 2022-10-28 ENCOUNTER — Other Ambulatory Visit: Payer: Self-pay | Admitting: Family Medicine

## 2022-10-28 DIAGNOSIS — I10 Essential (primary) hypertension: Secondary | ICD-10-CM

## 2022-11-02 ENCOUNTER — Telehealth: Payer: Self-pay | Admitting: Family Medicine

## 2022-11-02 NOTE — Telephone Encounter (Signed)
Prescription Request  11/02/2022  Is this a "Controlled Substance" medicine? Yes  LOV: 06/27/2022  What is the name of the medication or equipment?  Oxycodone HCl 10 MG TABS [[id number]]   Have you contacted your pharmacy to request a refill? No   Which pharmacy would you like this sent to?  [id number] - [place],  - [address] [facility] [place] [address] Phone: [phone] Fax: [phone]    Patient notified that their request is being sent to the clinical staff for review and that they should receive a response within 2 business days.   Please advise at Mobile [phone] (mobile)

## 2022-11-03 MED ORDER — OXYCODONE HCL 10 MG PO TABS: 20.0000 mg | ORAL_TABLET | ORAL | 0 refills | Status: DC | PRN

## 2022-11-03 NOTE — Addendum Note (Signed)
Addended byConrad Loco D on: 11/03/2022 08:29 AM   Modules accepted: Orders

## 2022-11-03 NOTE — Telephone Encounter (Signed)
Requesting:oxycodone 10mg   Contract: 07/05/2017 UDS: 06/27/22  Last Visit: 06/27/22 Next Visit: 01/04/23 Last Refill: 10/19/22 #180 and 0RF  Please Advise

## 2022-11-03 NOTE — Telephone Encounter (Signed)
PDMP reviewed, medication is refilled regularly by PCP.  Prescription sent

## 2022-11-03 NOTE — Addendum Note (Signed)
Addended by: Willow Ora E on: 11/03/2022 10:16 AM   Modules accepted: Orders

## 2022-11-14 ENCOUNTER — Telehealth: Payer: Self-pay | Admitting: Family Medicine

## 2022-11-14 NOTE — Telephone Encounter (Signed)
Prescription Request  11/14/2022  Is this a "Controlled Substance" medicine? Yes  LOV: 06/27/2022  What is the name of the medication or equipment?   Oxycodone HCl 10 MG TABS [[id number]]   Have you contacted your pharmacy to request a refill? No   Which pharmacy would you like this sent to?  [id number] - [place], [place] - [address] [facility] [place] [address] Phone: [phone] Fax: [phone]    Patient notified that their request is being sent to the clinical staff for review and that they should receive a response within 2 business days.   Please advise at Mobile [phone] (mobile)

## 2022-11-15 ENCOUNTER — Other Ambulatory Visit: Payer: Self-pay | Admitting: Family Medicine

## 2022-11-15 ENCOUNTER — Other Ambulatory Visit: Payer: Self-pay | Admitting: Internal Medicine

## 2022-11-15 MED ORDER — OXYCODONE HCL 10 MG PO TABS: 20.0000 mg | ORAL_TABLET | ORAL | 0 refills | Status: DC | PRN

## 2022-11-15 NOTE — Telephone Encounter (Signed)
Pt called to follow up on call from yesterday regarding refill. Patient was advised that message was sent to provider but she requested another message to be sent back

## 2022-11-30 ENCOUNTER — Telehealth: Payer: Self-pay | Admitting: Family Medicine

## 2022-11-30 ENCOUNTER — Other Ambulatory Visit: Payer: Self-pay | Admitting: Family Medicine

## 2022-11-30 MED ORDER — OXYCODONE HCL 10 MG PO TABS: 20.0000 mg | ORAL_TABLET | ORAL | 0 refills | Status: DC | PRN

## 2022-11-30 NOTE — Telephone Encounter (Signed)
Patient called and needing a med refill on Oxycodone HCl 10 MG TABS   Please send to [facility] in [place]

## 2022-12-02 ENCOUNTER — Other Ambulatory Visit: Payer: Self-pay | Admitting: Family Medicine

## 2022-12-14 ENCOUNTER — Telehealth: Payer: Self-pay | Admitting: Family Medicine

## 2022-12-14 NOTE — Telephone Encounter (Signed)
Prescription Request  12/14/2022  Is this a "Controlled Substance" medicine? Yes  LOV: 06/27/2022  What is the name of the medication or equipment?  Oxycodone HCl 10 MG TABS [[id number]]   Have you contacted your pharmacy to request a refill? No   Which pharmacy would you like this sent to?  [id number] - [place], [place] - [address] [facility] [place] [address] Phone: [phone] Fax: [phone]  Patient notified that their request is being sent to the clinical staff for review and that they should receive a response within 2 business days.   Please advise at Mobile [phone] (mobile)

## 2022-12-15 ENCOUNTER — Other Ambulatory Visit: Payer: Self-pay | Admitting: Family Medicine

## 2022-12-15 MED ORDER — OXYCODONE HCL 10 MG PO TABS: 20.0000 mg | ORAL_TABLET | ORAL | 0 refills | Status: DC | PRN

## 2022-12-21 ENCOUNTER — Other Ambulatory Visit: Payer: Self-pay | Admitting: Family Medicine

## 2022-12-21 DIAGNOSIS — I10 Essential (primary) hypertension: Secondary | ICD-10-CM

## 2022-12-21 DIAGNOSIS — L03119 Cellulitis of unspecified part of limb: Secondary | ICD-10-CM

## 2022-12-21 DIAGNOSIS — J40 Bronchitis, not specified as acute or chronic: Secondary | ICD-10-CM

## 2022-12-29 ENCOUNTER — Other Ambulatory Visit: Payer: Self-pay | Admitting: Family Medicine

## 2022-12-29 NOTE — Addendum Note (Signed)
Addended by: Maximino Sarin on: 12/29/2022 04:26 PM   Modules accepted: Orders

## 2022-12-29 NOTE — Telephone Encounter (Signed)
Prescription Request  12/29/2022  Is this a "Controlled Substance" medicine? Yes  LOV: Visit date not found  What is the name of the medication or equipment? Oxycodone HCl 10 MG TABS  Have you contacted your pharmacy to request a refill? No   Which pharmacy would you like this sent to?   [id number] [place], [place] - [address] [place] [address] Phone: [phone] Fax: [phone]    Patient notified that their request is being sent to the clinical staff for review and that they should receive a response within 2 business days.   Please advise at [facility] [phone]

## 2022-12-29 NOTE — Telephone Encounter (Signed)
Requesting: Oxycodone 10mg  Contract: 06/27/2022 UDS: 06/27/2022 Last Visit: 06/27/2022 Next Visit: 01/09/2023 Last Refill: 12/15/2022  Please Advise

## 2022-12-31 MED ORDER — OXYCODONE HCL 10 MG PO TABS: 20.0000 mg | ORAL_TABLET | ORAL | 0 refills | Status: DC | PRN

## 2023-01-04 ENCOUNTER — Telehealth: Payer: BLUE CROSS/BLUE SHIELD | Admitting: Family Medicine

## 2023-01-09 ENCOUNTER — Encounter: Payer: Self-pay | Admitting: Family Medicine

## 2023-01-09 ENCOUNTER — Telehealth (INDEPENDENT_AMBULATORY_CARE_PROVIDER_SITE_OTHER): Payer: BLUE CROSS/BLUE SHIELD | Admitting: Family Medicine

## 2023-01-09 DIAGNOSIS — F418 Other specified anxiety disorders: Secondary | ICD-10-CM

## 2023-01-09 DIAGNOSIS — M05741 Rheumatoid arthritis with rheumatoid factor of right hand without organ or systems involvement: Secondary | ICD-10-CM

## 2023-01-09 DIAGNOSIS — M8580 Other specified disorders of bone density and structure, unspecified site: Secondary | ICD-10-CM

## 2023-01-09 DIAGNOSIS — I10 Essential (primary) hypertension: Secondary | ICD-10-CM | POA: Diagnosis not present

## 2023-01-09 DIAGNOSIS — M05742 Rheumatoid arthritis with rheumatoid factor of left hand without organ or systems involvement: Secondary | ICD-10-CM

## 2023-01-09 MED ORDER — DILTIAZEM HCL ER BEADS 240 MG PO CP24: 240.0000 mg | ORAL_CAPSULE | Freq: Every day | ORAL | 1 refills | Status: DC

## 2023-01-09 MED ORDER — LOSARTAN POTASSIUM 50 MG PO TABS
50.0000 mg | ORAL_TABLET | Freq: Every day | ORAL | 1 refills | Status: DC
Start: 2023-01-09 — End: 2023-09-12

## 2023-01-09 NOTE — Assessment & Plan Note (Signed)
Encouraged to get adequate exercise, calcium and vitamin d intake 

## 2023-01-09 NOTE — Assessment & Plan Note (Signed)
Her pain continues to worsen and she did not feel her Fentanyl was helping so she stopped it but her pain continues to escalate. She is no longer able to work and is in the process of applying for long term disability as she works with her Rheumatologists.

## 2023-01-09 NOTE — Progress Notes (Signed)
MyChart Video Visit    Virtual Visit via Video Note   This patient is at least at moderate risk for complications without adequate follow up. This format is felt to be most appropriate for this patient at this time. Physical exam was limited by quality of the video and audio technology used for the visit. Shamaine, CMA was able to get the patient set up on a video visit.  Patient location: home Patient and provider in visit Provider location: Office  I discussed the limitations of evaluation and management by telemedicine and the availability of in person appointments. The patient expressed understanding and agreed to proceed.  Visit Date: 01/09/2023  Today's healthcare provider: Danise Edge, MD     Subjective:    Patient ID: Jennifer Kelly, female    DOB: 08-11-1960, 62 y.o.   MRN: [id number]  No chief complaint on file.   HPI Discussed the use of AI scribe software for clinical note transcription with the patient, who gave verbal consent to proceed.  History of Present Illness   The patient, with a history of severe arthritis, presents with worsening arthritis symptoms. The patient reports increased pain and physical deformities, including a curling toe and lack of visible knuckles. The patient's condition has significantly impacted their quality of life, leading to difficulties in maintaining personal hygiene and keeping their living space clean. The patient also reports emotional distress, expressing feelings of frustration and sadness.    The patient is also experiencing financial difficulties and has applied for social security disability. The patient is currently on disability through their job and is paying for their own benefits. The patient is hoping to receive additional support once they are on disability.        Past Medical History:  Diagnosis Date  . Acute bronchitis 01/05/2014  . Arthritis   . Chicken pox as a child  . Dermatitis 09/09/2014  . Ganglion  cyst of left foot 09/09/2014  . H/O mumps   . H/O tobacco use, presenting hazards to health 10/08/2009   Qualifier: Diagnosis of  By: Debby Bud MD, Rosalyn Gess  Last cigarette 02/16/2016   . History of chicken pox   . Hyperglycemia 11/30/2015  . Hypertension   . Morbid obesity (HCC) 08/31/2009   Qualifier: Diagnosis of  By: Debby Bud MD, Rosalyn Gess  BMI 34.4(Sept '13) BMI 34 (March '15)   . Mumps as a child  . Osteopenia 01/02/2017  . Osteoporosis 11/20/2016  . Psoriatic arthritis (HCC)   . Rheumatoid arthritis (HCC)   . Shingles 2014    Past Surgical History:  Procedure Laterality Date  . BACK SURGERY  2003  . NECK SURGERY  02/17/2016   C4, C5 & C6  . rotater cuff  20 yrs ago   left shoulder  . TOOTH EXTRACTION  05/2012  . WISDOM TOOTH EXTRACTION  62 yrs old    Family History  Problem Relation Age of Onset  . Parkinson's disease Mother   . Heart disease Mother   . Hypertension Mother   . Diabetes Father        type 2  . Hypertension Father   . Stroke Father   . Vascular Disease Brother   . Hypertension Brother   . Diabetes Brother   . Heart attack Paternal Uncle     Social History   Socioeconomic History  . Marital status: Divorced    Spouse name: Not on file  . Number of children: 0  . Years of education:  Not on file  . Highest education level: Not on file  Occupational History  . Occupation: help Youth worker  Tobacco Use  . Smoking status: Former    Types: Cigarettes    Start date: 11/30/2015  . Smokeless tobacco: Never  Substance and Sexual Activity  . Alcohol use: Yes    Comment: occ wine  . Drug use: No  . Sexual activity: Never    Comment: no dietary restrictions. lives alone  Other Topics Concern  . Not on file  Social History Narrative  . Not on file   Social Determinants of Health   Financial Resource Strain: Not on file  Food Insecurity: Not on file  Transportation Needs: Not on file  Physical Activity: Not on file  Stress: Not on file  Social  Connections: Not on file  Intimate Partner Violence: Not on file    Outpatient Medications Prior to Visit  Medication Sig Dispense Refill  . ACTEMRA ACTPEN 162 MG/0.9ML SOAJ Inject into the skin.    Marland Kitchen albuterol (VENTOLIN HFA) 108 (90 Base) MCG/ACT inhaler Inhale 2 puffs into the lungs every 6 (six) hours as needed for wheezing or shortness of breath. 18 g 5  . citalopram (CELEXA) 20 MG tablet TAKE 1 TABLET (20MG ) BY MOUTH EVERY DAY 90 tablet 1  . fentaNYL (DURAGESIC) 75 MCG/HR Place 1 patch onto the skin every 3 (three) days. 10 patch 0  . hydrochlorothiazide (MICROZIDE) 12.5 MG capsule TAKE 1 CAPSULE (12.5MG ) BY MOUTH EVERY DAY 90 capsule 1  . mupirocin ointment (BACTROBAN) 2 % PLACE 1 APPLICATION INTO THE NOSE DAILY. VIA Q TIP 22 g 0  . naloxone (NARCAN) nasal spray 4 mg/0.1 mL I dose sprayed in one nostril every 2-3 minutes until patient is responsive, awake or EMS arrives. 1 each 2  . Oxycodone HCl 10 MG TABS Take 2 tablets (20 mg total) by mouth every 4 (four) hours as needed. 180 tablet 0  . venlafaxine XR (EFFEXOR-XR) 75 MG 24 hr capsule TAKE 1 CAPSULE BY MOUTH EVERY DAY 90 capsule 1  . diltiazem (TIADYLT ER) 240 MG 24 hr capsule Take 1 capsule (240 mg total) by mouth daily. 90 capsule 1  . losartan (COZAAR) 50 MG tablet Take 1 tablet (50 mg total) by mouth daily. 90 tablet 0   No facility-administered medications prior to visit.    Allergies  Allergen Reactions  . Simponi [Golimumab] Rash    States that it was systemic  . Erythromycin     Upset stomach    Review of Systems  Constitutional:  Positive for malaise/fatigue. Negative for fever.  HENT:  Negative for congestion.   Eyes:  Negative for blurred vision.  Respiratory:  Negative for shortness of breath.   Cardiovascular:  Negative for chest pain, palpitations and leg swelling.  Gastrointestinal:  Negative for abdominal pain, blood in stool and nausea.  Genitourinary:  Negative for dysuria and frequency.   Musculoskeletal:  Positive for back pain, joint pain, myalgias and neck pain. Negative for falls.  Skin:  Negative for rash.  Neurological:  Negative for dizziness, loss of consciousness and headaches.  Endo/Heme/Allergies:  Negative for environmental allergies.  Psychiatric/Behavioral:  Positive for depression. The patient is not nervous/anxious.       Objective:    Physical Exam Constitutional:      General: She is not in acute distress.    Appearance: Normal appearance. She is not ill-appearing or toxic-appearing.  HENT:     Head: Normocephalic and atraumatic.  Right Ear: External ear normal.     Left Ear: External ear normal.     Nose: Nose normal.  Eyes:     General:        Right eye: No discharge.        Left eye: No discharge.  Pulmonary:     Effort: Pulmonary effort is normal.  Skin:    Findings: No rash.  Neurological:     Mental Status: She is alert and oriented to person, place, and time.  Psychiatric:        Behavior: Behavior normal.   There were no vitals taken for this visit. Wt Readings from Last 3 Encounters:  06/27/22 245 lb (111.1 kg)  08/16/21 235 lb (106.6 kg)  02/28/19 239 lb (108.4 kg)       Assessment & Plan:  Rheumatoid arthritis involving both hands with positive rheumatoid factor (HCC) Assessment & Plan: Her pain continues to worsen and she did not feel her Fentanyl was helping so she stopped it but her pain continues to escalate. She is no longer able to work and is in the process of applying for long term disability as she works with her Rheumatologists.   Essential hypertension -     Losartan Potassium; Take 1 tablet (50 mg total) by mouth daily.  Dispense: 90 tablet; Refill: 1  Osteopenia, unspecified location Assessment & Plan: Encouraged to get adequate exercise, calcium and vitamin d intake   Primary hypertension Assessment & Plan: Monitor and report any concerns, no changes to meds. Encouraged heart healthy diet such as  the DASH diet and exercise as tolerated.     Depression with anxiety Assessment & Plan: She is struggling with anhedonia because she is unable to work and has daily pain but she is managing on Citalopram and Venlafaxine   Other orders -     dilTIAZem HCl ER Beads; Take 1 capsule (240 mg total) by mouth daily.  Dispense: 90 capsule; Refill: 1     Assessment and Plan    Rheumatoid Arthritis Severe disease with significant deformities and functional impairment. Considering addition of daily medication to current regimen. -Consult rheumatologist for further management and medication adjustment.  Hypertension Currently on Losartan and Diltiazem. -[facility].  Vitamin D Deficiency Currently on daily supplementation. -Continue Vitamin D daily.  Osteoarthritis Considering use of hyaluronic acid supplement. -Consider trying hyaluronic acid supplement from [employer].  Chronic Pain Previously on Oxycontin patches, considering restarting due to increased pain. -Consider restarting Oxycontin patches, with caution due to potential increased sensitivity after period of non-use.  General Health Maintenance / Followup Plans -Check in 3-6 months or as needed for further management.         I discussed the assessment and treatment plan with the patient. The patient was provided an opportunity to ask questions and all were answered. The patient agreed with the plan and demonstrated an understanding of the instructions.   The patient was advised to call back or seek an in-person evaluation if the symptoms worsen or if the condition fails to improve as anticipated.  Danise Edge, MD [facility] at [facility] [phone] (phone) [phone] (fax)  [facility]

## 2023-01-09 NOTE — Assessment & Plan Note (Signed)
She is struggling with anhedonia because she is unable to work and has daily pain but she is managing on Citalopram and Venlafaxine

## 2023-01-09 NOTE — Assessment & Plan Note (Signed)
Monitor and report any concerns, no changes to meds. Encouraged heart healthy diet such as the DASH diet and exercise as tolerated.  ?

## 2023-01-15 ENCOUNTER — Other Ambulatory Visit: Payer: Self-pay | Admitting: Family Medicine

## 2023-01-15 ENCOUNTER — Encounter: Payer: Self-pay | Admitting: Family Medicine

## 2023-01-15 MED ORDER — OXYCODONE HCL 10 MG PO TABS: 20.0000 mg | ORAL_TABLET | ORAL | 0 refills | Status: DC | PRN

## 2023-01-15 NOTE — Telephone Encounter (Signed)
Prescription Request  01/15/2023  Is this a "Controlled Substance" medicine? No  LOV: Visit date not found  What is the name of the medication or equipment? Oxycodone HCl 10 MG TABS  Have you contacted your pharmacy to request a refill? No   Which pharmacy would you like this sent to?  [id number] - [place], [place] - [address] [facility] [place] [address] Phone: [phone] Fax: [phone]  Patient notified that their request is being sent to the clinical staff for review and that they should receive a response within 2 business days.   Please advise at [facility] [phone]

## 2023-01-15 NOTE — Addendum Note (Signed)
Addended byConrad Bushnell D on: 01/15/2023 03:51 PM   Modules accepted: Orders

## 2023-01-15 NOTE — Telephone Encounter (Signed)
Requesting: oxycodone  Contract: 06/27/22 UDS: 06/27/22 Last Visit: 01/09/23 Next Visit: 05/03/23 Last Refill: 12/31/22 #180 and 0RF  Please Advise

## 2023-01-30 ENCOUNTER — Other Ambulatory Visit: Payer: Self-pay | Admitting: Family Medicine

## 2023-01-30 ENCOUNTER — Encounter: Payer: Self-pay | Admitting: Family Medicine

## 2023-01-30 ENCOUNTER — Telehealth: Payer: Self-pay | Admitting: Family Medicine

## 2023-01-30 MED ORDER — OXYCODONE HCL 10 MG PO TABS: 20.0000 mg | ORAL_TABLET | ORAL | 0 refills | Status: DC | PRN

## 2023-01-30 NOTE — Telephone Encounter (Signed)
Prescription Request  01/30/2023  Is this a "Controlled Substance" medicine? Yes  LOV: Visit date not found  What is the name of the medication or equipment? Oxycodone HCl 10 MG TABS  Have you contacted your pharmacy to request a refill? No   Which pharmacy would you like this sent to?  [id number] - , [place] - [address] [facility] [place] [address] Phone: [phone] Fax: [phone]     Patient notified that their request is being sent to the clinical staff for review and that they should receive a response within 2 business days.   Please advise at Mobile [phone] (mobile)

## 2023-02-05 ENCOUNTER — Other Ambulatory Visit: Payer: Self-pay | Admitting: Family Medicine

## 2023-02-14 ENCOUNTER — Other Ambulatory Visit: Payer: Self-pay | Admitting: Family Medicine

## 2023-02-14 ENCOUNTER — Telehealth: Payer: Self-pay | Admitting: Family Medicine

## 2023-02-14 ENCOUNTER — Other Ambulatory Visit: Payer: Self-pay | Admitting: Family

## 2023-02-14 MED ORDER — OXYCODONE HCL 10 MG PO TABS: 20.0000 mg | ORAL_TABLET | ORAL | 0 refills | Status: DC | PRN

## 2023-02-14 NOTE — Telephone Encounter (Signed)
Requesting: oxycodone 10mg  Contract: 06/27/22 UDS: 06/27/22 Last Visit: 01/09/23 Next Visit: 05/03/23 Last Refill: 01/30/23 #180 and 0RF   Please Advise

## 2023-02-14 NOTE — Telephone Encounter (Signed)
**  Pt stated that the pharmacy was short on the medication and they weren't able to give her 8 tablets in the last filling of this medication.**  Prescription Request  02/14/2023  Is this a "Controlled Substance" medicine? Yes  LOV: Visit date not found  What is the name of the medication or equipment?   Oxycodone HCl 10 MG TABS [[id number]]  Have you contacted your pharmacy to request a refill? No   Which pharmacy would you like this sent to?  [id number] - [place],  - [address] [facility] [place] [address] Phone: [phone] Fax: [phone]  Patient notified that their request is being sent to the clinical staff for review and that they should receive a response within 2 business days.   Please advise at Mobile [phone] (mobile)

## 2023-02-14 NOTE — Telephone Encounter (Signed)
Sound good thanks

## 2023-02-28 ENCOUNTER — Telehealth: Payer: Self-pay | Admitting: Family Medicine

## 2023-02-28 NOTE — Telephone Encounter (Signed)
Prescription Request  02/28/2023  Is this a "Controlled Substance" medicine? Yes  LOV: Visit date not found   What is the name of the medication or equipment?  Oxycodone HCl 10 MG TABS  Have you contacted your pharmacy to request a refill? No   Which pharmacy would you like this sent to?  [facility] - [place], [place] - [address] [facility] [place] [address] Phone: [phone] Fax: [phone]   Patient notified that their request is being sent to the clinical staff for review and that they should receive a response within 2 business days.   Please advise at Mobile [phone] (mobile)

## 2023-03-01 ENCOUNTER — Other Ambulatory Visit: Payer: Self-pay | Admitting: Family Medicine

## 2023-03-01 MED ORDER — OXYCODONE HCL 10 MG PO TABS: 20.0000 mg | ORAL_TABLET | ORAL | 0 refills | Status: DC | PRN

## 2023-03-02 ENCOUNTER — Other Ambulatory Visit: Payer: Self-pay | Admitting: Family

## 2023-03-15 ENCOUNTER — Other Ambulatory Visit: Payer: Self-pay | Admitting: Family Medicine

## 2023-03-15 MED ORDER — OXYCODONE HCL 10 MG PO TABS: 20.0000 mg | ORAL_TABLET | ORAL | 0 refills | Status: DC | PRN

## 2023-03-15 NOTE — Telephone Encounter (Signed)
Requesting: oxycodone 10mg  Contract: 06/27/22 UDS: 06/27/22 Last Visit: 01/09/23 Next Visit: 05/03/23 Last Refill: 03/01/23 #180 and 0RF   Please Advise

## 2023-03-16 ENCOUNTER — Telehealth: Payer: Self-pay | Admitting: Family Medicine

## 2023-03-16 NOTE — Telephone Encounter (Signed)
Brett Canales from [facility] stated he needs to go over some questions with a nurse regarding pt's oxycodone. States he is needing an updated diagnosis, ov notes/drug screen, and poc as in if there is a plan to help pt use other methods of treatment other than rx. Stated he was not able to fax this request and would like a call back.

## 2023-03-20 NOTE — Telephone Encounter (Signed)
Spoke with patient regarding pain and pain control.  Patient states her medications have not changed. She is continuing to take Oxycodone 20mg  q4hrs prn pain.  She does have fentanyl duragesic patches, but has not applied one since March 2024.   Patient reports inability to complete physical therapy d/t RA pain. Has had injections in hips but only helped minimally.  She denies other plans of treatment for pain.   Patient states [facility] did refill her pain medications, they are needing the information PCP sent to them a year ago to keep on file.   Patient has f/u appt with PCP in December 2024 (virtual).

## 2023-03-22 NOTE — Telephone Encounter (Signed)
Spoke with [facility] pharmacist. Diagnoses, plan and UDS date provided.  Advised to call with further questions.

## 2023-03-29 ENCOUNTER — Other Ambulatory Visit: Payer: Self-pay | Admitting: Family Medicine

## 2023-03-29 ENCOUNTER — Telehealth: Payer: Self-pay | Admitting: Family Medicine

## 2023-03-29 MED ORDER — OXYCODONE HCL 10 MG PO TABS: 20.0000 mg | ORAL_TABLET | ORAL | 0 refills | Status: DC | PRN

## 2023-03-29 NOTE — Telephone Encounter (Signed)
Requesting:Oxycodone 10 mg Tabs Contract:06/27/2022 UDS:06/27/2022 Last Visit:01/09/2023 Next Visit:05/03/2023 (Virtual) Last Refill:03/02/2023  Please Advise

## 2023-03-29 NOTE — Telephone Encounter (Signed)
Prescription Request  03/29/2023  Is this a "Controlled Substance" medicine? Yes  LOV: Visit date not found  What is the name of the medication or equipment? Oxycodone HCl 10 MG TABS  Have you contacted your pharmacy to request a refill? Yes   Which pharmacy would you like this sent to?  [id number] - [place], [place] - [address] [facility] [place] [facility] [place] [address] Phone: [phone] Fax: [phone]    Patient notified that their request is being sent to the clinical staff for review and that they should receive a response within 2 business days.   Please advise at [facility] [phone]

## 2023-04-04 ENCOUNTER — Telehealth: Payer: Self-pay | Admitting: Family Medicine

## 2023-04-04 NOTE — Telephone Encounter (Signed)
Pt called and requested to speak with nurse regarding her medications. She stated that she will be out of town and that she has questions on how she should manage her intake. Please call and advise.

## 2023-04-06 NOTE — Telephone Encounter (Signed)
Called patient. No answer. Left voicemail for patient to call the office so we can get clarification of her request

## 2023-04-06 NOTE — Telephone Encounter (Signed)
Patient called the office to clarify request. She said she is going out of town to [place] on Thursday Nov. 21st  with her brother. She needs her Oxycodone to be refilled by the 20th. She spoke with the pharmacist at the [facility] in [place] and they said you could request a "Vacation Refill" to get it refilled so she won't run out while she is there

## 2023-04-08 ENCOUNTER — Other Ambulatory Visit: Payer: Self-pay | Admitting: Family Medicine

## 2023-04-08 MED ORDER — OXYCODONE HCL 10 MG PO TABS: 20.0000 mg | ORAL_TABLET | ORAL | 0 refills | Status: DC | PRN

## 2023-04-27 ENCOUNTER — Other Ambulatory Visit: Payer: Self-pay | Admitting: Family Medicine

## 2023-04-27 ENCOUNTER — Telehealth: Payer: Self-pay | Admitting: Family Medicine

## 2023-04-27 NOTE — Telephone Encounter (Signed)
Requesting: oxycodone 10mg  Contract: Yes UDS: 06/27/22 Last Visit: 06/27/2022 Next Visit: 04/27/2023 Last Refill: 04/08/23  Please Advise

## 2023-04-27 NOTE — Telephone Encounter (Signed)
Prescription Request  04/27/2023  Is this a "Controlled Substance" medicine? Yes  LOV: Visit date not found  What is the name of the medication or equipment?   Oxycodone HCl 10 MG TABS   Have you contacted your pharmacy to request a refill? No   Which pharmacy would you like this sent to?  [facility] - [place], [place] - [address] [facility] [place] [address] Phone: [phone] Fax: [phone]      Patient notified that their request is being sent to the clinical staff for review and that they should receive a response within 2 business days.   Please advise at Mobile [phone] (mobile)

## 2023-04-28 ENCOUNTER — Encounter: Payer: Self-pay | Admitting: Family Medicine

## 2023-04-30 ENCOUNTER — Other Ambulatory Visit: Payer: Self-pay | Admitting: Family Medicine

## 2023-04-30 MED ORDER — OXYCODONE HCL 10 MG PO TABS: 20.0000 mg | ORAL_TABLET | ORAL | 0 refills | Status: DC | PRN

## 2023-04-30 NOTE — Telephone Encounter (Signed)
Followed up with patient. She received the earlier voicemail. Explained the request has already been sent and we are waiting on a response.

## 2023-04-30 NOTE — Addendum Note (Signed)
Addended by: Abbe Amsterdam C on: 04/30/2023 03:25 PM   Modules accepted: Orders

## 2023-04-30 NOTE — Telephone Encounter (Signed)
Called patient. No answer. LVM letting patient know the request has already been submitted for the Oxycodone

## 2023-05-01 NOTE — Telephone Encounter (Signed)
Spoke with Marisue Ivan at the [facility] in [place], and canceled the second Oxycodone order. It had been filled already. Thank you!

## 2023-05-02 NOTE — Assessment & Plan Note (Signed)
Encouraged moist heat and gentle stretching as tolerated. May try NSAIDs and prescription meds as directed and report if symptoms worsen or seek immediate care 

## 2023-05-02 NOTE — Assessment & Plan Note (Signed)
Encouraged to get adequate exercise, calcium and vitamin d intake 

## 2023-05-02 NOTE — Assessment & Plan Note (Signed)
Monitor and report any concerns, no changes to meds. Encouraged heart healthy diet such as the DASH diet and exercise as tolerated.  ?

## 2023-05-02 NOTE — Assessment & Plan Note (Signed)
Encouraged moist heat and gentle stretching as tolerated. May try NSAIDs and prescription meds as directed and report if symptoms worsen or seek immediate care. Continues to do well on stable doses of pain meds. She is working and managing her pain well on a daily basis.

## 2023-05-03 ENCOUNTER — Telehealth (INDEPENDENT_AMBULATORY_CARE_PROVIDER_SITE_OTHER): Payer: BLUE CROSS/BLUE SHIELD | Admitting: Family Medicine

## 2023-05-03 VITALS — BP 135/70 | Wt 246.0 lb

## 2023-05-03 DIAGNOSIS — I10 Essential (primary) hypertension: Secondary | ICD-10-CM | POA: Diagnosis not present

## 2023-05-03 DIAGNOSIS — M8580 Other specified disorders of bone density and structure, unspecified site: Secondary | ICD-10-CM | POA: Diagnosis not present

## 2023-05-03 DIAGNOSIS — M545 Low back pain, unspecified: Secondary | ICD-10-CM

## 2023-05-03 DIAGNOSIS — G894 Chronic pain syndrome: Secondary | ICD-10-CM

## 2023-05-06 ENCOUNTER — Encounter: Payer: Self-pay | Admitting: Family Medicine

## 2023-05-06 NOTE — Progress Notes (Signed)
MyChart Video Visit    Virtual Visit via Video Note   This patient is at least at moderate risk for complications without adequate follow up. This format is felt to be most appropriate for this patient at this time. Physical exam was limited by quality of the video and audio technology used for the visit. Juanetta, CMA was able to get the patient set up on a video visit.  Patient location: home Patient and provider in visit Provider location: Office  I discussed the limitations of evaluation and management by telemedicine and the availability of in person appointments. The patient expressed understanding and agreed to proceed.  Visit Date: 05/03/2023  Today's healthcare provider: Danise Edge, MD     Subjective:    Patient ID: Jennifer Kelly, female    DOB: 05-20-1961, 62 y.o.   MRN: [id number]  Chief Complaint  Patient presents with   Follow-up      History of Present Illness        Patient is a 62 yo female in for a video visit day to discuss care. After her scare several years with possible myeloma it has been determined via bone marrow biopsy that she does not have cancer. Unfortunately she continues to struggle with daily pain. She tries to stay active. Denies CP/palp/SOB/HA/congestion/fevers/GI or GU c/o. Taking meds as prescribed     Past Medical History:  Diagnosis Date   Acute bronchitis 01/05/2014   Arthritis    Chicken pox as a child   Dermatitis 09/09/2014   Ganglion cyst of left foot 09/09/2014   H/O mumps    H/O tobacco use, presenting hazards to health 10/08/2009   Qualifier: Diagnosis of  By: Debby Bud MD, Rosalyn Gess  Last cigarette 02/16/2016    History of chicken pox    Hyperglycemia 11/30/2015   Hypertension    Morbid obesity (HCC) 08/31/2009   Qualifier: Diagnosis of  By: Debby Bud MD, Rosalyn Gess  BMI 34.4(Sept '13) BMI 34 (March '15)    Mumps as a child   Osteopenia 01/02/2017   Osteoporosis 11/20/2016   Psoriatic arthritis (HCC)    Rheumatoid arthritis  (HCC)    Shingles 2014    Past Surgical History:  Procedure Laterality Date   BACK SURGERY  2003   NECK SURGERY  02/17/2016   C4, C5 & C6   rotater cuff  20 yrs ago   left shoulder   TOOTH EXTRACTION  05/2012   WISDOM TOOTH EXTRACTION  62 yrs old    Family History  Problem Relation Age of Onset   Parkinson's disease Mother    Heart disease Mother    Hypertension Mother    Diabetes Father        type 2   Hypertension Father    Stroke Father    Vascular Disease Brother    Hypertension Brother    Diabetes Brother    Heart attack Paternal Uncle     Social History   Socioeconomic History   Marital status: Divorced    Spouse name: Not on file   Number of children: 0   Years of education: Not on file   Highest education level: Not on file  Occupational History   Occupation: help Youth worker  Tobacco Use   Smoking status: Former    Types: Cigarettes    Start date: 11/30/2015   Smokeless tobacco: Never  Substance and Sexual Activity   Alcohol use: Yes    Comment: occ wine   Drug use: No  Sexual activity: Never    Comment: no dietary restrictions. lives alone  Other Topics Concern   Not on file  Social History Narrative   Not on file   Social Drivers of Health   Financial Resource Strain: Not on file  Food Insecurity: Not on file  Transportation Needs: Not on file  Physical Activity: Not on file  Stress: Not on file  Social Connections: Not on file  Intimate Partner Violence: Not on file    Outpatient Medications Prior to Visit  Medication Sig Dispense Refill   ACTEMRA ACTPEN 162 MG/0.9ML SOAJ Inject into the skin.     albuterol (VENTOLIN HFA) 108 (90 Base) MCG/ACT inhaler Inhale 2 puffs into the lungs every 6 (six) hours as needed for wheezing or shortness of breath. 18 g 5   celecoxib (CELEBREX) 200 MG capsule Take 200 mg by mouth in the morning and at bedtime.     citalopram (CELEXA) 20 MG tablet TAKE 1 TABLET (20MG ) BY MOUTH EVERY DAY 90 tablet 1    diltiazem (TIADYLT ER) 240 MG 24 hr capsule Take 1 capsule (240 mg total) by mouth daily. 90 capsule 1   fentaNYL (DURAGESIC) 75 MCG/HR Place 1 patch onto the skin every 3 (three) days. 10 patch 0   hydrochlorothiazide (MICROZIDE) 12.5 MG capsule TAKE 1 CAPSULE (12.5MG ) BY MOUTH EVERY DAY 90 capsule 1   losartan (COZAAR) 50 MG tablet Take 1 tablet (50 mg total) by mouth daily. 90 tablet 1   mupirocin ointment (BACTROBAN) 2 % PLACE 1 APPLICATION INTO THE NOSE DAILY. VIA Q TIP 22 g 0   naloxone (NARCAN) nasal spray 4 mg/0.1 mL I dose sprayed in one nostril every 2-3 minutes until patient is responsive, awake or EMS arrives. 1 each 2   Oxycodone HCl 10 MG TABS Take 2 tablets (20 mg total) by mouth every 4 (four) hours as needed. 180 tablet 0   Oxycodone HCl 10 MG TABS Take 2 tablets (20 mg total) by mouth every 4 (four) hours as needed. 180 tablet 0   predniSONE (DELTASONE) 20 MG tablet Take 20 mg by mouth in the morning and at bedtime.     venlafaxine XR (EFFEXOR-XR) 75 MG 24 hr capsule TAKE 1 CAPSULE BY MOUTH EVERY DAY 90 capsule 1   No facility-administered medications prior to visit.    Allergies  Allergen Reactions   Simponi [Golimumab] Rash    States that it was systemic   Erythromycin     Upset stomach    Review of Systems  Constitutional:  Negative for fever and malaise/fatigue.  HENT:  Negative for congestion.   Eyes:  Negative for blurred vision.  Respiratory:  Negative for shortness of breath.   Cardiovascular:  Negative for chest pain, palpitations and leg swelling.  Gastrointestinal:  Negative for abdominal pain, blood in stool and nausea.  Genitourinary:  Negative for dysuria and frequency.  Musculoskeletal:  Positive for back pain, joint pain, myalgias and neck pain. Negative for falls.  Skin:  Negative for rash.  Neurological:  Negative for dizziness, loss of consciousness and headaches.  Endo/Heme/Allergies:  Negative for environmental allergies.   Psychiatric/Behavioral:  Negative for depression. The patient is nervous/anxious.        Objective:    Physical Exam Constitutional:      General: She is not in acute distress.    Appearance: Normal appearance. She is not ill-appearing or toxic-appearing.  HENT:     Head: Normocephalic and atraumatic.     Right  Ear: External ear normal.     Left Ear: External ear normal.     Nose: Nose normal.  Eyes:     General:        Right eye: No discharge.        Left eye: No discharge.  Pulmonary:     Effort: Pulmonary effort is normal.  Skin:    Findings: No rash.  Neurological:     Mental Status: She is alert and oriented to person, place, and time.  Psychiatric:        Behavior: Behavior normal.     BP 135/70 Comment: Had done at [facility]  Wt 246 lb (111.6 kg) Comment: Pt stated  BMI 34.31 kg/m  Wt Readings from Last 3 Encounters:  05/03/23 246 lb (111.6 kg)  06/27/22 245 lb (111.1 kg)  08/16/21 235 lb (106.6 kg)       Assessment & Plan:  Chronic pain syndrome Assessment & Plan: Encouraged moist heat and gentle stretching as tolerated. May try NSAIDs and prescription meds as directed and report if symptoms worsen or seek immediate care. Continues to do well on stable doses of pain meds. She is working and managing her pain well on a daily basis.    Low back pain, unspecified back pain laterality, unspecified chronicity, unspecified whether sciatica present Assessment & Plan: Encouraged moist heat and gentle stretching as tolerated. May try NSAIDs and prescription meds as directed and report if symptoms worsen or seek immediate care    Osteopenia, unspecified location Assessment & Plan: Encouraged to get adequate exercise, calcium and vitamin d intake    Primary hypertension Assessment & Plan: Monitor and report any concerns, no changes to meds. Encouraged heart healthy diet such as the DASH diet and exercise as tolerated.        Assessment and Plan               I discussed the assessment and treatment plan with the patient. The patient was provided an opportunity to ask questions and all were answered. The patient agreed with the plan and demonstrated an understanding of the instructions.   The patient was advised to call back or seek an in-person evaluation if the symptoms worsen or if the condition fails to improve as anticipated.  Danise Edge, MD [facility] at [facility] [phone] (phone) [phone] (fax)  [facility]

## 2023-05-10 ENCOUNTER — Other Ambulatory Visit: Payer: Self-pay | Admitting: Family Medicine

## 2023-05-10 NOTE — Telephone Encounter (Signed)
Was refilled on 04/30/23

## 2023-05-10 NOTE — Telephone Encounter (Signed)
Copied from CRM [id number]. Topic: Clinical - Medication Refill >> May 10, 2023 12:53 PM Denese Killings wrote: Most Recent Primary Care Visit:  Provider: Danise Edge A  Department: [facility]  Visit Type: MYCHART VIDEO VISIT  Date: 05/03/2023  Medication: Oxycodone HCl 10 MG TABS  Has the patient contacted their pharmacy? No  (Agent: If no, request that the patient contact the pharmacy for the refill. If patient does not wish to contact the pharmacy document the reason why and proceed with request.) (Agent: If yes, when and what did the pharmacy advise?)  Is this the correct pharmacy for this prescription? Yes If no, delete pharmacy and type the correct one.  This is the patient's preferred pharmacy:  [id number] - [place], [place] - [address] [facility] [place] [address] Phone: [phone] Fax: [phone]   Has the prescription been filled recently? Yes  Is the patient out of the medication? No  Has the patient been seen for an appointment in the last year OR does the patient have an upcoming appointment?   Can we respond through MyChart?   Agent: Please be advised that Rx refills may take up to 3 business days. We ask that you follow-up with your pharmacy.

## 2023-05-11 ENCOUNTER — Other Ambulatory Visit: Payer: Self-pay | Admitting: Family Medicine

## 2023-05-11 MED ORDER — OXYCODONE HCL 10 MG PO TABS: 20.0000 mg | ORAL_TABLET | ORAL | 0 refills | Status: DC | PRN

## 2023-05-25 ENCOUNTER — Other Ambulatory Visit: Payer: Self-pay | Admitting: Family Medicine

## 2023-05-25 ENCOUNTER — Telehealth: Payer: Self-pay | Admitting: Emergency Medicine

## 2023-05-25 ENCOUNTER — Other Ambulatory Visit: Payer: Self-pay | Admitting: Family

## 2023-05-25 MED ORDER — OXYCODONE HCL 10 MG PO TABS: 20.0000 mg | ORAL_TABLET | ORAL | 0 refills | Status: DC | PRN

## 2023-05-25 NOTE — Telephone Encounter (Signed)
 Copied from CRM [id number]. Topic: Clinical - Medication Refill >> May 25, 2023 10:26 AM Thersia BROCKS wrote: Most Recent Primary Care Visit:  Provider: DOMENICA BLACKBIRD A  Department: [facility]  Visit Type: MYCHART VIDEO VISIT  Date: 05/03/2023  Medication: ***  Has the patient contacted their pharmacy?  (Agent: If no, request that the patient contact the pharmacy for the refill. If patient does not wish to contact the pharmacy document the reason why and proceed with request.) (Agent: If yes, when and what did the pharmacy advise?)  Is this the correct pharmacy for this prescription?  If no, delete pharmacy and type the correct one.  This is the patient's preferred pharmacy:  [id number] - [place], [place] - [address] [facility] [place] [address] Phone: [phone] Fax: [phone]  [id number] - [place], [place] - [address] [facility] [place] [address] Phone: [phone] Fax: [phone]   Has the prescription been filled recently?   Is the patient out of the medication?   Has the patient been seen for an appointment in the last year OR does the patient have an upcoming appointment?   Can we respond through MyChart?   Agent: Please be advised that Rx refills may take up to 3 business days. We ask that you follow-up with your pharmacy.

## 2023-05-25 NOTE — Telephone Encounter (Signed)
 Requesting: Oxycodone HCI 10 mg Tablet Contract: 2/6/224 UDS:  06/27/2022 Last Visit: 06/27/2022 Next Visit:None Last Refill:05/11/2023  Please Advise

## 2023-05-25 NOTE — Telephone Encounter (Signed)
 Copied from CRM [id number]. Topic: Clinical - Medication Refill >> May 25, 2023 10:26 AM Thersia BROCKS wrote: Most Recent Primary Care Visit:  Provider: DOMENICA BLACKBIRD A  Department: [facility]  Visit Type: MYCHART VIDEO VISIT  Date: 05/03/2023  Medication: Oxycodone  HCl 10 MG TABS  Has the patient contacted their pharmacy? Yes (Agent: If no, request that the patient contact the pharmacy for the refill. If patient does not wish to contact the pharmacy document the reason why and proceed with request.) (Agent: If yes, when and what did the pharmacy advise?)  Is this the correct pharmacy for this prescription? Yes If no, delete pharmacy and type the correct one.  This is the patient's preferred pharmacy:  [id number] - [place], [place] - [address] [facility] [place] [address] Phone: [phone] Fax: [phone]     Has the prescription been filled recently? Yes  Is the patient out of the medication? Yes  Has the patient been seen for an appointment in the last year OR does the patient have an upcoming appointment? Yes  Can we respond through MyChart? Yes  Agent: Please be advised that Rx refills may take up to 3 business days. We ask that you follow-up with your pharmacy.

## 2023-05-29 ENCOUNTER — Other Ambulatory Visit: Payer: Self-pay | Admitting: Family Medicine

## 2023-05-29 ENCOUNTER — Encounter: Payer: Self-pay | Admitting: Family Medicine

## 2023-05-29 ENCOUNTER — Other Ambulatory Visit: Payer: Self-pay | Admitting: Family

## 2023-05-29 NOTE — Telephone Encounter (Signed)
 Too early, was refilled on 05/11/23

## 2023-05-29 NOTE — Telephone Encounter (Signed)
 Copied from CRM [id number]. Topic: Clinical - Medication Refill >> May 29, 2023 11:24 AM Kara C wrote: Most Recent Primary Care Visit:  Provider: DOMENICA BLACKBIRD A  Department: [facility]  Visit Type: MYCHART VIDEO VISIT  Date: 05/03/2023  Medication: Oxycodone  HCl 10 MG TABS  Has the patient contacted their pharmacy? Yes (Agent: If no, request that the patient contact the pharmacy for the refill. If patient does not wish to contact the pharmacy document the reason why and proceed with request.) (Agent: If yes, when and what did the pharmacy advise?)  Is this the correct pharmacy for this prescription?  If no, delete pharmacy and type the correct one.  This is the patient's preferred pharmacy:  [id number] - [place], [place] - [address] [facility] [place] [address] Phone: [phone] Fax: [phone]    Has the prescription been filled recently?   Is the patient out of the medication?   Has the patient been seen for an appointment in the last year OR does the patient have an upcoming appointment?   Can we respond through MyChart?   Agent: Please be advised that Rx refills may take up to 3 business days. We ask that you follow-up with your pharmacy.

## 2023-05-30 ENCOUNTER — Telehealth: Payer: Self-pay | Admitting: *Deleted

## 2023-05-30 ENCOUNTER — Other Ambulatory Visit: Payer: Self-pay | Admitting: Family

## 2023-05-30 MED ORDER — OXYCODONE HCL 10 MG PO TABS: 20.0000 mg | ORAL_TABLET | ORAL | 0 refills | Status: DC | PRN

## 2023-05-30 NOTE — Telephone Encounter (Signed)
 Copied from CRM [id number]. Topic: Clinical - Prescription Issue >> May 30, 2023  1:40 PM Florestine Avers wrote: Reason for CRM: Patient is requesting for Dr. Mariel Aloe nurse "Baltazar Najjar" to call her back as soon as sh can in regards to an issue with her prescription.

## 2023-06-01 NOTE — Telephone Encounter (Signed)
 Called patient. No answer. LVM

## 2023-06-04 NOTE — Telephone Encounter (Signed)
 Called to follow up on message. No answer. Left voicemail message. I also checked the patient's medication record. Her medication was refilled on I/12/2023.

## 2023-06-09 ENCOUNTER — Other Ambulatory Visit: Payer: Self-pay | Admitting: Family Medicine

## 2023-06-12 ENCOUNTER — Other Ambulatory Visit: Payer: Self-pay | Admitting: Family Medicine

## 2023-06-12 DIAGNOSIS — I10 Essential (primary) hypertension: Secondary | ICD-10-CM

## 2023-06-13 ENCOUNTER — Other Ambulatory Visit: Payer: Self-pay | Admitting: Family Medicine

## 2023-06-13 MED ORDER — OXYCODONE HCL 10 MG PO TABS: 20.0000 mg | ORAL_TABLET | ORAL | 0 refills | Status: DC | PRN

## 2023-06-13 NOTE — Telephone Encounter (Signed)
Copied from CRM [id number]. Topic: Clinical - Medication Refill >> Jun 13, 2023 11:11 AM Orinda Kenner C wrote: Most Recent Primary Care Visit:  Provider: Danise Edge A  Department: [facility]  Visit Type: MYCHART VIDEO VISIT  Date: 05/03/2023  Medication: Oxycodone HCl 10 MG TABS 2 tablets every 4 hours as needed #180   Has the patient contacted their pharmacy? Yes (Agent: If no, request that the patient contact the pharmacy for the refill. If patient does not wish to contact the pharmacy document the reason why and proceed with request.) (Agent: If yes, when and what did the pharmacy advise?)  Is this the correct pharmacy for this prescription? Yes If no, delete pharmacy and type the correct one.  This is the patient's preferred pharmacy:  [facility] - [place], [place] - [address] [facility] [place] [address] Phone: [phone] Fax: [phone]   Has the prescription been filled recently? Yes  Is the patient out of the medication? No  Has the patient been seen for an appointment in the last year OR does the patient have an upcoming appointment? Yes  Can we respond through MyChart? Yes  Agent: Please be advised that Rx refills may take up to 3 business days. We ask that you follow-up with your pharmacy.

## 2023-06-13 NOTE — Telephone Encounter (Signed)
Requesting: oxycodone  Contract: 06/27/22 UDS: 06/27/22 Last Visit: 05/03/23 Next Visit: None Last Refill: 05/30/23 #180 and 0RF   Please Advise

## 2023-06-27 ENCOUNTER — Other Ambulatory Visit: Payer: Self-pay | Admitting: Family Medicine

## 2023-06-27 NOTE — Telephone Encounter (Signed)
 Requesting: Oxycodone  10 mg Contract: 06/27/2022 UDS: 06/27/2022 Last Visit: 05/03/2023 VV Next Visit: N/A Last Refill: 06/13/2023  Please Advise

## 2023-06-27 NOTE — Telephone Encounter (Signed)
 Copied from CRM [id number]. Topic: Clinical - Medication Refill >> Jun 27, 2023  2:36 PM Russell PARAS wrote: Most Recent Primary Care Visit:  Provider: DOMENICA BLACKBIRD A  Department: [facility]  Visit Type: MYCHART VIDEO VISIT  Date: 05/03/2023  Medication: Oxycodone  HCl 10 MG TABS  Has the patient contacted their pharmacy? No (Agent: If no, request that the patient contact the pharmacy for the refill. If patient does not wish to contact the pharmacy document the reason why and proceed with request.) (Agent: If yes, when and what did the pharmacy advise?)  Is this the correct pharmacy for this prescription? Yes If no, delete pharmacy and type the correct one.  This is the patient's preferred pharmacy:  [id number] - [place], [place] - [address] [facility] [place] [address] Phone: [phone] Fax: [phone]  [id number] - [place], [place] - [address] [facility] [place] [address] Phone: [phone] Fax: [phone]   Has the prescription been filled recently? Yes  Is the patient out of the medication? No  Has the patient been seen for an appointment in the last year OR does the patient have an upcoming appointment? Yes  Can we respond through MyChart? Yes  Agent: Please be advised that Rx refills may take up to 3 business days. We ask that you follow-up with your pharmacy.

## 2023-06-28 ENCOUNTER — Other Ambulatory Visit: Payer: Self-pay | Admitting: Family Medicine

## 2023-06-29 ENCOUNTER — Encounter: Payer: Self-pay | Admitting: Family Medicine

## 2023-06-29 MED ORDER — OXYCODONE HCL 10 MG PO TABS: 20.0000 mg | ORAL_TABLET | ORAL | 0 refills | Status: DC | PRN

## 2023-06-29 NOTE — Telephone Encounter (Signed)
 Copied from CRM [id number]. Topic: General - Other >> Jun 29, 2023  4:02 PM Barney Boozer wrote: Reason for CRM: Oxycodone  HCl 10 MG TABS

## 2023-06-29 NOTE — Telephone Encounter (Signed)
 Pharmacy send rx refill for Oxycodone  10 mg and last controlled substance agreement was signed on 06/27/22,last visit was 05/03/23 and no future appointments schedule.

## 2023-07-02 NOTE — Telephone Encounter (Signed)
 Called patient to check on the status of her request for the Oxycodone . No answer. LVM

## 2023-07-11 ENCOUNTER — Other Ambulatory Visit: Payer: Self-pay | Admitting: Family Medicine

## 2023-07-11 ENCOUNTER — Ambulatory Visit: Payer: Self-pay | Admitting: Family Medicine

## 2023-07-11 MED ORDER — OXYCODONE HCL 10 MG PO TABS: 20.0000 mg | ORAL_TABLET | ORAL | 0 refills | Status: DC | PRN

## 2023-07-11 NOTE — Telephone Encounter (Signed)
 Requesting: oxycodone 10mg  Contract: No UDS: 06/27/22 Last Visit: 05/03/23 Next Visit: 12/13/23 Last Refill: 06/29/23  Please Advise

## 2023-07-11 NOTE — Telephone Encounter (Signed)
 Copied from CRM [id number]. Topic: Clinical - Prescription Issue >> Jul 11, 2023  1:13 PM Geneva B wrote: Reason for CRM: Oxycodone HCl 10 MG TABS patient says that she needs a refill and do not have anymore refills and states that she never comes in for appt when she do not have refills that the nurse just calls them in for her please call pt back [phone]   Patient called for a refill of her oxycodone. She states that she normally calls in for the refill without needing an appointment. I advised I could send a request over to the office for Dr. Abner Greenspan to review for the refill. Patient states she would like it filled by Saturday for pickup if possible. Patient would like a call back with a response to her request.    Reason for Disposition  Caller requesting a CONTROLLED substance prescription refill (e.g., narcotics, ADHD medicines)  Answer Assessment - Initial Assessment Questions 1. DRUG NAME: "What medicine do you need to have refilled?"     Oxycodone  2. REFILLS REMAINING: "How many refills are remaining?" (Note: The label on the medicine or pill bottle will show how many refills are remaining. If there are no refills remaining, then a renewal may be needed.)     0 4. PRESCRIBING HCP: "Who prescribed it?" Reason: If prescribed by specialist, call should be referred to that group.     Dr. Abner Greenspan  Protocols used: Medication Refill and Renewal Call-A-AH

## 2023-07-11 NOTE — Telephone Encounter (Signed)
 Attempted to call pt back r/t refill request: no answer: left message to return call. FY: pt last filled this medication on 06/30/23 per Pharmacy : [facility] [address], [place] - [address] Phone: [phone]  Fax: [phone]      and earliest medication can be refilled is 07/15/23 per pharmacy.

## 2023-07-11 NOTE — Telephone Encounter (Signed)
 Copied from CRM [id number]. Topic: Clinical - Prescription Issue >> Jul 11, 2023  1:13 PM Geneva B wrote: Reason for CRM: Oxycodone HCl 10 MG TABS patient says that she needs a refill and do not have anymore refills and states that she never comes in for appt when she do not have refills that the nurse just calls them in for her please call pt back [phone]

## 2023-07-25 ENCOUNTER — Other Ambulatory Visit: Payer: Self-pay | Admitting: Family Medicine

## 2023-07-26 ENCOUNTER — Telehealth: Payer: Self-pay | Admitting: Family Medicine

## 2023-07-26 ENCOUNTER — Other Ambulatory Visit: Payer: Self-pay | Admitting: Family Medicine

## 2023-07-26 MED ORDER — OXYCODONE HCL 10 MG PO TABS: 20.0000 mg | ORAL_TABLET | ORAL | 0 refills | Status: DC | PRN

## 2023-07-26 NOTE — Telephone Encounter (Signed)
 Requesting: Oxycodone 10 mg  Contract: 06/27/2022 UDS: 06/27/2022 Last Visit: 05/03/2023 VV Next Visit: 12/13/2023 Last Refill: 07/11/2023  Please Advise

## 2023-07-26 NOTE — Telephone Encounter (Signed)
 Patient is calling in regarding her oxycodone refilled she is needing it refilled before Friday

## 2023-07-26 NOTE — Telephone Encounter (Signed)
 Closing this duplicate encounter. Please see refill encounter for 07/25/23 that has already been routed to Dr Danise Edge.  Copied from CRM [id number]. Topic: Clinical - Medication Refill >> Jul 26, 2023 10:29 AM Aletta Edouard wrote: Most Recent Primary Care Visit:  Provider: Danise Edge A  Department: [facility]  Visit Type: MYCHART VIDEO VISIT  Date: 05/03/2023  Medication: Oxycodone HCl 10 MG TABS  Has the patient contacted their pharmacy? Yes (Agent: If no, request that the patient contact the pharmacy for the refill. If patient does not wish to contact the pharmacy document the reason why and proceed with request.) (Agent: If yes, when and what did the pharmacy advise?)  Is this the correct pharmacy for this prescription? Yes If no, delete pharmacy and type the correct one.  This is the patient's preferred pharmacy:  [facility] - [place], [place] - [address] [facility] [place] [address] Phone: [phone] Fax: [phone]   Has the prescription been filled recently? Yes  Is the patient out of the medication? Yes  Has the patient been seen for an appointment in the last year OR does the patient have an upcoming appointment? Yes  Can we respond through MyChart? No  Agent: Please be advised that Rx refills may take up to 3 business days. We ask that you follow-up with your pharmacy.

## 2023-07-26 NOTE — Telephone Encounter (Signed)
 Requesting: oxycodone 10mg   Contract: 06/27/22 UDS: 06/27/22 Last Visit: 05/03/23 Next Visit: 12/13/23 Last Refill: 07/11/23 #180 and 1OX  Please Advise

## 2023-08-03 ENCOUNTER — Other Ambulatory Visit: Payer: Self-pay | Admitting: Family Medicine

## 2023-08-08 ENCOUNTER — Other Ambulatory Visit: Payer: Self-pay | Admitting: Family Medicine

## 2023-08-08 MED ORDER — OXYCODONE HCL 10 MG PO TABS: 20.0000 mg | ORAL_TABLET | ORAL | 0 refills | Status: DC | PRN

## 2023-08-08 NOTE — Telephone Encounter (Signed)
 Requesting: Oxycodone 10 mg  Contract:06/27/2022 UDS: 06/27/2022 Last Visit: 05/03/2023 Next Visit: 12/13/2023 Last Refill: 07/26/2023  Please Advise

## 2023-08-08 NOTE — Telephone Encounter (Signed)
 Copied from CRM [id number]. Topic: Clinical - Medication Refill >> Aug 08, 2023  3:34 PM Gurney Maxin H wrote: Most Recent Primary Care Visit:  Provider: Danise Edge A  Department: [facility]  Visit Type: MYCHART VIDEO VISIT  Date: 05/03/2023  Medication: Oxycodone HCl 10 MG TABS  Has the patient contacted their pharmacy? No, controlled substance (Agent: If no, request that the patient contact the pharmacy for the refill. If patient does not wish to contact the pharmacy document the reason why and proceed with request.) (Agent: If yes, when and what did the pharmacy advise?)  Is this the correct pharmacy for this prescription? Yes If no, delete pharmacy and type the correct one.  This is the patient's preferred pharmacy:  [facility] - [place], [place] - [address] [facility] [place] [address] Phone: [phone] Fax: [phone]    Has the prescription been filled recently? Yes, patient states she refills medication every 16 days  Is the patient out of the medication? No  Has the patient been seen for an appointment in the last year OR does the patient have an upcoming appointment? Yes  Can we respond through MyChart? Yes  Agent: Please be advised that Rx refills may take up to 3 business days. We ask that you follow-up with your pharmacy.

## 2023-08-22 ENCOUNTER — Other Ambulatory Visit: Payer: Self-pay | Admitting: Family Medicine

## 2023-08-22 MED ORDER — OXYCODONE HCL 10 MG PO TABS
20.0000 mg | ORAL_TABLET | ORAL | 0 refills | Status: DC | PRN
Start: 2023-08-22 — End: 2023-09-06

## 2023-08-22 NOTE — Telephone Encounter (Signed)
 Requesting: oxycodone 10mg   Contract:06/27/22 UDS: 06/27/22 Last Visit: 05/03/23 Next Visit: 12/13/23 Last Refill: 08/08/23 #180 and 0RF   Please Advise

## 2023-09-05 ENCOUNTER — Telehealth: Payer: Self-pay | Admitting: Family Medicine

## 2023-09-05 ENCOUNTER — Encounter: Payer: Self-pay | Admitting: Family Medicine

## 2023-09-05 NOTE — Telephone Encounter (Signed)
 Copied from CRM [id number]. Topic: Clinical - Medication Refill >> Sep 05, 2023  2:26 PM Aisha D wrote: Most Recent Primary Care Visit:  Provider: Randie Bustle A  Department: [facility]  Visit Type: MYCHART VIDEO VISIT  Date: 05/03/2023  Medication: Oxycodone HCl 10 MG TABS  Has the patient contacted their pharmacy? Yes (Agent: If no, request that the patient contact the pharmacy for the refill. If patient does not wish to contact the pharmacy document the reason why and proceed with request.) (Agent: If yes, when and what did the pharmacy advise?)  Is this the correct pharmacy for this prescription? Yes If no, delete pharmacy and type the correct one.  This is the patient's preferred pharmacy:  [facility] - [place], [place] - [address] [facility] [place] [address] Phone: [phone] Fax: [phone]  Has the prescription been filled recently? Yes  Is the patient out of the medication? No  Has the patient been seen for an appointment in the last year OR does the patient have an upcoming appointment? Yes  Can we respond through MyChart? Yes  Agent: Please be advised that Rx refills may take up to 3 business days. We ask that you follow-up with your pharmacy.

## 2023-09-06 ENCOUNTER — Telehealth: Payer: Self-pay | Admitting: Family Medicine

## 2023-09-06 ENCOUNTER — Other Ambulatory Visit: Payer: Self-pay | Admitting: Family

## 2023-09-06 MED ORDER — OXYCODONE HCL 10 MG PO TABS: 20.0000 mg | ORAL_TABLET | ORAL | 0 refills | Status: DC | PRN

## 2023-09-06 NOTE — Telephone Encounter (Signed)
 Duplicate request, was sent to PCP yesterday. Pending refill.

## 2023-09-06 NOTE — Telephone Encounter (Signed)
 Already transmitted

## 2023-09-06 NOTE — Telephone Encounter (Signed)
 Copied from CRM [id number]. Topic: Clinical - Prescription Issue >> Sep 06, 2023 12:29 PM Chuck Crater wrote: Reason for CRM: Patient states that she gets 2 Oxycodone HCl 10 MG TABS (15 day supply) twice a month. 04/02 was her first one. Patient is wanting an update on refill, advised of 3 day turn around.

## 2023-09-06 NOTE — Telephone Encounter (Signed)
 Refill requested forwarded to Dr. Rodrick Clapper yesterday, pending refill.

## 2023-09-06 NOTE — Telephone Encounter (Signed)
 Requesting: oxycodone 10mg   Contract: 06/27/22 UDS: 06/27/22 Last Visit: 05/03/23 Next Visit: 12/13/23 Last Refill: 08/22/23 #180 and 0RF    Please Advise

## 2023-09-12 ENCOUNTER — Other Ambulatory Visit: Payer: Self-pay | Admitting: Family Medicine

## 2023-09-12 DIAGNOSIS — I10 Essential (primary) hypertension: Secondary | ICD-10-CM

## 2023-09-19 ENCOUNTER — Telehealth: Payer: Self-pay | Admitting: Emergency Medicine

## 2023-09-19 NOTE — Telephone Encounter (Signed)
 Copied from CRM [id number]. Topic: Clinical - Medication Refill >> Sep 19, 2023  1:04 PM Annelle Kiel wrote: Most Recent Primary Care Visit:  Provider: Randie Bustle A  Department: [facility]  Visit Type: MYCHART VIDEO VISIT  Date: 05/03/2023  Medication: Oxycodone  HCl 10 MG TABS  Has the patient contacted their pharmacy? No (Agent: If no, request that the patient contact the pharmacy for the refill. If patient does not wish to contact the pharmacy document the reason why and proceed with request.) (Agent: If yes, when and what did the pharmacy advise?)  Is this the correct pharmacy for this prescription? Yes If no, delete pharmacy and type the correct one.  This is the patient's preferred pharmacy:  [facility] - [place], [place] - [address] [facility] [place] [address] Phone: [phone] Fax: [phone]  Has the prescription been filled recently? No  Is the patient out of the medication? Yes  Has the patient been seen for an appointment in the last year OR does the patient have an upcoming appointment? Yes  Can we respond through MyChart? No  Agent: Please be advised that Rx refills may take up to 3 business days. We ask that you follow-up with your pharmacy.

## 2023-09-20 MED ORDER — OXYCODONE HCL 10 MG PO TABS: 20.0000 mg | ORAL_TABLET | ORAL | 0 refills | Status: DC | PRN

## 2023-09-20 NOTE — Addendum Note (Signed)
 Addended by: Antonie Borjon L on: 09/20/2023 10:43 AM   Modules accepted: Orders

## 2023-09-20 NOTE — Telephone Encounter (Signed)
 Requesting: oxycodone  10mg   Contract:06/27/22 UDS: 06/27/22 Last Visit: 05/03/23 Next Visit: 12/13/23 Last Refill: 09/06/23 #180 and 0RF    Please Advise

## 2023-10-02 ENCOUNTER — Other Ambulatory Visit: Payer: Self-pay | Admitting: Family Medicine

## 2023-10-02 ENCOUNTER — Encounter: Payer: Self-pay | Admitting: Family Medicine

## 2023-10-02 NOTE — Telephone Encounter (Signed)
 Requesting: oxycodone  10mg   Contract:06/27/22 UDS:06/27/22 Last Visit: 05/03/23 Next Visit: 12/13/23 Last Refill: 09/20/23 #180 and 0RF   Pt has only been getting a 15 day supply, can we adjust this to a 30 day supply?  Please Advise

## 2023-10-03 MED ORDER — OXYCODONE HCL 10 MG PO TABS: 20.0000 mg | ORAL_TABLET | ORAL | 0 refills | Status: DC | PRN

## 2023-10-16 ENCOUNTER — Other Ambulatory Visit: Payer: Self-pay | Admitting: Family Medicine

## 2023-10-16 NOTE — Telephone Encounter (Signed)
 Refilled on 10/03/23 by Padonda for 180 tablets

## 2023-10-16 NOTE — Telephone Encounter (Signed)
 Copied from CRM [id number]. Topic: Clinical - Medication Refill >> Oct 16, 2023  3:41 PM Martinique E wrote: Medication: Oxycodone  HCl 10 MG TABS  Has the patient contacted their pharmacy? No (Agent: If no, request that the patient contact the pharmacy for the refill. If patient does not wish to contact the pharmacy document the reason why and proceed with request.) (Agent: If yes, when and what did the pharmacy advise?)  This is the patient's preferred pharmacy:  [facility] - [place], [place] - [address] [facility] [place] [address] Phone: [phone] Fax: [phone]   Is this the correct pharmacy for this prescription? Yes If no, delete pharmacy and type the correct one.   Has the prescription been filled recently? Yes  Is the patient out of the medication? No, 1 day left.  Has the patient been seen for an appointment in the last year OR does the patient have an upcoming appointment? Yes  Can we respond through MyChart? Yes  Agent: Please be advised that Rx refills may take up to 3 business days. We ask that you follow-up with your pharmacy.

## 2023-10-17 ENCOUNTER — Other Ambulatory Visit: Payer: Self-pay | Admitting: Family Medicine

## 2023-10-17 ENCOUNTER — Telehealth: Payer: Self-pay | Admitting: Family Medicine

## 2023-10-17 NOTE — Telephone Encounter (Unsigned)
 Copied from CRM [id number]. Topic: Clinical - Medication Refill >> Oct 17, 2023  2:39 PM Trula Gable C wrote: Medication: Oxycodone  HCl 10 MG TABS  Has the patient contacted their pharmacy? Yes (Agent: If no, request that the patient contact the pharmacy for the refill. If patient does not wish to contact the pharmacy document the reason why and proceed with request.) (Agent: If yes, when and what did the pharmacy advise?)  This is the patient's preferred pharmacy:  [facility] - [place], [place] - [address] [facility] [place] [address] Phone: [phone] Fax: [phone]  Is this the correct pharmacy for this prescription? Yes If no, delete pharmacy and type the correct one.   Has the prescription been filled recently? No  Is the patient out of the medication? Yes  Has the patient been seen for an appointment in the last year OR does the patient have an upcoming appointment? Yes  Can we respond through MyChart? Yes  Agent: Please be advised that Rx refills may take up to 3 business days. We ask that you follow-up with your pharmacy.

## 2023-10-17 NOTE — Telephone Encounter (Signed)
 Copied from CRM [id number]. Topic: Clinical - Medication Refill >> Oct 17, 2023  4:53 PM Arm[place] J wrote: Patient called to follow up on her refill request for Oxycodone  HCl 10 MG TABS. She takes 2 tablets every 4 hours and she is out of her supply. She also wanted to let the clinical team know that she does not want to try and turn her 15-day supply of medication into a 30-day supply and she is okay with how she's been requesting refills.

## 2023-10-18 MED ORDER — OXYCODONE HCL 10 MG PO TABS: 20.0000 mg | ORAL_TABLET | ORAL | 0 refills | Status: DC | PRN

## 2023-10-18 NOTE — Telephone Encounter (Signed)
 Requesting: oxycodone  10mg   Contract: 06/27/22 UDS: 06/27/22 Last Visit: 05/03/23 Next Visit: 12/13/23 Last Refill: 10/03/23 #180 and 0RF   Please Advise

## 2023-10-31 ENCOUNTER — Other Ambulatory Visit: Payer: Self-pay | Admitting: Family Medicine

## 2023-10-31 MED ORDER — OXYCODONE HCL 10 MG PO TABS: 20.0000 mg | ORAL_TABLET | ORAL | 0 refills | Status: DC | PRN

## 2023-10-31 NOTE — Telephone Encounter (Signed)
 Looks like she has been seeing another primary care provider in [place] at [place]?

## 2023-10-31 NOTE — Telephone Encounter (Signed)
 Copied from CRM [id number]. Topic: Clinical - Medication Refill >> Oct 31, 2023  1:33 PM Baldo Levan wrote: Medication: Oxycodone  HCl Oxycodone  HCl 10 MG TABS   Has the patient contacted their pharmacy? Yes (Agent: If no, request that the patient contact the pharmacy for the refill. If patient does not wish to contact the pharmacy document the reason why and proceed with request.) (Agent: If yes, when and what did the pharmacy advise?)  This is the patient's preferred pharmacy:  [facility] - [place],  - [address] [facility] [place] [address] Phone: [phone] Fax: [phone]    Is this the correct pharmacy for this prescription? Yes If no, delete pharmacy and type the correct one.   Has the prescription been filled recently? No  Is the patient out of the medication? Yes - Patient has a few days left  Has the patient been seen for an appointment in the last year OR does the patient have an upcoming appointment? Yes  Can we respond through MyChart? Yes  Agent: Please be advised that Rx refills may take up to 3 business days. We ask that you follow-up with your pharmacy.

## 2023-11-02 ENCOUNTER — Other Ambulatory Visit: Payer: Self-pay | Admitting: Family Medicine

## 2023-11-14 ENCOUNTER — Other Ambulatory Visit: Payer: Self-pay | Admitting: Family Medicine

## 2023-11-14 MED ORDER — OXYCODONE HCL 10 MG PO TABS: 20.0000 mg | ORAL_TABLET | ORAL | 0 refills | Status: DC | PRN

## 2023-11-14 NOTE — Telephone Encounter (Signed)
 Requesting: oxycodone  10mg   Contract: 06/27/22 UDS: 06/27/22 Last Visit: 05/03/23 Next Visit: 12/13/23 Last Refill: 10/31/23 #180 and 0RF   Please Advise

## 2023-11-14 NOTE — Telephone Encounter (Signed)
 Copied from CRM [id number]. Topic: Clinical - Medication Refill >> Nov 14, 2023 11:46 AM Macario HERO wrote: Medication: Oxycodone  HCl 10 MG TABS [[id number]]  Has the patient contacted their pharmacy? No (Agent: If no, request that the patient contact the pharmacy for the refill. If patient does not wish to contact the pharmacy document the reason why and proceed with request.) (Agent: If yes, when and what did the pharmacy advise?)  This is the patient's preferred pharmacy:  [facility] - , [place] - [address] [facility] [place] [address] Phone: [phone] Fax: [phone]   Is this the correct pharmacy for this prescription? Yes If no, delete pharmacy and type the correct one.   Has the prescription been filled recently? Yes  Is the patient out of the medication? No, 2 days left  Has the patient been seen for an appointment in the last year OR does the patient have an upcoming appointment? Yes  Can we respond through MyChart? Yes  Agent: Please be advised that Rx refills may take up to 3 business days. We ask that you follow-up with your pharmacy.

## 2023-11-26 ENCOUNTER — Other Ambulatory Visit: Payer: Self-pay | Admitting: Family Medicine

## 2023-11-26 DIAGNOSIS — I10 Essential (primary) hypertension: Secondary | ICD-10-CM

## 2023-11-27 NOTE — Telephone Encounter (Signed)
 FYI- patient went to urgent care for her leg swelling and still a patient with Dr.Blyth. Patient has upcoming appointment on 12/13/23 @ 3 PM with Dr. Domenica.

## 2023-11-28 ENCOUNTER — Other Ambulatory Visit: Payer: Self-pay | Admitting: Family Medicine

## 2023-11-28 MED ORDER — OXYCODONE HCL 10 MG PO TABS: 20.0000 mg | ORAL_TABLET | ORAL | 0 refills | Status: DC | PRN

## 2023-11-28 NOTE — Telephone Encounter (Signed)
 Copied from CRM [id number]. Topic: Clinical - Medication Refill >> Nov 28, 2023  1:21 PM Suzen RAMAN wrote: Medication: Oxycodone  HCl 10 MG TABS  Has the patient contacted their pharmacy? Yes  This is the patient's preferred pharmacy:  [facility] - [place], [place] - [address] [facility] [place] [address] Phone: [phone] Fax: [phone]   Is this the correct pharmacy for this prescription? Yes If no, delete pharmacy and type the correct one.   Has the prescription been filled recently? No  Is the patient out of the medication? No  Has the patient been seen for an appointment in the last year OR does the patient have an upcoming appointment? Yes  Can we respond through MyChart? Yes  Agent: Please be advised that Rx refills may take up to 3 business days. We ask that you follow-up with your pharmacy.

## 2023-11-28 NOTE — Telephone Encounter (Signed)
 Requesting: oxycodone  10mg  Contract: 06/27/22 UDS: 06/27/22 Last Visit: 05/03/23 Next Visit: 12/13/23 Last Refill: 11/14/23 #180 and 0RF   Please Advise

## 2023-12-11 ENCOUNTER — Other Ambulatory Visit: Payer: Self-pay | Admitting: Family Medicine

## 2023-12-11 ENCOUNTER — Ambulatory Visit: Payer: Self-pay

## 2023-12-11 DIAGNOSIS — G894 Chronic pain syndrome: Secondary | ICD-10-CM

## 2023-12-11 DIAGNOSIS — Z79899 Other long term (current) drug therapy: Secondary | ICD-10-CM

## 2023-12-11 NOTE — Telephone Encounter (Signed)
 FYI Only or Action Required?: Action required by provider: clinical question for provider and update on patient condition.  Patient was last seen in primary care on 05/03/2023 by Domenica Harlene LABOR, MD.  Called Nurse Triage reporting Arm Pain.  Symptoms began several years ago.  Interventions attempted: Prescription medications: oxycodone  and Ice/heat application.  Symptoms are: gradually worsening.  Triage Disposition: See PCP Within 2 Weeks  Patient/caregiver understands and will follow disposition?: Yes    Copied from CRM [id number]. Topic: Clinical - Red Word Triage >> Dec 11, 2023 12:33 PM Jayma L wrote: Red Word that prompted transfer to Nurse Triage : patient called in stated she can't raise her right arm , stated she can barley move and over last few days its gotten worse Reason for Disposition  Shoulder pain is a chronic symptom (recurrent or ongoing AND present > 4 weeks)  Answer Assessment - Initial Assessment Questions 1. ONSET: When did the pain start?     Ongoing gotten worse 3-4 days ago but comes and goes 2. LOCATION: Where is the pain located?     Right  3. PAIN: How bad is the pain? (Scale 1-10; or mild, moderate, severe)     8/10 4. WORK OR EXERCISE: Has there been any recent work or exercise that involved this part of the body?     no 5. CAUSE: What do you think is causing the shoulder pain?     Rheumatoid arthritis 6. OTHER SYMPTOMS: Do you have any other symptoms? (e.g., neck pain, swelling, rash, fever, numbness, weakness)     weakness  Protocols used: Shoulder Pain-A-AH

## 2023-12-11 NOTE — Telephone Encounter (Signed)
 Copied from CRM [id number]. Topic: Clinical - Medication Refill >> Dec 11, 2023 12:29 PM Jayma L wrote: Medication:  Oxycodone  HCl 10 MG TABS   Has the patient contacted their pharmacy? No (Agent: If no, request that the patient contact the pharmacy for the refill. If patient does not wish to contact the pharmacy document the reason why and proceed with request.) (Agent: If yes, when and what did the pharmacy advise?)  This is the patient's preferred pharmacy:  [facility] - [place], [place] - [address] [facility] [place] [address] Phone: [phone] Fax: [phone]   Is this the correct pharmacy for this prescription? Yes If no, delete pharmacy and type the correct one.   Has the prescription been filled recently? Yes  Is the patient out of the medication? No  Has the patient been seen for an appointment in the last year OR does the patient have an upcoming appointment? Yes  Can we respond through MyChart? Yes  Agent: Please be advised that Rx refills may take up to 3 business days. We ask that you follow-up with your pharmacy.

## 2023-12-11 NOTE — Telephone Encounter (Signed)
 Pt states that she is having worsening pain for the last 3-4 days but her main concern is her appt that is scheduled for Thursday for hre UDS and contract but she is wondering can this appt be a video visit and if maybe she can do the UDS somewhere near her as she is having trouble getting a ride to the clinic on Thursday. States that her normal ride is unable to bring her and she is unsure if the other person will be able to.  Please contact pt regarding appt.

## 2023-12-12 NOTE — Addendum Note (Signed)
 Addended by: DORLENE CHIQUITA RAMAN on: 12/12/2023 04:15 PM   Modules accepted: Orders

## 2023-12-12 NOTE — Assessment & Plan Note (Signed)
 Monitor and report any concerns, no changes to meds. Encouraged heart healthy diet such as the DASH diet and exercise as tolerated.  ?

## 2023-12-12 NOTE — Assessment & Plan Note (Signed)
 Encouraged to get adequate exercise, calcium and vitamin d intake

## 2023-12-12 NOTE — Assessment & Plan Note (Signed)
 Encouraged DASH or MIND diet, decrease po intake and increase exercise as tolerated. Needs 7-8 hours of sleep nightly. Avoid trans fats, eat small, frequent meals every 4-5 hours with lean proteins, complex carbs and healthy fats. Minimize simple carbs, high fat foods and processed foods

## 2023-12-12 NOTE — Addendum Note (Signed)
 Addended by: PORTER SUZEN CROME on: 12/12/2023 11:40 AM   Modules accepted: Orders

## 2023-12-12 NOTE — Assessment & Plan Note (Signed)
 hgba1c acceptable, minimize simple carbs. Increase exercise as tolerated.

## 2023-12-12 NOTE — Telephone Encounter (Addendum)
 Pt notified .SABRA  Labs ordered for 10 panel drug screen panel -- lab [phone]  and faxed  [facility] [address] [place], [place] [address]    Number : [phone] Fax: [phone]

## 2023-12-12 NOTE — Telephone Encounter (Signed)
 Pt states that she would like her visit tomorrow switched to a VV. Would like medication refill for: oxycodone  10 MG tabs. Pt would like a phone call back when visit has been switched. Pt will call back when she has an address for a [facility] or [facility] lab.

## 2023-12-13 ENCOUNTER — Telehealth (INDEPENDENT_AMBULATORY_CARE_PROVIDER_SITE_OTHER): Payer: BLUE CROSS/BLUE SHIELD | Admitting: Family Medicine

## 2023-12-13 ENCOUNTER — Encounter: Payer: Self-pay | Admitting: Family Medicine

## 2023-12-13 DIAGNOSIS — M8580 Other specified disorders of bone density and structure, unspecified site: Secondary | ICD-10-CM

## 2023-12-13 DIAGNOSIS — R739 Hyperglycemia, unspecified: Secondary | ICD-10-CM

## 2023-12-13 DIAGNOSIS — I10 Essential (primary) hypertension: Secondary | ICD-10-CM | POA: Diagnosis not present

## 2023-12-13 DIAGNOSIS — Z1211 Encounter for screening for malignant neoplasm of colon: Secondary | ICD-10-CM

## 2023-12-13 DIAGNOSIS — Z79899 Other long term (current) drug therapy: Secondary | ICD-10-CM

## 2023-12-13 MED ORDER — OXYCODONE HCL 10 MG PO TABS
20.0000 mg | ORAL_TABLET | ORAL | 0 refills | Status: DC | PRN
Start: 2023-12-13 — End: 2023-12-25

## 2023-12-13 NOTE — Progress Notes (Signed)
 MyChart Video Visit    Virtual Visit via Video Note   This patient is at least at moderate risk for complications without adequate follow up. This format is felt to be most appropriate for this patient at this time. Physical exam was limited by quality of the video and audio technology used for the visit. [name], CMA was able to get the patient set up on a video visit.  Patient location: home. Patient and provider in visit Provider location: Office  I discussed the limitations of evaluation and management by telemedicine and the availability of in person appointments. The patient expressed understanding and agreed to proceed.  Visit Date: 12/13/2023  Today's healthcare provider: Harlene Horton, MD     Subjective:    Patient ID: Jennifer Kelly, female    DOB: Feb 20, 1961, 63 y.o.   MRN: [id number]  Chief Complaint  Patient presents with   Medical Management of Chronic Issues    Patient presents today for a 7 month follow-up.   Quality Metric Gaps    Pap, mammogram, colonoscopy, zoster, pneumococcal,zoster,    HPI Discussed the use of AI scribe software for clinical note transcription with the patient, who gave verbal consent to proceed.  History of Present Illness Jennifer Kelly is a 63 year old female with rheumatoid arthritis and lymphedema who presents with a severe flare-up and medication issues.  She reports a severe flare-up of her rheumatoid arthritis (RA) after her insurance changed her medication from Actemra to Tyenne, which she feels is not effective after eight weeks. She has been prescribed prednisone  to manage the flare-up. Her symptoms include pain and swelling in her hands, with visible deformities and puffiness, and a turned pinky toe. She is concerned about the progression of her RA, noting that her hands are turning and swelling.  She has a history of lymphedema, which has worsened since November. She attributes this to prolonged sitting due to back pain  and limited mobility. She experiences pressure sores from sitting and uses a cushion to alleviate them. Her legs have improved recently, but she remains mostly sedentary, sitting in a chair with her feet elevated.  She has a history of back issues, including three surgeries, and reports a significant decline in mobility since November. She has not left her apartment in a month and relies on [employer] for groceries. She experiences significant pain, which limits her ability to walk and perform daily activities. She sleeps in a chair due to pain when lying in bed, managing only three hours of sleep at a time before waking in pain.  She is currently taking diltiazem , hydrochlorothiazide , venlafaxine , and vitamin D (2000 IU daily). She recently received a 90-day supply of these medications. She is also on oxycodone  for pain management and has requested a refill, which is pending at [facility]. She has a friend who assists with picking up her medications.  She has a supportive brother and sister-in-law who have helped her significantly over the past few years, especially with her cancer and RA. She feels overwhelmed and in 'dire straits' due to her health conditions and limited mobility.  She has been approved for disability benefits, which she received on her first application. She is not yet on Medicare, as she is 63 years old, but she has health insurance through her previous job, although there are issues with payment processing.    Past Medical History:  Diagnosis Date   Acute bronchitis 01/05/2014   Arthritis  Chicken pox as a child   Dermatitis 09/09/2014   Ganglion cyst of left foot 09/09/2014   H/O mumps    H/O tobacco use, presenting hazards to health 10/08/2009   Qualifier: Diagnosis of  By: Harlow MD, Ozell BRAVO  Last cigarette 02/16/2016    History of chicken pox    Hyperglycemia 11/30/2015   Hypertension    Morbid obesity (HCC) 08/31/2009   Qualifier: Diagnosis of  By: Harlow MD,  Ozell BRAVO  BMI 34.4(Sept '13) BMI 34 (March '15)    Mumps as a child   Osteopenia 01/02/2017   Osteoporosis 11/20/2016   Psoriatic arthritis (HCC)    Rheumatoid arthritis (HCC)    Shingles 2014    Past Surgical History:  Procedure Laterality Date   BACK SURGERY  2003   NECK SURGERY  02/17/2016   C4, C5 & C6   rotater cuff  20 yrs ago   left shoulder   TOOTH EXTRACTION  05/2012   WISDOM TOOTH EXTRACTION  63 yrs old    Family History  Problem Relation Age of Onset   Parkinson's disease Mother    Heart disease Mother    Hypertension Mother    Diabetes Father        type 2   Hypertension Father    Stroke Father    Vascular Disease Brother    Hypertension Brother    Diabetes Brother    Heart attack Paternal Uncle     Social History   Socioeconomic History   Marital status: Divorced    Spouse name: Not on file   Number of children: 0   Years of education: Not on file   Highest education level: Not on file  Occupational History   Occupation: help Youth worker  Tobacco Use   Smoking status: Former    Types: Cigarettes    Start date: 11/30/2015   Smokeless tobacco: Never  Substance and Sexual Activity   Alcohol use: Yes    Comment: occ wine   Drug use: No   Sexual activity: Never    Comment: no dietary restrictions. lives alone  Other Topics Concern   Not on file  Social History Narrative   Not on file   Social Drivers of Health   Financial Resource Strain: Not on file  Food Insecurity: Not on file  Transportation Needs: Not on file  Physical Activity: Not on file  Stress: Not on file  Social Connections: Not on file  Intimate Partner Violence: Not on file    Outpatient Medications Prior to Visit  Medication Sig Dispense Refill   ACTEMRA ACTPEN 162 MG/0.9ML SOAJ Inject into the skin.     albuterol  (VENTOLIN  HFA) 108 (90 Base) MCG/ACT inhaler INHALE 2 PUFFS BY MOUTH EVERY 6 HOURS AS NEEDED FOR WHEEZE OR SHORTNESS OF BREATH 8.5 each 5   celecoxib  (CELEBREX) 200 MG capsule Take 200 mg by mouth in the morning and at bedtime.     citalopram  (CELEXA ) 20 MG tablet TAKE 1 TABLET (20MG ) BY MOUTH EVERY DAY 90 tablet 1   diltiazem  (TIAZAC ) 240 MG 24 hr capsule TAKE 1 CAPSULE BY MOUTH EVERY DAY 90 capsule 1   fentaNYL  (DURAGESIC ) 75 MCG/HR Place 1 patch onto the skin every 3 (three) days. 10 patch 0   hydrochlorothiazide  (MICROZIDE ) 12.5 MG capsule TAKE 1 CAPSULE (12.5MG ) BY MOUTH EVERY DAY 90 capsule 1   losartan  (COZAAR ) 50 MG tablet TAKE 1 TABLET BY MOUTH EVERY DAY 90 tablet 1   mupirocin  ointment (  BACTROBAN ) 2 % PLACE 1 APPLICATION INTO THE NOSE DAILY. VIA Q TIP 22 g 0   naloxone  (NARCAN ) nasal spray 4 mg/0.1 mL I dose sprayed in one nostril every 2-3 minutes until patient is responsive, awake or EMS arrives. 1 each 2   predniSONE  (DELTASONE ) 20 MG tablet Take 2 tablets (40mg ) by mouth every day x 4 days. Take as needed for flares.     Tocilizumab-aazg (TYENNE) 162 MG/0.9ML SOSY Inject into the skin.     venlafaxine  XR (EFFEXOR -XR) 75 MG 24 hr capsule TAKE 1 CAPSULE BY MOUTH EVERY DAY 90 capsule 1   Oxycodone  HCl 10 MG TABS Take 2 tablets (20 mg total) by mouth every 4 (four) hours as needed. 180 tablet 0   No facility-administered medications prior to visit.    Allergies  Allergen Reactions   Simponi [Golimumab] Rash    States that it was systemic   Erythromycin     Upset stomach    Review of Systems  Constitutional:  Positive for malaise/fatigue. Negative for fever.  HENT:  Negative for congestion.   Eyes:  Negative for blurred vision.  Respiratory:  Negative for shortness of breath.   Cardiovascular:  Negative for chest pain, palpitations and leg swelling.  Gastrointestinal:  Negative for abdominal pain, blood in stool and nausea.  Genitourinary:  Negative for dysuria and frequency.  Musculoskeletal:  Positive for back pain, joint pain and myalgias. Negative for falls.  Skin:  Negative for rash.  Neurological:  Negative for  dizziness, loss of consciousness and headaches.  Endo/Heme/Allergies:  Negative for environmental allergies.  Psychiatric/Behavioral:  Negative for depression. The patient is nervous/anxious.        Objective:    Physical Exam Constitutional:      General: She is not in acute distress.    Appearance: Normal appearance. She is not ill-appearing or toxic-appearing.  HENT:     Head: Normocephalic and atraumatic.     Right Ear: External ear normal.     Left Ear: External ear normal.     Nose: Nose normal.  Eyes:     General:        Right eye: No discharge.        Left eye: No discharge.  Pulmonary:     Effort: Pulmonary effort is normal.  Skin:    Findings: No rash.  Neurological:     Mental Status: She is alert and oriented to person, place, and time.  Psychiatric:        Behavior: Behavior normal.    There were no vitals taken for this visit. Wt Readings from Last 3 Encounters:  05/03/23 246 lb (111.6 kg)  06/27/22 245 lb (111.1 kg)  08/16/21 235 lb (106.6 kg)       Assessment & Plan:  Hyperglycemia Assessment & Plan: hgba1c acceptable, minimize simple carbs. Increase exercise as tolerated.   Orders: -     Lipid panel; Future -     TSH; Future -     Hemoglobin A1c; Future  Primary hypertension Assessment & Plan: Monitor and report any concerns, no changes to meds. Encouraged heart healthy diet such as the DASH diet and exercise as tolerated.    Orders: -     Comprehensive metabolic panel with GFR; Future -     CBC with Differential/Platelet; Future  Morbid obesity (HCC) Assessment & Plan: Encouraged DASH or MIND diet, decrease po intake and increase exercise as tolerated. Needs 7-8 hours of sleep nightly. Avoid trans fats, eat small,  frequent meals every 4-5 hours with lean proteins, complex carbs and healthy fats. Minimize simple carbs, high fat foods and processed foods    Osteopenia, unspecified location Assessment & Plan: Encouraged to get adequate  exercise, calcium and vitamin d intake    Screening for colon cancer -     Ambulatory referral to Gastroenterology  High risk medication use -     Urine drugs of abuse scrn w alc, routine (Ref Lab); Future  Other orders -     oxyCODONE  HCl; Take 2 tablets (20 mg total) by mouth every 4 (four) hours as needed.  Dispense: 180 tablet; Refill: 0     Assessment and Plan Assessment & Plan Rheumatoid arthritis flare Severe flare due to medication change from Actemra to Tyenne. Significant pain and swelling in hands and feet with deformities. Concerns about disease progression. - Continue prednisone . - Consult rheumatologist for medication adjustments. - Consider referral to skilled nursing facility for rehabilitation and daily assistance.  Chronic pain syndrome Chronic pain impacts daily functioning, leading to sedentary lifestyle and complications like pressure sores. - Refill oxycodone . - Encourage mobility as tolerated. - Consider skilled nursing facility for rehabilitation.  Lymphedema Recurrent leg swelling with management difficulties due to limited mobility. Potential vascular involvement related to rheumatoid arthritis. - Consider increasing hydrochlorothiazide  to 25 mg if worsens. - Consult rheumatologist about vascular involvement.  General Health Maintenance Due for colon cancer screening. Previous screenings negative. Discussed Cologuard as alternative, repeatable in three years if negative. - Order Cologuard test.  Follow-up Plans for continuity of care and condition monitoring. - Schedule follow-up in 3-6 months. - Complete lab work at [facility] for kidney function and anemia. - Coordinate with rheumatologist for rheumatoid arthritis management.     I discussed the assessment and treatment plan with the patient. The patient was provided an opportunity to ask questions and all were answered. The patient agreed with the plan and demonstrated an understanding of the  instructions.   The patient was advised to call back or seek an in-person evaluation if the symptoms worsen or if the condition fails to improve as anticipated.  Harlene Horton, MD [facility] at [facility] [phone] (phone) [phone] (fax)  [facility]

## 2023-12-25 ENCOUNTER — Other Ambulatory Visit: Payer: Self-pay | Admitting: Family Medicine

## 2023-12-25 NOTE — Telephone Encounter (Unsigned)
 Copied from CRM #[id number]. Topic: Clinical - Medication Refill >> Dec 25, 2023  2:35 PM Gennette ORN wrote: Medication:  Oxycodone  HCl 10 MG TABS   Has the patient contacted their pharmacy? Yes (Agent: If no, request that the patient contact the pharmacy for the refill. If patient does not wish to contact the pharmacy document the reason why and proceed with request.) (Agent: If yes, when and what did the pharmacy advise?)  This is the patient's preferred pharmacy:  [id number] - [place], [place] - [address] [facility] [place] [address] Phone: [phone] Fax: [phone]  Is this the correct pharmacy for this prescription? Yes If no, delete pharmacy and type the correct one.   Has the prescription been filled recently? Yes  Is the patient out of the medication? No  Has the patient been seen for an appointment in the last year OR does the patient have an upcoming appointment? Yes  Can we respond through MyChart? Yes  Agent: Please be advised that Rx refills may take up to 3 business days. We ask that you follow-up with your pharmacy.

## 2023-12-25 NOTE — Telephone Encounter (Signed)
 Requesting: Oxycodone  10 MG Contract: 06/27/22 UDS: 06/27/2022 Last Visit: 12/13/23 Next Visit: none Last Refill: 12/13/23  Please Advise

## 2023-12-26 LAB — URINE DRUGS OF ABUSE SCREEN W ALC, ROUTINE (REF LAB)

## 2023-12-26 LAB — MED LIST OPTION NOT SELECTED

## 2023-12-27 ENCOUNTER — Telehealth: Payer: Self-pay

## 2023-12-27 MED ORDER — OXYCODONE HCL 10 MG PO TABS: 20.0000 mg | ORAL_TABLET | ORAL | 0 refills | Status: DC | PRN

## 2023-12-27 NOTE — Telephone Encounter (Signed)
 Copied from CRM [id number]. Topic: Clinical - Medication Refill >> Dec 27, 2023 10:10 AM Jennifer Kelly ORN wrote: Medication: Oxycodone  HCl 10 MG TABS  Has the patient contacted their pharmacy? No (Agent: If no, request that the patient contact the pharmacy for the refill. If patient does not wish to contact the pharmacy document the reason why and proceed with request.) (Agent: If yes, when and what did the pharmacy advise?)  This is the patient's preferred pharmacy:  [id number] - [place], [place] - [address] [facility] [place] [address] Phone: [phone] Fax: [phone]  Is this the correct pharmacy for this prescription? Yes If no, delete pharmacy and type the correct one.   Has the prescription been filled recently? No  Is the patient out of the medication? Yes  Has the patient been seen for an appointment in the last year OR does the patient have an upcoming appointment? Yes  Can we respond through MyChart? Yes  Agent: Please be advised that Rx refills may take up to 3 business days. We ask that you follow-up with your pharmacy. >> Dec 27, 2023  3:47 PM Jennifer Kelly SQUIBB wrote: Patient is calling in regarding the medication, patient stated she called in on 08/05 and has not heard anything regarding the medication. Patient stated she is out of the medication and would like this today if possible since it has been a few days. Please advise patient.

## 2023-12-27 NOTE — Telephone Encounter (Unsigned)
 Copied from CRM [id number]. Topic: Clinical - Medication Refill >> Dec 27, 2023 10:10 AM Harlene ORN wrote: Medication: Oxycodone  HCl 10 MG TABS  Has the patient contacted their pharmacy? No (Agent: If no, request that the patient contact the pharmacy for the refill. If patient does not wish to contact the pharmacy document the reason why and proceed with request.) (Agent: If yes, when and what did the pharmacy advise?)  This is the patient's preferred pharmacy:  [id number] - [place], [place] - [address] [facility] [place] [address] Phone: [phone] Fax: [phone]  Is this the correct pharmacy for this prescription? Yes If no, delete pharmacy and type the correct one.   Has the prescription been filled recently? No  Is the patient out of the medication? Yes  Has the patient been seen for an appointment in the last year OR does the patient have an upcoming appointment? Yes  Can we respond through MyChart? Yes  Agent: Please be advised that Rx refills may take up to 3 business days. We ask that you follow-up with your pharmacy.

## 2023-12-27 NOTE — Telephone Encounter (Signed)
 Requesting: Oxycodone  10 MG Contract: 06/27/22 UDS: 12/25/23 Last Visit: 12/13/23 Next Visit: none Last Refill: 12/13/23  Please Advise

## 2023-12-28 LAB — PMP SCREEN PROFILE (10S), URINE
Creatinine(Crt), U: 76.1 mg/dL (ref 20.0–300.0)
Ph of Urine: 5.7 (ref 4.5–8.9)

## 2023-12-31 ENCOUNTER — Ambulatory Visit: Payer: Self-pay | Admitting: Family Medicine

## 2023-12-31 DIAGNOSIS — D696 Thrombocytopenia, unspecified: Secondary | ICD-10-CM

## 2024-01-01 LAB — CBC WITH DIFFERENTIAL/PLATELET
Basophils Absolute: 0 x10E3/uL (ref 0.0–0.2)
Basos: 1 %
EOS (ABSOLUTE): 0.2 x10E3/uL (ref 0.0–0.4)
Eos: 3 %
Hematocrit: 47.4 % — ABNORMAL HIGH (ref 34.0–46.6)
Hemoglobin: 14.8 g/dL (ref 11.1–15.9)
Immature Grans (Abs): 0 x10E3/uL (ref 0.0–0.1)
Immature Granulocytes: 0 %
Lymphocytes Absolute: 2 x10E3/uL (ref 0.7–3.1)
Lymphs: 29 %
MCH: 28 pg (ref 26.6–33.0)
MCHC: 31.2 g/dL — ABNORMAL LOW (ref 31.5–35.7)
MCV: 90 fL (ref 79–97)
Monocytes Absolute: 0.9 x10E3/uL (ref 0.1–0.9)
Monocytes: 13 %
Neutrophils Absolute: 3.8 x10E3/uL (ref 1.4–7.0)
Neutrophils: 54 %
Platelets: 148 x10E3/uL — ABNORMAL LOW (ref 150–450)
RBC: 5.28 x10E6/uL (ref 3.77–5.28)
RDW: 14.7 % (ref 11.7–15.4)
WBC: 6.9 x10E3/uL (ref 3.4–10.8)

## 2024-01-01 LAB — URINE DRUGS OF ABUSE SCREEN W ALC, ROUTINE (REF LAB)
Amphetamines, Urine: NEGATIVE ng/mL
Barbiturate Quant, Ur: NEGATIVE ng/mL
Benzodiazepine Quant, Ur: NEGATIVE ng/mL
Cannabinoid Quant, Ur: NEGATIVE ng/mL
Cocaine (Metab.): NEGATIVE ng/mL
Creatinine, Urine: 76.1 mg/dL (ref 20.0–300.0)
Ethanol, Urine: NEGATIVE %
Methadone Screen, Urine: NEGATIVE ng/mL
Nitrite Urine, Quantitative: NEGATIVE ug/mL
PCP Quant, Ur: NEGATIVE ng/mL
Propoxyphene: NEGATIVE ng/mL
pH, Urine: 5.7 (ref 4.5–8.9)

## 2024-01-01 LAB — HEMOGLOBIN A1C
Est. average glucose Bld gHb Est-mCnc: 120 mg/dL
Hgb A1c MFr Bld: 5.8 % — ABNORMAL HIGH (ref 4.8–5.6)

## 2024-01-01 LAB — PMP SCREEN PROFILE (10S), URINE

## 2024-01-01 LAB — COMPREHENSIVE METABOLIC PANEL WITH GFR
ALT: 28 IU/L (ref 0–32)
AST: 25 IU/L (ref 0–40)
Albumin: 4.2 g/dL (ref 3.9–4.9)
Alkaline Phosphatase: 72 IU/L (ref 44–121)
BUN/Creatinine Ratio: 15 (ref 12–28)
BUN: 14 mg/dL (ref 8–27)
Bilirubin Total: 0.8 mg/dL (ref 0.0–1.2)
CO2: 25 mmol/L (ref 20–29)
Calcium: 9.4 mg/dL (ref 8.7–10.3)
Chloride: 98 mmol/L (ref 96–106)
Creatinine, Ser: 0.91 mg/dL (ref 0.57–1.00)
Globulin, Total: 2.8 g/dL (ref 1.5–4.5)
Glucose: 102 mg/dL — ABNORMAL HIGH (ref 70–99)
Potassium: 3.7 mmol/L (ref 3.5–5.2)
Sodium: 139 mmol/L (ref 134–144)
Total Protein: 7 g/dL (ref 6.0–8.5)
eGFR: 70 mL/min/1.73 (ref >59)

## 2024-01-01 LAB — TSH: TSH: 1.59 u[IU]/mL (ref 0.450–4.500)

## 2024-01-01 LAB — OPIATES CONFIRMATION, URINE: Opiates: NEGATIVE

## 2024-01-04 NOTE — Progress Notes (Signed)
 Called patient to schedule lab recheck and no answer left vm to return call.

## 2024-01-06 ENCOUNTER — Other Ambulatory Visit: Payer: Self-pay | Admitting: Family Medicine

## 2024-01-06 MED ORDER — OXYCODONE HCL 10 MG PO TABS
20.0000 mg | ORAL_TABLET | ORAL | 0 refills | Status: DC | PRN
Start: 2024-01-06 — End: 2024-01-17

## 2024-01-17 ENCOUNTER — Other Ambulatory Visit: Payer: Self-pay | Admitting: Family Medicine

## 2024-01-17 NOTE — Telephone Encounter (Signed)
 Copied from CRM #[id number]. Topic: Clinical - Medication Question >> Jan 17, 2024  3:47 PM Aisha D wrote: Reason for CRM: Pt is returning a missed call from [facility] in regards to the correct pharmacy for the medication refill request. Pt stated that she uses [id number] - [place], [place] - [address] [facility] [place] [address] Phone: [phone] Fax: [phone]

## 2024-01-17 NOTE — Telephone Encounter (Unsigned)
 Copied from CRM [id number]. Topic: Clinical - Medication Refill >> Jan 17, 2024  2:59 PM Berneda F wrote: Medication:  Oxycodone  HCl 10 MG TABS   Has the patient contacted their pharmacy? Yes (Agent: If no, request that the patient contact the pharmacy for the refill. If patient does not wish to contact the pharmacy document the reason why and proceed with request.) (Agent: If yes, when and what did the pharmacy advise?)  This is the patient's preferred pharmacy:  [id number] - [place], [place] - [address] [facility] [place] [address] Phone: [phone] Fax: [phone]  [id number] - [place], [place] - [address] [facility] [place] [address] Phone: [phone] Fax: [phone]  They are closed on Sunday and closed Monday for the holiday  Is this the correct pharmacy for this prescription? Yes If no, delete pharmacy and type the correct one.   Has the prescription been filled recently? No  Is the patient out of the medication? No  Has the patient been seen for an appointment in the last year OR does the patient have an upcoming appointment? Yes  Can we respond through MyChart? Yes  Agent: Please be advised that Rx refills may take up to 3 business days. We ask that you follow-up with your pharmacy.

## 2024-01-17 NOTE — Telephone Encounter (Signed)
 LVM for patient to clarify correct pharmacy

## 2024-01-18 MED ORDER — OXYCODONE HCL 10 MG PO TABS: 20.0000 mg | ORAL_TABLET | ORAL | 0 refills | Status: DC | PRN

## 2024-01-18 NOTE — Telephone Encounter (Signed)
 Requesting: Oxycodone  10 MG Contract: 06/27/22 UDS: 12/25/23 Last Visit: 12/13/23 Next Visit: Visit date not found Last Refill: 01/06/24  Please Advise

## 2024-01-30 ENCOUNTER — Other Ambulatory Visit: Payer: Self-pay | Admitting: Family Medicine

## 2024-01-30 MED ORDER — OXYCODONE HCL 10 MG PO TABS: 20.0000 mg | ORAL_TABLET | ORAL | 0 refills | Status: DC | PRN

## 2024-01-30 NOTE — Telephone Encounter (Signed)
 Requesting: Oxycodone  10 MG Contract: 06/27/2022 UDS: 12/25/2023 Last Visit: 12/13/2023 Next Visit: Visit date not found Last Refill: 01/18/2024  Please Advise

## 2024-01-30 NOTE — Telephone Encounter (Unsigned)
 Copied from CRM #[id number]. Topic: Clinical - Medication Refill >> Jan 30, 2024  4:33 PM Delon T wrote: Medication: Oxycodone  HCl 10 MG TABS  Has the patient contacted their pharmacy? No (Agent: If no, request that the patient contact the pharmacy for the refill. If patient does not wish to contact the pharmacy document the reason why and proceed with request.) (Agent: If yes, when and what did the pharmacy advise?)  This is the patient's preferred pharmacy:  [facility] - [place], [place] - [address] [facility] [place] [address] Phone: [phone] Fax: [phone]  Is this the correct pharmacy for this prescription? Yes If no, delete pharmacy and type the correct one.   Has the prescription been filled recently? Yes  Is the patient out of the medication? Yes  Has the patient been seen for an appointment in the last year OR does the patient have an upcoming appointment? Yes  Can we respond through MyChart? Yes  Agent: Please be advised that Rx refills may take up to 3 business days. We ask that you follow-up with your pharmacy.

## 2024-02-13 ENCOUNTER — Other Ambulatory Visit: Payer: Self-pay | Admitting: Family Medicine

## 2024-02-13 MED ORDER — OXYCODONE HCL 10 MG PO TABS: 20.0000 mg | ORAL_TABLET | ORAL | 0 refills | Status: DC | PRN

## 2024-02-13 NOTE — Telephone Encounter (Signed)
 Copied from CRM #[id number]. Topic: Clinical - Medication Refill >> Feb 13, 2024  1:01 PM Burnard DEL wrote: Medication: Oxycodone  HCl 10 MG TABS  Has the patient contacted their pharmacy? No (Agent: If no, request that the patient contact the pharmacy for the refill. If patient does not wish to contact the pharmacy document the reason why and proceed with request.) (Agent: If yes, when and what did the pharmacy advise?)  This is the patient's preferred pharmacy:  [facility] - [place], [place] - [address] [facility] [place] [address] Phone: [phone] Fax: [phone]  Is this the correct pharmacy for this prescription? Yes If no, delete pharmacy and type the correct one.   Has the prescription been filled recently? No  Is the patient out of the medication? No  Has the patient been seen for an appointment in the last year OR does the patient have an upcoming appointment? Yes  Can we respond through MyChart? Yes  Agent: Please be advised that Rx refills may take up to 3 business days. We ask that you follow-up with your pharmacy.

## 2024-02-13 NOTE — Telephone Encounter (Signed)
 Requesting: Oxycodone  10 MG Contract: 06/27/2022 UDS: 12/25/2023 Last Visit: 12/13/2023 Next Visit: Visit date not found Last Refill: 01/30/24 #180 and 0RF    Please Advise

## 2024-02-15 ENCOUNTER — Ambulatory Visit: Payer: Self-pay

## 2024-02-15 NOTE — Telephone Encounter (Signed)
 Copied from CRM #[id number]. Topic: Clinical - Medical Advice >> Feb 15, 2024 10:08 AM [name] wrote: Reason for CRM: Patient stated her rash under her boobs came back and now it spread to her arm . She stated Dr.Blyth gave her a medication for it before and she wants to know if she prescribe her that again please. She wants her nurse Cherise to call her. Callback number [phone] Reason for Disposition  Localized rash present > 7 days  Answer Assessment - Initial Assessment Questions Request to PCP: rx diflucan  and topical yeast cream [facility]. [place]. Requests call back asap if appointment will be required.   Additional info:  1) Patient declined acute visit due to distance from home to office, she is willing for video visit or can send pictures if Dr. Domenica feels appointment is necessary. She states her symptoms are intermittently ongoing for years. She is keeping the area clean and dry as possible. Requesting diflucan  and any cream Dr. Domenica feels is necessary [facility]. [facility]

## 2024-02-15 NOTE — Telephone Encounter (Signed)
 FYI Only or Action Required?: Action required by provider: new med request.  Patient was last seen in primary care on 12/13/2023 by Jennifer Harlene LABOR, MD.  Called Nurse Triage reporting Rash.  Symptoms began a week ago.  Interventions attempted: Other: keeping area clean and dry.  Symptoms are: unchanged.  Triage Disposition: See PCP When Office is Open (Within 3 Days)  Patient/caregiver understands and will follow disposition?: No, wishes to speak with PCP  Copied from CRM #[id number]. Topic: Clinical - Medical Advice >> Feb 15, 2024 10:08 AM [name] wrote: Reason for CRM: Patient stated her rash under her boobs came back and now it spread to her arm . She stated Dr.Blyth gave her a medication for it before and she wants to know if she prescribe her that again please. She wants her nurse Cherise to call her. Callback number [phone] Reason for Disposition  Localized rash present > 7 days  Answer Assessment - Initial Assessment Questions Request to PCP: rx diflucan  and topical yeast cream [facility]. [place]. Requests call back asap if appointment will be required.   Additional info:  1) Patient declined acute visit due to distance from home to office, she is willing for video visit or can send pictures if Dr. Domenica feels appointment is necessary. She states her symptoms are intermittently ongoing for years. She is keeping the area clean and dry as possible. Requesting diflucan  and any cream Dr. [facility]omen[facility] feels [facility] necessary [facility]. [place]     1. APPEARANCE of RASH: What does the rash look like? (e.g., blisters, dry flaky skin, red spots, redness, sores)     Red moist  2. LOCATION: Where is the rash located?      Under breast and left arm axilla 3. NUMBER: How many spots are there?      2 4. SIZE: How big are the spots? (e.g., inches, cm; or compare to size of pinhead, tip of pen, eraser, pea)      Under both breast 5. ONSET: When did the rash  start?      Intermittently ongoing for several years  6. ITCHING: Does the rash itch? If Yes, ask: How bad is the itch?  (Scale 0-10; or none, mild, moderate, severe)      7. PAIN: Does the rash hurt? If Yes, ask: How bad is the pain?  (Scale 0-10; or none, mild, moderate, severe)      8. OTHER SYMPTOMS: Do you have any other symptoms? (e.g., fever)     Denies  Protocols used: Rash or Redness - Localized-A-AH

## 2024-02-17 ENCOUNTER — Other Ambulatory Visit: Payer: Self-pay | Admitting: Family Medicine

## 2024-02-17 DIAGNOSIS — L309 Dermatitis, unspecified: Secondary | ICD-10-CM

## 2024-02-17 MED ORDER — NYSTATIN 100000 UNIT/GM EX CREA: 1.0000 | TOPICAL_CREAM | Freq: Two times a day (BID) | CUTANEOUS | 0 refills | Status: DC | PRN

## 2024-02-17 MED ORDER — NYSTATIN 100000 UNIT/GM EX CREA: 1.0000 | TOPICAL_CREAM | Freq: Two times a day (BID) | CUTANEOUS | 0 refills | Status: AC | PRN

## 2024-02-17 MED ORDER — FLUCONAZOLE 150 MG PO TABS: 150.0000 mg | ORAL_TABLET | ORAL | 1 refills | Status: DC

## 2024-02-18 NOTE — Telephone Encounter (Signed)
 Patient notified and she has already picked up medications.

## 2024-02-26 ENCOUNTER — Other Ambulatory Visit: Payer: Self-pay | Admitting: Family Medicine

## 2024-02-26 NOTE — Telephone Encounter (Unsigned)
 Copied from CRM [id number]. Topic: Clinical - Medication Refill >> Feb 26, 2024 11:46 AM Deaijah H wrote: Medication: Oxycodone  HCl 10 MG TABS  Has the patient contacted their pharmacy? Yes (Agent: If no, request that the patient contact the pharmacy for the refill. If patient does not wish to contact the pharmacy document the reason why and proceed with request.) Stated they don't do that (Agent: If yes, when and what did the pharmacy advise?)  This is the patient's preferred pharmacy:  [facility] - [place], [place] - [address] [facility] [place] [address] Phone: [phone] Fax: [phone]  Is this the correct pharmacy for this prescription? Yes If no, delete pharmacy and type the correct one.   Has the prescription been filled recently? Yes  Is the patient out of the medication? No  Has the patient been seen for an appointment in the last year OR does the patient have an upcoming appointment? Yes  Can we respond through MyChart? Yes  Agent: Please be advised that Rx refills may take up to 3 business days. We ask that you follow-up with your pharmacy.

## 2024-02-28 MED ORDER — OXYCODONE HCL 10 MG PO TABS: 20.0000 mg | ORAL_TABLET | ORAL | 0 refills | Status: DC | PRN

## 2024-02-28 NOTE — Telephone Encounter (Signed)
 Copied from CRM [id number]. Topic: Clinical - Medication Refill >> Feb 26, 2024 11:46 AM Deaijah H wrote: Medication: Oxycodone  HCl 10 MG TABS  Has the patient contacted their pharmacy? Yes (Agent: If no, request that the patient contact the pharmacy for the refill. If patient does not wish to contact the pharmacy document the reason why and proceed with request.) Stated they don't do that (Agent: If yes, when and what did the pharmacy advise?)  This is the patient's preferred pharmacy:  [facility] - [place], [place] - [address] [facility] [place] [address] Phone: [phone] Fax: [phone]  Is this the correct pharmacy for this prescription? Yes If no, delete pharmacy and type the correct one.   Has the prescription been filled recently? Yes  Is the patient out of the medication? No  Has the patient been seen for an appointment in the last year OR does the patient have an upcoming appointment? Yes  Can we respond through MyChart? Yes  Agent: Please be advised that Rx refills may take up to 3 business days. We ask that you follow-up with your pharmacy. >> Feb 28, 2024 10:41 AM Roselie BROCKS wrote: Patient is calling back about prescription for Oxycodone , she will be out today and needs it sent in

## 2024-02-28 NOTE — Telephone Encounter (Signed)
 Requesting: oxycodone  Contract:06/27/22 UDS: 12/25/23 Last Visit: 12/13/23 Next Visit: None Last Refill: 02/13/24 #180 and 0RF   Please Advise

## 2024-03-07 ENCOUNTER — Other Ambulatory Visit: Payer: Self-pay | Admitting: Family Medicine

## 2024-03-11 ENCOUNTER — Other Ambulatory Visit: Payer: Self-pay | Admitting: Family Medicine

## 2024-03-11 NOTE — Telephone Encounter (Unsigned)
 Copied from CRM [id number]. Topic: Clinical - Medication Refill >> Mar 11, 2024  4:35 PM [name] wrote: Medication: Oxycodone  HCl 10 MG TABS  Has the patient contacted their pharmacy? No (Agent: If no, request that the patient contact the pharmacy for the refill. If patient does not wish to contact the pharmacy document the reason why and proceed with request.) (Agent: If yes, when and what did the pharmacy advise?)  This is the patient's preferred pharmacy:  [facility] - [place], [place] - [address] [facility] [place] [address] Phone: [phone] Fax: [phone]    Is this the correct pharmacy for this prescription? Yes If no, delete pharmacy and type the correct one.   Has the prescription been filled recently? No  Is the patient out of the medication? No  Has the patient been seen for an appointment in the last year OR does the patient have an upcoming appointment? Yes  Can we respond through MyChart? Yes  Agent: Please be advised that Rx refills may take up to 3 business days. We ask that you follow-up with your pharmacy.

## 2024-03-12 MED ORDER — OXYCODONE HCL 10 MG PO TABS: 20.0000 mg | ORAL_TABLET | ORAL | 0 refills | Status: DC | PRN

## 2024-03-12 NOTE — Telephone Encounter (Signed)
 Requesting: oxycodone  10mg   Contract: 06/27/22 UDS: 12/25/23 Last Visit: 12/13/23 Next Visit: None Last Refill: 02/28/24 #180 and 0RF  Pt sig; 2 tab q4h prn  Please Advise

## 2024-03-24 ENCOUNTER — Other Ambulatory Visit: Payer: Self-pay | Admitting: Family Medicine

## 2024-03-24 ENCOUNTER — Telehealth: Payer: Self-pay | Admitting: Family Medicine

## 2024-03-24 MED ORDER — OXYCODONE HCL 10 MG PO TABS: 20.0000 mg | ORAL_TABLET | ORAL | 0 refills | Status: DC | PRN

## 2024-03-24 NOTE — Telephone Encounter (Signed)
 Returned pt's call and she was advised that lab order is already placed as future for [facility].  Requesting: Oxycodone  10MG  Contract: 06/27/2022 UDS: 12/25/2023 Last Visit: 12/13/2023 Next Visit: Visit date not found Last Refill: 03/12/2024  Please Advise

## 2024-03-24 NOTE — Telephone Encounter (Signed)
 Copied from CRM [id number]. Topic: Clinical - Request for Lab/Test Order >> Mar 24, 2024  3:16 PM Ashley R wrote: Reason for CRM: Requesting more lab work from previous results. States the was discussed previously with CMA  Called pt to schedule labs, she stated she gets them drawn at [facility] in [place] and that Cherise is always the one who schedules these for her. Please call pt to schedule.

## 2024-03-24 NOTE — Telephone Encounter (Unsigned)
 Copied from CRM [id number]. Topic: Clinical - Medication Refill >> Mar 24, 2024  3:13 PM Ashley R wrote: Medication: Oxycodone  HCl 10 MG TABS   Has the patient contacted their pharmacy? Yes   This is the patient's preferred pharmacy:  [facility] - [place], [place] - [address] [facility] [place] [address] Phone: [phone] Fax: [phone]   Is this the correct pharmacy for this prescription? Yes   Has the prescription been filled recently? Yes  Is the patient out of the medication? No  Has the patient been seen for an appointment in the last year OR does the patient have an upcoming appointment? Yes  Can we respond through MyChart? Yes  Agent: Please be advised that Rx refills may take up to 3 business days. We ask that you follow-up with your pharmacy.

## 2024-03-28 NOTE — Telephone Encounter (Signed)
Pt has pending labs

## 2024-04-04 ENCOUNTER — Other Ambulatory Visit: Payer: Self-pay | Admitting: Family Medicine

## 2024-04-04 MED ORDER — OXYCODONE HCL 10 MG PO TABS: 20.0000 mg | ORAL_TABLET | ORAL | 0 refills | Status: DC | PRN

## 2024-04-04 NOTE — Telephone Encounter (Signed)
 Copied from CRM #[id number]. Topic: Clinical - Medication Refill >> Apr 04, 2024  2:15 PM Franky GRADE wrote: Medication: Oxycodone  HCl 10 MG TABS [[id number]]  Has the patient contacted their pharmacy? No (Agent: If no, request that the patient contact the pharmacy for the refill. If patient does not wish to contact the pharmacy document the reason why and proceed with request.) (Agent: If yes, when and what did the pharmacy advise?)  This is the patient's preferred pharmacy:  [facility] - [place], [place] - [address] [facility] [place] [address] Phone: [phone] Fax: [phone]    Is this the correct pharmacy for this prescription? Yes If no, delete pharmacy and type the correct one.   Has the prescription been filled recently? No  Is the patient out of the medication? No  Has the patient been seen for an appointment in the last year OR does the patient have an upcoming appointment? Yes  Can we respond through MyChart? Yes  Agent: Please be advised that Rx refills may take up to 3 business days. We ask that you follow-up with your pharmacy.

## 2024-04-04 NOTE — Telephone Encounter (Signed)
 Requesting: oxycodone  10mg  Contract: 06/27/22 UDS: 12/25/23 Last Visit: 12/13/23 Next Visit: None Last Refill: 03/24/24 #180 and 0RF   Please Advise

## 2024-04-13 ENCOUNTER — Other Ambulatory Visit: Payer: Self-pay | Admitting: Family Medicine

## 2024-04-13 DIAGNOSIS — I10 Essential (primary) hypertension: Secondary | ICD-10-CM

## 2024-04-16 ENCOUNTER — Other Ambulatory Visit: Payer: Self-pay | Admitting: Family Medicine

## 2024-04-16 NOTE — Telephone Encounter (Signed)
 Requesting: oxycodone  10mg  Contract: 06/27/22 UDS: 12/25/23 Last Visit: 12/13/23 Next Visit: None Last Refill: 04/04/24 #180 and 0RF   Pt sig: 2 tab q4h prn  Please Advise

## 2024-04-16 NOTE — Telephone Encounter (Unsigned)
 Copied from CRM [id number]. Topic: Clinical - Medication Refill >> Apr 16, 2024 12:38 PM Harlene ORN wrote: Medication: Oxycodone  HCl 10 MG TABS  Has the patient contacted their pharmacy? Yes (Agent: If no, request that the patient contact the pharmacy for the refill. If patient does not wish to contact the pharmacy document the reason why and proceed with request.) (Agent: If yes, when and what did the pharmacy advise?)  This is the patient's preferred pharmacy:  [facility] - , [place] - [address] [facility] [place] [address] Phone: [phone] Fax: [phone]  Is this the correct pharmacy for this prescription? Yes If no, delete pharmacy and type the correct one.   Has the prescription been filled recently? Yes  Is the patient out of the medication? Yes  Has the patient been seen for an appointment in the last year OR does the patient have an upcoming appointment? Yes  Can we respond through MyChart? Yes  Agent: Please be advised that Rx refills may take up to 3 business days. We ask that you follow-up with your pharmacy.

## 2024-04-19 MED ORDER — OXYCODONE HCL 10 MG PO TABS: 20.0000 mg | ORAL_TABLET | ORAL | 0 refills | Status: DC | PRN

## 2024-05-01 ENCOUNTER — Other Ambulatory Visit: Payer: Self-pay | Admitting: Family Medicine

## 2024-05-01 NOTE — Telephone Encounter (Signed)
Will call in AM

## 2024-05-01 NOTE — Telephone Encounter (Signed)
 Copied from CRM #[id number]. Topic: Clinical - Medication Refill >> May 01, 2024  3:07 PM Taleah C wrote: Medication: oxycodone   Has the patient contacted their pharmacy? Yes Pt stated she has to contact the office because they won't refill it.   This is the patient's preferred pharmacy:  [facility] - [place], [place] - [address] [facility] [place] [address] Phone: [phone] Fax: [phone]   Is this the correct pharmacy for this prescription? Yes If no, delete pharmacy and type the correct one.   Has the prescription been filled recently? Yes  Is the patient out of the medication? No  Has the patient been seen for an appointment in the last year OR does the patient have an upcoming appointment? No  Can we respond through MyChart? Yes  Agent: Please be advised that Rx refills may take up to 3 business days. We ask that you follow-up with your pharmacy.

## 2024-05-02 NOTE — Telephone Encounter (Signed)
 Requesting: oxycodone  10mg   Contract:06/27/22 UDS: 06/27/22 Last Visit: 12/13/23 Next Visit: None Last Refill: 04/19/24 #180 and 0RF (15 day supply)  Please Advise

## 2024-05-02 NOTE — Telephone Encounter (Signed)
 Spoke w/ [facility]- she picked up the prescription for oxycodone  that was sent on 04/19/24 on 04/21/24, which was only for 15 day supply. Pt needing another refill.

## 2024-05-05 ENCOUNTER — Other Ambulatory Visit: Payer: Self-pay | Admitting: Family Medicine

## 2024-05-05 ENCOUNTER — Telehealth: Payer: Self-pay | Admitting: Family Medicine

## 2024-05-05 MED ORDER — OXYCODONE HCL 10 MG PO TABS: 20.0000 mg | ORAL_TABLET | ORAL | 0 refills | Status: DC | PRN

## 2024-05-05 NOTE — Telephone Encounter (Signed)
 Requesting: oxycodone  10mg   Contract:06/27/22 UDS: 06/27/22 Last Visit: 12/13/23 Next Visit: None Last Refill: 04/19/24 #180 and 0RF (15 day supply)  Please Advise

## 2024-05-05 NOTE — Telephone Encounter (Signed)
 Copied from CRM #[id number]. Topic: Clinical - Medication Refill >> May 01, 2024  3:07 PM Taleah C wrote: Medication: oxycodone   Has the patient contacted their pharmacy? Yes Pt stated she has to contact the office because they won't refill it.   This is the patient's preferred pharmacy:  [facility] - [place], [place] - [address] [facility] [place] [address] Phone: [phone] Fax: [phone]   Is this the correct pharmacy for this prescription? Yes If no, delete pharmacy and type the correct one.   Has the prescription been filled recently? Yes  Is the patient out of the medication? No  Has the patient been seen for an appointment in the last year OR does the patient have an upcoming appointment? No  Can we respond through MyChart? Yes  Agent: Please be advised that Rx refills may take up to 3 business days. We ask that you follow-up with your pharmacy.

## 2024-05-16 ENCOUNTER — Other Ambulatory Visit: Payer: Self-pay | Admitting: Family Medicine

## 2024-05-16 NOTE — Telephone Encounter (Signed)
 Copied from CRM #[id number]. Topic: Clinical - Medication Refill >> May 16, 2024 12:21 PM Nessti S wrote: Medication: Oxycodone  HCl 10 MG TABS  Has the patient contacted their pharmacy? No (Agent: If no, request that the patient contact the pharmacy for the refill. If patient does not wish to contact the pharmacy document the reason why and proceed with request.) (Agent: If yes, when and what did the pharmacy advise?)  This is the patient's preferred pharmacy:  [facility] - [place], [place] - [address] [place] [address] Phone: [phone] Fax: [phone]  Is this the correct pharmacy for this prescription? Yes If no, delete pharmacy and type the correct one.   Has the prescription been filled recently? Yes  Is the patient out of the medication? No  Has the patient been seen for an appointment in the last year OR does the patient have an upcoming appointment? Yes  Can we respond through MyChart? Yes  Agent: Please be advised that Rx refills may take up to 3 business days. We ask that you follow-up with your pharmacy.

## 2024-05-17 ENCOUNTER — Other Ambulatory Visit: Payer: Self-pay | Admitting: Family Medicine

## 2024-05-17 ENCOUNTER — Other Ambulatory Visit: Payer: Self-pay | Admitting: Family

## 2024-05-17 MED ORDER — CITALOPRAM HYDROBROMIDE 20 MG PO TABS: ORAL_TABLET | ORAL | 1 refills | Status: DC

## 2024-05-19 ENCOUNTER — Other Ambulatory Visit: Payer: Self-pay

## 2024-05-19 MED ORDER — OXYCODONE HCL 10 MG PO TABS: 20.0000 mg | ORAL_TABLET | ORAL | 0 refills | Status: DC | PRN

## 2024-05-19 NOTE — Telephone Encounter (Signed)
 Copied from CRM #[id number]. Topic: Clinical - Medication Question >> May 19, 2024 10:20 AM Shereese L wrote: Reason for CRM: Patient called in for medication refill for Oxycodone  HCl 10 MG TABS and she gets a 15 day supply which runs out tomorrow. She requesting a refill prior to the day she runs out

## 2024-05-19 NOTE — Telephone Encounter (Signed)
 Requesting: Oxycodone  10 MG Contract: 06/27/2022 UDS: 12/25/2023 Last Visit: 12/13/2023 Next Visit:07/31/2024 Last Refill: 05/05/2024  Please Advise

## 2024-05-23 ENCOUNTER — Other Ambulatory Visit: Payer: Self-pay | Admitting: Family Medicine

## 2024-05-23 ENCOUNTER — Other Ambulatory Visit: Payer: Self-pay

## 2024-05-23 DIAGNOSIS — I10 Essential (primary) hypertension: Secondary | ICD-10-CM

## 2024-05-23 MED ORDER — FLUCONAZOLE 150 MG PO TABS: 150.0000 mg | ORAL_TABLET | ORAL | 1 refills | Status: AC

## 2024-05-23 MED ORDER — CITALOPRAM HYDROBROMIDE 20 MG PO TABS: ORAL_TABLET | ORAL | 1 refills | Status: AC

## 2024-05-23 MED ORDER — VENLAFAXINE HCL ER 75 MG PO CP24: ORAL_CAPSULE | ORAL | 1 refills | Status: AC

## 2024-05-23 MED ORDER — LOSARTAN POTASSIUM 50 MG PO TABS: 50.0000 mg | ORAL_TABLET | Freq: Every day | ORAL | 1 refills | Status: AC

## 2024-05-23 MED ORDER — DILTIAZEM HCL ER BEADS 240 MG PO CP24: ORAL_CAPSULE | ORAL | 1 refills | Status: AC

## 2024-05-23 MED ORDER — HYDROCHLOROTHIAZIDE 12.5 MG PO CAPS: ORAL_CAPSULE | ORAL | 1 refills | Status: AC

## 2024-05-23 NOTE — Telephone Encounter (Signed)
 Copied from CRM [id number]. Topic: Clinical - Prescription Issue >> May 23, 2024  3:18 PM Victoria A wrote: Reason for CRM: Patient called and would like for Nurse to call her back regarding citalopram  (CELEXA ) 20 MG tablet please call [phone]

## 2024-05-30 ENCOUNTER — Other Ambulatory Visit: Payer: Self-pay | Admitting: Family Medicine

## 2024-05-30 NOTE — Telephone Encounter (Unsigned)
 Copied from CRM #[id number]. Topic: Clinical - Medication Refill >> May 30, 2024 12:17 PM Amy B wrote: Medication: Oxycodone  HCl 10 MG TABS  Has the patient contacted their pharmacy? No (Agent: If no, request that the patient contact the pharmacy for the refill. If patient does not wish to contact the pharmacy document the reason why and proceed with request.) (Agent: If yes, when and what did the pharmacy advise?)  This is the patient's preferred pharmacy:  [facility] - [place], [place] - [address]   Is this the correct pharmacy for this prescription? Yes If no, delete pharmacy and type the correct one.   Has the prescription been filled recently? No  Is the patient out of the medication? Yes  Has the patient been seen for an appointment in the last year OR does the patient have an upcoming appointment? Yes  Can we respond through MyChart? Yes  Agent: Please be advised that Rx refills may take up to 3 business days. We ask that you follow-up with your pharmacy.

## 2024-06-01 MED ORDER — OXYCODONE HCL 10 MG PO TABS: 20.0000 mg | ORAL_TABLET | ORAL | 0 refills | Status: DC | PRN

## 2024-06-13 ENCOUNTER — Telehealth: Payer: Self-pay

## 2024-06-13 NOTE — Telephone Encounter (Signed)
 Refills sent on 06/01/24 for 180 tablets

## 2024-06-13 NOTE — Telephone Encounter (Signed)
 Copied from CRM #[id number]. Topic: Clinical - Medication Refill >> Jun 13, 2024  2:38 PM Emylou G wrote: Medication: Oxycodone  HCl 10 MG TABS  Has the patient contacted their pharmacy? No (Agent: If no, request that the patient contact the pharmacy for the refill. If patient does not wish to contact the pharmacy document the reason why and proceed with request.) (Agent: If yes, when and what did the pharmacy advise?)  This is the patient's preferred pharmacy:    [facility] - [place], [place] - [address] [facility] [place] [address] Phone: [phone] Fax: [phone]  Is this the correct pharmacy for this prescription? Yes If no, delete pharmacy and type the correct one.   Has the prescription been filled recently? No  Is the patient out of the medication? Yes  Has the patient been seen for an appointment in the last year OR does the patient have an upcoming appointment? Yes  Can we respond through MyChart? Yes  Agent: Please be advised that Rx refills may take up to 3 business days. We ask that you follow-up with your pharmacy.

## 2024-06-16 NOTE — Telephone Encounter (Signed)
 Patient is calling to get an update on the oxycodone  medication. Advised that it was sent to Dr Domenica and that we are just waiting for her to send over. Patient stated that was okay.

## 2024-06-17 ENCOUNTER — Other Ambulatory Visit: Payer: Self-pay | Admitting: Family

## 2024-06-17 MED ORDER — OXYCODONE HCL 10 MG PO TABS: 20.0000 mg | ORAL_TABLET | ORAL | 0 refills | Status: DC | PRN

## 2024-06-17 NOTE — Telephone Encounter (Unsigned)
 Copied from CRM #[id number]. Topic: Clinical - Prescription Issue >> Jun 17, 2024  1:15 PM Mesmerise C wrote: Reason for CRM: [facility]one  HCl 10 MG TABS was sent to them it usually gets sent to [facility] refill request on 1/23 shows preferred pharmacy as [facility] as well, states he will put it on hold until he knows to fill the prescription or not

## 2024-06-17 NOTE — Telephone Encounter (Unsigned)
 Copied from CRM #[id number]. Topic: Clinical - Medication Refill >> Jun 13, 2024  2:38 PM Emylou G wrote: Medication: Oxycodone  HCl 10 MG TABS  Has the patient contacted their pharmacy? No (Agent: If no, request that the patient contact the pharmacy for the refill. If patient does not wish to contact the pharmacy document the reason why and proceed with request.) (Agent: If yes, when and what did the pharmacy advise?)  This is the patient's preferred pharmacy:    [facility] - [place], [place] - [address] [facility] [place] [address] Phone: [phone] Fax: [phone]  Is this the correct pharmacy for this prescription? Yes If no, delete pharmacy and type the correct one.   Has the prescription been filled recently? No  Is the patient out of the medication? Yes  Has the patient been seen for an appointment in the last year OR does the patient have an upcoming appointment? Yes  Can we respond through MyChart? Yes  Agent: Please be advised that Rx refills may take up to 3 business days. We ask that you follow-up with your pharmacy. >> Jun 17, 2024  9:44 AM Roselie BROCKS wrote: Patient calling for update on a refill she put in on 06-13-24

## 2024-06-18 ENCOUNTER — Ambulatory Visit: Payer: Self-pay | Admitting: Family Medicine

## 2024-06-18 NOTE — Telephone Encounter (Unsigned)
 Copied from CRM #[id number]. Topic: Clinical - Medication Refill >> Jun 13, 2024  2:38 PM Emylou G wrote: Medication: Oxycodone  HCl 10 MG TABS  Has the patient contacted their pharmacy? No (Agent: If no, request that the patient contact the pharmacy for the refill. If patient does not wish to contact the pharmacy document the reason why and proceed with request.) (Agent: If yes, when and what did the pharmacy advise?)  This is the patient's preferred pharmacy:    [facility] - [place], [place] - [address] [facility] [place] [address] Phone: [phone] Fax: [phone]  Is this the correct pharmacy for this prescription? Yes If no, delete pharmacy and type the correct one.   Has the prescription been filled recently? No  Is the patient out of the medication? Yes  Has the patient been seen for an appointment in the last year OR does the patient have an upcoming appointment? Yes  Can we respond through MyChart? Yes  Agent: Please be advised that Rx refills may take up to 3 business days. We ask that you follow-up with your pharmacy. >> Jun 17, 2024  4:54 PM Zebedee SAUNDERS wrote: Pt calling stated pharmacy has not received medication refill. Pt was seen on 12/13/2023. Pt would like a call back at [phone].  >> Jun 17, 2024  9:44 AM Roselie BROCKS wrote: Patient calling for update on a refill she put in on 06-13-24
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
## 2024-06-19 ENCOUNTER — Telehealth: Payer: Self-pay

## 2024-06-19 MED ORDER — OXYCODONE HCL 10 MG PO TABS: 20.0000 mg | ORAL_TABLET | ORAL | 0 refills | Status: DC | PRN

## 2024-06-19 NOTE — Telephone Encounter (Signed)
 [facility] stated that [facility] and [facility].

## 2024-06-19 NOTE — Telephone Encounter (Signed)
 Copied from CRM #[id number]. Topic: Clinical - Medication Question >> Jun 19, 2024 11:46 AM [name] wrote: Reason for CRM: Patient would like NP Elaina Cara to call her when she's available please. Callback number [phone]

## 2024-06-20 ENCOUNTER — Other Ambulatory Visit: Payer: Self-pay | Admitting: Family

## 2024-06-20 MED ORDER — OXYCODONE HCL 10 MG PO TABS: 20.0000 mg | ORAL_TABLET | ORAL | 0 refills | Status: AC | PRN

## 2024-07-31 ENCOUNTER — Ambulatory Visit: Admitting: Family Medicine

## 2024-08-11 ENCOUNTER — Ambulatory Visit: Admitting: Family Medicine
